# Patient Record
Sex: Female | Born: 1945
Health system: Southern US, Community
[De-identification: ages and names within clinical notes are randomized; demographics above are authoritative.]

## PROBLEM LIST (undated history)

## (undated) DIAGNOSIS — I10 Essential (primary) hypertension: Secondary | ICD-10-CM

## (undated) DIAGNOSIS — E785 Hyperlipidemia, unspecified: Secondary | ICD-10-CM

## (undated) DIAGNOSIS — M810 Age-related osteoporosis without current pathological fracture: Secondary | ICD-10-CM

## (undated) HISTORY — PX: ABDOMINAL HYSTERECTOMY: SHX81

## (undated) HISTORY — DX: Essential (primary) hypertension: I10

## (undated) HISTORY — DX: Hyperlipidemia, unspecified: E78.5

## (undated) HISTORY — DX: Age-related osteoporosis without current pathological fracture: M81.0

---

## 1999-11-05 ENCOUNTER — Encounter: Payer: Self-pay | Admitting: Family Medicine

## 1999-11-05 ENCOUNTER — Encounter: Admission: RE | Admit: 1999-11-05 | Discharge: 1999-11-05 | Payer: Self-pay | Admitting: Family Medicine

## 2001-02-09 ENCOUNTER — Encounter: Payer: Self-pay | Admitting: Surgery

## 2001-02-09 ENCOUNTER — Ambulatory Visit (HOSPITAL_COMMUNITY): Admission: RE | Admit: 2001-02-09 | Discharge: 2001-02-09 | Payer: Self-pay | Admitting: Surgery

## 2007-08-06 ENCOUNTER — Encounter: Admission: RE | Admit: 2007-08-06 | Discharge: 2007-08-06 | Payer: Self-pay | Admitting: Obstetrics and Gynecology

## 2016-09-25 ENCOUNTER — Encounter: Payer: Self-pay | Admitting: Family Medicine

## 2016-09-25 ENCOUNTER — Ambulatory Visit (INDEPENDENT_AMBULATORY_CARE_PROVIDER_SITE_OTHER): Payer: Medicare Other | Admitting: Family Medicine

## 2016-09-25 VITALS — BP 150/100 | HR 72 | Wt 173.0 lb

## 2016-09-25 DIAGNOSIS — I10 Essential (primary) hypertension: Secondary | ICD-10-CM

## 2016-09-25 DIAGNOSIS — Z7689 Persons encountering health services in other specified circumstances: Secondary | ICD-10-CM

## 2016-09-25 HISTORY — DX: Essential (primary) hypertension: I10

## 2016-09-25 LAB — CBC WITH DIFFERENTIAL/PLATELET
Basophils Absolute: 0 cells/uL (ref 0–200)
Basophils Relative: 0 %
Eosinophils Absolute: 141 cells/uL (ref 15–500)
Eosinophils Relative: 3 %
HCT: 43.1 % (ref 35.0–45.0)
Hemoglobin: 14.2 g/dL (ref 11.7–15.5)
Lymphocytes Relative: 28 %
Lymphs Abs: 1316 cells/uL (ref 850–3900)
MCH: 26.8 pg — ABNORMAL LOW (ref 27.0–33.0)
MCHC: 32.9 g/dL (ref 32.0–36.0)
MCV: 81.5 fL (ref 80.0–100.0)
MPV: 11.5 fL (ref 7.5–12.5)
Monocytes Absolute: 188 cells/uL — ABNORMAL LOW (ref 200–950)
Monocytes Relative: 4 %
Neutro Abs: 3055 cells/uL (ref 1500–7800)
Neutrophils Relative %: 65 %
Platelets: 186 10*3/uL (ref 140–400)
RBC: 5.29 MIL/uL — ABNORMAL HIGH (ref 3.80–5.10)
RDW: 14.6 % (ref 11.0–15.0)
WBC: 4.7 10*3/uL (ref 4.0–10.5)

## 2016-09-25 LAB — COMPLETE METABOLIC PANEL WITH GFR
ALT: 6 U/L (ref 6–29)
AST: 11 U/L (ref 10–35)
Albumin: 4.4 g/dL (ref 3.6–5.1)
Alkaline Phosphatase: 57 U/L (ref 33–130)
BUN: 14 mg/dL (ref 7–25)
CO2: 27 mmol/L (ref 20–31)
Calcium: 11.1 mg/dL — ABNORMAL HIGH (ref 8.6–10.4)
Chloride: 107 mmol/L (ref 98–110)
Creat: 1.09 mg/dL — ABNORMAL HIGH (ref 0.60–0.93)
GFR, Est African American: 59 mL/min — ABNORMAL LOW (ref >=60)
GFR, Est Non African American: 51 mL/min — ABNORMAL LOW (ref >=60)
Glucose, Bld: 93 mg/dL (ref 65–99)
Potassium: 4.2 mmol/L (ref 3.5–5.3)
Sodium: 144 mmol/L (ref 135–146)
Total Bilirubin: 0.9 mg/dL (ref 0.2–1.2)
Total Protein: 7.6 g/dL (ref 6.1–8.1)

## 2016-09-25 LAB — POCT URINALYSIS DIPSTICK
Bilirubin, UA: NEGATIVE
Blood, UA: NEGATIVE
Glucose, UA: NEGATIVE
Ketones, UA: NEGATIVE
Leukocytes, UA: NEGATIVE
Nitrite, UA: NEGATIVE
Protein, UA: NEGATIVE
Spec Grav, UA: >=1.03
Urobilinogen, UA: NEGATIVE
pH, UA: 6

## 2016-09-25 MED ORDER — LISINOPRIL-HYDROCHLOROTHIAZIDE 10-12.5 MG PO TABS: 1.0000 | ORAL_TABLET | Freq: Every day | ORAL | 1 refills | Status: DC

## 2016-09-25 NOTE — Progress Notes (Signed)
   Subjective:    Patient ID: Tracy Rowe, female    DOB: 11-03-1945, 71 y.o.   MRN: [id number]  HPI Chief Complaint  Patient presents with  . new pt    new pt, get established.    She is new to the practice and here to establish care.   Previous medical care: no PCP in more than 10 years.  States she saw Dr. Doylene Canard, cardiologist, in the distant past for HTN.  States she has a history of HTN and took medication for approximately 6 years and then stopped for no particular reason. No medication in the past 5-6 years.   Denies fever, chills, unexplained, fatigue, headaches, dizziness, chest pain, palpitations, shortness of breath, orthopnea, cough, abdominal, N/V/D, constipation, urinary symptoms. No LE edema.   Surgeries: hysterectomy for heavy periods in her 105s.   Denies smoking, alcohol use, drug use.   Married and lives with spouse. She is his caregiver.  Never had children. 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 Stopped working last year to take care of her husband.   Colonoscopy: never Mammogram: 17-18 years ago.   Eye exam: 2016  Reviewed allergies, medications, past medical, surgical, and social history.   Review of Systems Pertinent positives and negatives in the history of present illness.     Objective:   Physical Exam BP (!) 150/100   Pulse 72   Wt 173 lb (78.5 kg)   SpO2 98%  Alert and in no distress.  Pharyngeal area is normal. Neck is supple without adenopathy or thyromegaly. Cardiac exam shows a regular sinus rhythm without murmurs or gallops. Lungs are clear to auscultation. Extremities without edema, pulses normal.       Assessment & Plan:  Hypertension, unspecified type - Plan: CBC with Differential/Platelet, COMPLETE METABOLIC PANEL WITH GFR, EKG 12-Lead, POCT urinalysis dipstick, lisinopril-hydrochlorothiazide (PRINZIDE,ZESTORETIC) 10-12.5 MG tablet  Encounter to establish care  Urinalysis negative.  Start taking medication and watch  sodium intake. DASH diet provided. Start walking for exercise.  She will check her BP at home daily for the next 2 weeks and call me with her readings.  She is overdue on health maintenance such as mammogram, DEXA and colonoscopy. She has never had a medicare wellness visit.  She will call and schedule an AWV and fasting lipids in the next month.  Labs ordered today and will follow up pending results.

## 2016-09-25 NOTE — Patient Instructions (Addendum)
Start blood pressure medication and check your blood pressures daily for the next 2 weeks and call me with your readings.  Follow up in 1 month or sooner for high blood pressure.   Schedule an annual wellness visit and fasting lipids within the next month.    DASH Eating Plan DASH stands for "Dietary Approaches to Stop Hypertension." The DASH eating plan is a healthy eating plan that has been shown to reduce high blood pressure (hypertension). Additional health benefits may include reducing the risk of type 2 diabetes mellitus, heart disease, and stroke. The DASH eating plan may also help with weight loss. What do I need to know about the DASH eating plan? For the DASH eating plan, you will follow these general guidelines:  Choose foods with less than 150 milligrams of sodium per serving (as listed on the food label).  Use salt-free seasonings or herbs instead of table salt or sea salt.  Check with your health care provider or pharmacist before using salt substitutes.  Eat lower-sodium products. These are often labeled as "low-sodium" or "no salt added."  Eat fresh foods. Avoid eating a lot of canned foods.  Eat more vegetables, fruits, and low-fat dairy products.  Choose whole grains. Look for the word "whole" as the first word in the ingredient list.  Choose fish and skinless chicken or [place] more often than red meat. Limit fish, poultry, and meat to 6 oz (170 g) each day.  Limit sweets, desserts, sugars, and sugary drinks.  Choose heart-healthy fats.  Eat more home-cooked food and less restaurant, buffet, and fast food.  Limit fried foods.  Do not fry foods. Cook foods using methods such as baking, boiling, grilling, and broiling instead.  When eating at a restaurant, ask that your food be prepared with less salt, or no salt if possible. What foods can I eat? Seek help from a dietitian for individual calorie needs. Grains  Whole grain or whole wheat bread. Brown rice.  Whole grain or whole wheat pasta. Quinoa, bulgur, and whole grain cereals. Low-sodium cereals. Corn or whole wheat flour tortillas. Whole grain cornbread. Whole grain crackers. Low-sodium crackers. Vegetables  Fresh or frozen vegetables (raw, steamed, roasted, or grilled). Low-sodium or reduced-sodium tomato and vegetable juices. Low-sodium or reduced-sodium tomato sauce and paste. Low-sodium or reduced-sodium canned vegetables. Fruits  All fresh, canned (in natural juice), or frozen fruits. Meat and Other Protein Products  Ground beef (85% or leaner), grass-fed beef, or beef trimmed of fat. Skinless chicken or [place]. Ground chicken or [place]. Pork trimmed of fat. All fish and seafood. Eggs. Dried beans, peas, or lentils. Unsalted nuts and seeds. Unsalted canned beans. Dairy  Low-fat dairy products, such as skim or 1% milk, 2% or reduced-fat cheeses, low-fat ricotta or cottage cheese, or plain low-fat yogurt. Low-sodium or reduced-sodium cheeses. Fats and Oils  Tub margarines without trans fats. Light or reduced-fat mayonnaise and salad dressings (reduced sodium). Avocado. Safflower, olive, or canola oils. Natural peanut or almond butter. Other  Unsalted popcorn and pretzels. The items listed above may not be a complete list of recommended foods or beverages. Contact your dietitian for more options.  What foods are not recommended? Grains  White bread. White pasta. White rice. Refined cornbread. Bagels and croissants. Crackers that contain trans fat. Vegetables  Creamed or fried vegetables. Vegetables in a cheese sauce. Regular canned vegetables. Regular canned tomato sauce and paste. Regular tomato and vegetable juices. Fruits  Canned fruit in light or heavy syrup. Fruit juice. Meat  and Other Protein Products  Fatty cuts of meat. Ribs, chicken wings, bacon, sausage, bologna, salami, chitterlings, fatback, hot dogs, bratwurst, and packaged luncheon meats. Salted nuts and seeds. Canned beans  with salt. Dairy  Whole or 2% milk, cream, half-and-half, and cream cheese. Whole-fat or sweetened yogurt. Full-fat cheeses or blue cheese. Nondairy creamers and whipped toppings. Processed cheese, cheese spreads, or cheese curds. Condiments  Onion and garlic salt, seasoned salt, table salt, and sea salt. Canned and packaged gravies. Worcestershire sauce. Tartar sauce. Barbecue sauce. Teriyaki sauce. Soy sauce, including reduced sodium. Steak sauce. Fish sauce. Oyster sauce. Cocktail sauce. Horseradish. Ketchup and mustard. Meat flavorings and tenderizers. Bouillon cubes. Hot sauce. Tabasco sauce. Marinades. Taco seasonings. Relishes. Fats and Oils  Butter, stick margarine, lard, shortening, ghee, and bacon fat. Coconut, palm kernel, or palm oils. Regular salad dressings. Other  Pickles and olives. Salted popcorn and pretzels. The items listed above may not be a complete list of foods and beverages to avoid. Contact your dietitian for more information.  Where can I find more information? National Heart, Lung, and Blood Institute: travelstabloid.com This information is not intended to replace advice given to you by your health care provider. Make sure you discuss any questions you have with your health care provider. Document Released: 07/31/2011 Document Revised: 01/17/2016 Document Reviewed: 06/15/2013 Elsevier Interactive Patient Education  2017 [place].  Menopause Menopause is the normal time of life when menstrual periods stop completely. Menopause is complete when you have missed 12 consecutive menstrual periods. It usually occurs between the ages of 11 years and 23 years. Very rarely does a woman develop menopause before the age of 14 years. At menopause, your ovaries stop producing the female hormones estrogen and progesterone. This can cause undesirable symptoms and also affect your health. Sometimes the symptoms may occur 4-5 years before the menopause  begins. There is no relationship between menopause and:  Oral contraceptives.  Number of children you had.  Race.  The age your menstrual periods started (menarche). Heavy smokers and very thin women may develop menopause earlier in life. What are the causes?  The ovaries stop producing the female hormones estrogen and progesterone. Other causes include:  Surgery to remove both ovaries.  The ovaries stop functioning for no known reason.  Tumors of the pituitary gland in the brain.  Medical disease that affects the ovaries and hormone production.  Radiation treatment to the abdomen or pelvis.  Chemotherapy that affects the ovaries. What are the signs or symptoms?  Hot flashes.  Night sweats.  Decrease in sex drive.  Vaginal dryness and thinning of the vagina causing painful intercourse.  Dryness of the skin and developing wrinkles.  Headaches.  Tiredness.  Irritability.  Memory problems.  Weight gain.  Bladder infections.  Hair growth of the face and chest.  Infertility. More serious symptoms include:  Loss of bone (osteoporosis) causing breaks (fractures).  Depression.  Hardening and narrowing of the arteries (atherosclerosis) causing heart attacks and strokes. How is this diagnosed?  When the menstrual periods have stopped for 12 straight months.  Physical exam.  Hormone studies of the blood. How is this treated? There are many treatment choices and nearly as many questions about them. The decisions to treat or not to treat menopausal changes is an individual choice made with your health care provider. Your health care provider can discuss the treatments with you. Together, you can decide which treatment will work best for you. Your treatment choices may include:  Hormone therapy (  estrogen and progesterone).  Non-hormonal medicines.  Treating the individual symptoms with medicine (for example antidepressants for depression).  Herbal  medicines that may help specific symptoms.  Counseling by a psychiatrist or psychologist.  Group therapy.  Lifestyle changes including:  Eating healthy.  Regular exercise.  Limiting caffeine and alcohol.  Stress management and meditation.  No treatment. Follow these instructions at home:  Take the medicine your health care provider gives you as directed.  Get plenty of sleep and rest.  Exercise regularly.  Eat a diet that contains calcium (good for the bones) and soy products (acts like estrogen hormone).  Avoid alcoholic beverages.  Do not smoke.  If you have hot flashes, dress in layers.  Take supplements, calcium, and vitamin D to strengthen bones.  You can use over-the-counter lubricants or moisturizers for vaginal dryness.  Group therapy is sometimes very helpful.  Acupuncture may be helpful in some cases. Contact a health care provider if:  You are not sure you are in menopause.  You are having menopausal symptoms and need advice and treatment.  You are still having menstrual periods after age 75 years.  You have pain with intercourse.  Menopause is complete (no menstrual period for 12 months) and you develop vaginal bleeding.  You need a referral to a specialist (gynecologist, psychiatrist, or psychologist) for treatment. Get help right away if:  You have severe depression.  You have excessive vaginal bleeding.  You fell and think you have a broken bone.  You have pain when you urinate.  You develop leg or chest pain.  You have a fast pounding heart beat (palpitations).  You have severe headaches.  You develop vision problems.  You feel a lump in your breast.  You have abdominal pain or severe indigestion. This information is not intended to replace advice given to you by your health care provider. Make sure you discuss any questions you have with your health care provider. Document Released: 11/01/2003 Document Revised: 01/17/2016  Document Reviewed: 03/10/2013 Elsevier Interactive Patient Education  2017 [employer].

## 2016-10-23 ENCOUNTER — Encounter: Payer: Self-pay | Admitting: Family Medicine

## 2016-10-23 ENCOUNTER — Ambulatory Visit (INDEPENDENT_AMBULATORY_CARE_PROVIDER_SITE_OTHER): Payer: Medicare Other | Admitting: Family Medicine

## 2016-10-23 VITALS — BP 130/72 | HR 74 | Ht 68.5 in | Wt 168.0 lb

## 2016-10-23 DIAGNOSIS — Z13220 Encounter for screening for lipoid disorders: Secondary | ICD-10-CM | POA: Diagnosis not present

## 2016-10-23 DIAGNOSIS — Z23 Encounter for immunization: Secondary | ICD-10-CM

## 2016-10-23 DIAGNOSIS — Z206 Contact with and (suspected) exposure to human immunodeficiency virus [HIV]: Secondary | ICD-10-CM

## 2016-10-23 DIAGNOSIS — Z1159 Encounter for screening for other viral diseases: Secondary | ICD-10-CM | POA: Diagnosis not present

## 2016-10-23 DIAGNOSIS — Z1231 Encounter for screening mammogram for malignant neoplasm of breast: Secondary | ICD-10-CM

## 2016-10-23 DIAGNOSIS — I10 Essential (primary) hypertension: Secondary | ICD-10-CM | POA: Diagnosis not present

## 2016-10-23 DIAGNOSIS — Z1211 Encounter for screening for malignant neoplasm of colon: Secondary | ICD-10-CM | POA: Diagnosis not present

## 2016-10-23 DIAGNOSIS — Z0000 Encounter for general adult medical examination without abnormal findings: Secondary | ICD-10-CM

## 2016-10-23 DIAGNOSIS — E2839 Other primary ovarian failure: Secondary | ICD-10-CM | POA: Diagnosis not present

## 2016-10-23 DIAGNOSIS — Z113 Encounter for screening for infections with a predominantly sexual mode of transmission: Secondary | ICD-10-CM

## 2016-10-23 DIAGNOSIS — Z1239 Encounter for other screening for malignant neoplasm of breast: Secondary | ICD-10-CM

## 2016-10-23 LAB — LIPID PANEL
CHOL/HDL RATIO: 5.1 ratio — AB (ref <5.0)
Cholesterol: 190 mg/dL (ref <200)
HDL: 37 mg/dL — AB (ref >50)
LDL Cholesterol: 132 mg/dL — ABNORMAL HIGH (ref <100)
TRIGLYCERIDES: 104 mg/dL (ref <150)
VLDL: 21 mg/dL (ref <30)

## 2016-10-23 NOTE — Progress Notes (Signed)
Tracy Rowe is a 71 y.o. female who presents for annual wellness visit and follow-up on chronic medical conditions.  She has the following concerns: No concerns today.   Her husband has HIV. Will need this checked today.   Immunization History  Administered Date(s) Administered  . Influenza, High Dose Seasonal PF 10/23/2016  . Pneumococcal Conjugate-13 10/23/2016   Last Pap smear: hysterectomy Last mammogram: 2008 Last colonoscopy: never  Last DEXA: 2008 Dentist: many years - has upper dentures.  Ophtho: [facility] in 2017. Prescription glasses.   Exercise: not currently.   Other doctors caring for patient include: NONE    Depression screen:  See questionnaire below.  Depression screen PHQ 2/9 10/23/2016  Decreased Interest 0  Down, Depressed, Hopeless 0  PHQ - 2 Score 0    Fall Risk Screen: see questionnaire below. Fall Risk  10/23/2016  Falls in the past year? No    ADL screen:  See questionnaire below Functional Status Survey: Is the patient deaf or have difficulty hearing?: No Does the patient have difficulty seeing, even when wearing glasses/contacts?: No Does the patient have difficulty concentrating, remembering, or making decisions?: No Does the patient have difficulty walking or climbing stairs?: No Does the patient have difficulty dressing or bathing?: No Does the patient have difficulty doing errands alone such as visiting a doctor's office or shopping?: No   End of Life Discussion:  Patient does not have  a living will and medical power of attorney. Paperwork given and will take home to review. Answered questions. Her niece will be her designated HPOA.   Review of Systems Constitutional: -fever, -chills, -sweats, -unexpected weight change, -anorexia, -fatigue Allergy: -sneezing, -itching, -congestion Dermatology: denies changing moles, rash, lumps, new worrisome lesions ENT: -runny nose, -ear pain, -sore throat, -hoarseness, -sinus pain, -teeth pain,  -tinnitus, -hearing loss Cardiology:  -chest pain, -palpitations, -edema, -orthopnea, -paroxysmal nocturnal dyspnea Respiratory: -cough, -shortness of breath, -dyspnea on exertion, -wheezing, -hemoptysis Gastroenterology: -abdominal pain, -nausea, -vomiting, -diarrhea, -constipation, -blood in stool, -changes in bowel movement, -dysphagia Hematology: -bleeding or bruising problems Musculoskeletal: -arthralgias, -myalgias, -joint swelling, -back pain, -neck pain, -cramping, -gait changes Ophthalmology: -vision changes, -eye redness, -itching, -discharge Urology: -dysuria, -difficulty urinating, -hematuria, -urinary frequency, -urgency, incontinence Neurology: -headache, -weakness, -tingling, -numbness, -speech abnormality, -memory loss, -falls, -dizziness Psychology:  -depressed mood, -agitation, -sleep problems    PHYSICAL EXAM:  BP 130/72   Pulse 74   Ht 5' 8.5" (1.74 m)   Wt 168 lb (76.2 kg)   BMI 25.17 kg/m   General Appearance: Alert, cooperative, no distress, appears stated age Head: Normocephalic, without obvious abnormality, atraumatic Eyes: PERRL, conjunctiva/corneas clear, EOM's intact, fundi benign Ears: Normal TM's and external ear canals Nose: Nares normal, mucosa normal, no drainage or sinus  tenderness Throat: Lips, mucosa, and tongue normal; teeth and gums normal Neck: Supple, no lymphadenopathy; thyroid: no enlargement/tenderness/nodules; no carotid bruit or JVD Back: Spine nontender, no curvature, ROM normal, no CVA tenderness Lungs: Clear to auscultation bilaterally without wheezes, rales or ronchi; respirations unlabored Chest Wall: No tenderness or deformity Heart: Regular rate and rhythm, S1 and S2 normal, no murmur, rub or gallop Breast Exam: No tenderness, masses, or nipple discharge or inversion. No axillary lymphadenopathy Abdomen: Soft, non-tender, nondistended, normoactive bowel sounds, no masses, no hepatosplenomegaly Genitalia: Refused.   Pap not  indicated- hysterectomy.  Rectal: refused  Extremities: No clubbing, cyanosis or edema Pulses: 2+ and symmetric all extremities Skin: Skin color, texture, turgor normal, no rashes or lesions Lymph nodes: Cervical,  supraclavicular, and axillary nodes normal Neurologic: CNII-XII intact, normal strength, sensation and gait; reflexes 2+ and symmetric throughout Psych: Normal mood, affect, hygiene and grooming.  ASSESSMENT/PLAN: Medicare annual wellness visit, subsequent  Routine general medical examination at a health care facility - Plan: Lipid panel  Essential hypertension  Screening for colon cancer  Screening for breast cancer - Plan: MM DIGITAL SCREENING BILATERAL  Need for Tdap vaccination  Need for vaccination against Streptococcus pneumoniae - Plan: Pneumococcal conjugate vaccine 13-valent  Need for hepatitis C screening test - Plan: Hepatitis C antibody  History of exposure to HIV - Plan: RPR, HIV antibody  Estrogen deficiency - Plan: VITAMIN D 25 Hydroxy (Vit-D Deficiency, Fractures), DG Bone Density  Screening for STD (sexually transmitted disease) - Plan: RPR  Need for influenza vaccination - Plan: Flu vaccine HIGH DOSE PF  Screening for lipid disorders - Plan: Lipid panel  Discussed that her blood pressure is under control and within goal. Continue taking medication. She appears to be doing well. She is the caregiver for her husband. Her husband is HIV positive and she has not been checked for this in many years. HIV and RPR ordered today.  She has not been sexually active in many years and denies need for additional STD testing.  Discussed safety and health promotion.  Recommend a dental exam since she does still have bottom teeth and has not had a cleaning in years.  Has not had a mammogram or bone density in at least 10 years. These are ordered.  Has never had a colonoscopy and refuses today. She is of average risk and agrees to do the Cologuard test.  One time  Hepatitis C ordered per age group.  Hysterectomy in the past, she thinks it was a total but cannot find documentation on this. Will check vitamin D status.  Flu shot given.  prevnar 13 given.  Tdap prescription sent to pharmacy.  Advance directives given and she will take these home to fill out and return with any questions.  Follow up in 6 months pending lab and other results.   Discussed monthly self breast exams and yearly mammograms; at least 30 minutes of aerobic activity at least 5 days/week and weight-bearing exercise 2x/week; proper sunscreen use reviewed; healthy diet, including goals of calcium and vitamin D intake and alcohol recommendations (less than or equal to 1 drink/day) reviewed; regular seatbelt use; changing batteries in smoke detectors.  Immunization recommendations discussed.  Colonoscopy recommendations reviewed   Medicare Attestation I have personally reviewed: The patient's medical and social history Their use of alcohol, tobacco or illicit drugs Their current medications and supplements The patient's functional ability including ADLs,fall risks, home safety risks, cognitive, and hearing and visual impairment Diet and physical activities Evidence for depression or mood disorders  The patient's weight, height, and BMI have been recorded in the chart.  I have made referrals, counseling, and provided education to the patient based on review of the above and I have provided the patient with a written personalized care plan for preventive services.     Harland Dingwall, NP   10/23/2016

## 2016-10-23 NOTE — Patient Instructions (Addendum)
MEDICARE PREVENTATIVE SERVICES (FEMALE) AND PERSONALIZED PLAN for Tracy Rowe October 23, 2016  CONDITIONS OR RISKS IDENTIFIED TODAY: Need for dental cleaning exam.  Start taking an 81 mg Aspirin nightly for heart health.   Need for advance directives.    SPECIFIC RECOMMENDATIONS: Call and schedule a dental exam.  Get your Tdap shot at your local pharmacy.  Fill out your advance directives and bring them in to go over and scan into the chart.  Call and schedule your bone density and mammogram.  Do the cologuard screening test for colon cancer.    Influenza vaccine: given today Pneumococcal vaccine: prevnar 13 given today. You will get the 2nd one next year.  Shingles vaccine: not done Tdap vaccine: prescription given Colonoscopy: cologuard ordered Mammogram: ordered today Pap smear: N/A  Take a Aspirin 81mg  nightly for heart health Get at least 150 minutes of exercise per week. Eat a healthy diet with fruits and vegetables and limit fried and fatty foods.   Return 6 months or sooner pending labs and other results.     GENERAL RECOMMENDATIONS FOR GOOD HEALTH:  Supplements:  . Take a daily baby Aspirin 81mg  at bedtime for heart health unless you have a history of gastrointestinal bleed, allergy to aspirin, or are already taking higher dose Aspirin or other antiplatelet or blood thinner medication.   . Consume 1200 mg of Calcium daily through dietary calcium or supplement if you are female age 8 or older, or men 14 and older.   Men aged 34-70 should consume 1000 mg of Calcium daily. . Take 600 IU of Vitamin D daily.  Take 800 IU of Calcium daily if you are older than age 42.  . Take a general multivitamin daily.   Healthy diet: Eat a variety of foods, including fruits, vegetables, vegetable protein such as beans, lentils, tofu, and grains, such as rice.  Limit meat or animal protein, but if you eat meat, choose leans cuts such as chicken, fish, or [place].  Drink plenty of water  daily.  Decrease saturated fat in the diet, avoid lots of red meat, processed foods, sweets, fast foods, and fried foods.  Limit salt and caffeine intake.  Exercise: Aerobic exercise helps maintain good heart health. Weight bearing exercise helps keep bones and muscles working strong.  We recommend at least 30-40 minutes of exercise most days of the week.   Fall prevention: Falls are the leading cause of injuries, accidents, and accidental deaths in people over the age of 60. Falling is a real threat to your ability to live on your own.  Causes include poor eyesight or poor hearing, illness, poor lighting, throw rugs, clutter in your home, and medication side effects causing dizziness or balance problems.  Such medications can include medications for depression, sleep problems, high blood pressure, diabetes, and heart conditions.   PREVENTION  Be sure your home is as safe as possible. Here are some tips:  Wear shoes with non-skid soles (not house slippers).   Be sure your home and outside area are well lit.   Use night lights throughout your house, including hallways and stairways.   Remove clutter and clean up spills on floors and walkways.   Remove throw rugs or fasten them to the floor with carpet tape. Tack down carpet edges.   Do not place electrical cords across pathways.   Install grab bars in your bathtub, shower, and toilet area. Towel bars should not be used as a grab bar.   Install handrails  on both sides of stairways.   Do not climb on stools or stepladders. Get someone else to help with jobs that require climbing.   Do not wax your floors at all, or use a non-skid wax.   Repair uneven or unsafe sidewalks, walkways or stairs.   Keep frequently used items within reach.   Be aware of pets so you do not trip.  Get regular check-ups from your doctor, and take good care of yourself:  Have your eyes checked every year for vision changes, cataracts, glaucoma, and other eye  problems. Wear eyeglasses as directed.   Have your hearing checked every 2 years, or anytime you or others think that you cannot hear well. Use hearing aids as directed.   See your caregiver if you have foot pain or corns. Sore feet can contribute to falls.   Let your caregiver know if a medicine is making you feel dizzy or making you lose your balance.   Use a cane, [place], or wheelchair as directed. Use [place] or wheelchair brakes when getting in and out.   When you get up from bed, sit on the side of the bed for 1 to 2 minutes before you stand up. This will give your blood pressure time to adjust, and you will feel less dizzy.   If you need to go to the bathroom often, consider using a bedside commode.  Disease prevention:  If you smoke or chew tobacco, find out from your caregiver how to quit. It can literally save your life, no matter how long you have been a tobacco user. If you do not use tobacco, never begin. Medicare does cover some smoking cessation counseling.  Maintain a healthy diet and normal weight. Increased weight leads to problems with blood pressure and diabetes. We check your height, weight, and BMI as part of your yearly visit.  The Body Mass Index or BMI is a way of measuring how much of your body is fat. Having a BMI above 27 increases the risk of heart disease, diabetes, hypertension, stroke and other problems related to obesity. Your caregiver can help determine your BMI and based on it develop an exercise and dietary program to help you achieve or maintain this important measurement at a healthful level.  High blood pressure causes heart and blood vessel problems.  Persistent high blood pressure should be treated with medicine if weight loss and exercise do not work.  We check your blood pressure as part of your yearly visit.  Avoid drinking alcohol in excess (more than two drinks per day).  Avoid use of street drugs. Do not share needles with anyone. Ask for  professional help if you need assistance or instructions on stopping the use of alcohol, cigarettes, and/or drugs.  Brush your teeth twice a day with fluoride toothpaste, and floss once a day. Good oral hygiene prevents tooth decay and gum disease. The problems can be painful, unattractive, and can cause other health problems. Visit your dentist for a routine oral and dental checkup and preventive care every 6-12 months.   See your eye doctor yearly for routine screening for things like glaucoma.  Look at your skin regularly.  Use a mirror to look at your back. Notify your caregivers of changes in moles, especially if there are changes in shapes, colors, a size larger than a pencil eraser, an irregular border, or development of new moles.  Safety:  Use seatbelts 100% of the time, whether driving or as a passenger.  Use  safety devices such as hearing protection if you work in environments with loud noise or significant background noise.  Use safety glasses when doing any work that could send debris in to the eyes.  Use a helmet if you ride a bike or motorcycle.  Use appropriate safety gear for contact sports.  Talk to your caregiver about gun safety.  Use sunscreen with a SPF (or skin protection factor) of 15 or greater.  Lighter skinned people are at a greater risk of skin cancer. Don't forget to also wear sunglasses in order to protect your eyes from too much damaging sunlight. Damaging sunlight can accelerate cataract formation.   If you have multiple sexual partners, or if you are not in a monogamous relationship, practice safe sex. Use condoms. Condoms are used to help reduce the spread of sexually transmitted infections (or STIs).  Consider an HIV test if you have never been tested.  Consider routine screening for STIs if you have multiple sexual partners.   Keep carbon monoxide and smoke detectors in your home functioning at all times. Change the batteries every 6 months or use a model that  plugs into the wall or is hard wired in.   END OF LIFE PLANNING/ADVANCED DIRECTIVES Advance health-care planning is deciding the kind of care you want at the end of life. While alert competent adults are able to exercise their rights to make health care and financial decisions, problems arise when an individual becomes unconscious, incapacitated, or otherwise unable to communicate or make such decisions. Advance health care directives are the legal documents in which you give written instructions about your choices limited, aggressive or palliative care if, in the future, you cannot speak for yourself.  Advanced directives include the following: [place] allows you to appoint someone to act as your health care agent to make health care decisions for you should it be determined by your health care provider that you are no longer able to make these decisions for yourself.  A Living Will is a legal document in which you can declare that under certain conditions you desire your life not be prolonged by extraordinary or artificial means during your last illness or when you are near death. We can provide you with sample advanced directives, you can get an attorney to prepare these for you, or you can visit [place] Secretary of State's website for additional information and resources at [email or web address]  Further, I recommend you have an attorney prepare a Will and Durable Power of Attorney if you haven't done so already.  Please get [place] a copy of your health care Advanced Directives.   PREVENTATIV E CARE RECOMMENDATIONS:  Vaccinations: We recommend the following vaccinations as part of your preventative care:  Pneumococcal vaccine is recommended to protect against certain types of pneumonia.  This is normally recommended for adults age 82 or older once, or up to every 5 years for those at high risk.  The vaccine is also recommended for adults younger than 73 years  old with certain underlying conditions that make them high risk for pneumonia.  Influenza vaccine is recommended to protect against seasonal influenza or "the flu." Influenza is a serious disease that can lead to hospitalization and sometimes even death. Traditional flu vaccines (called trivalent vaccines) are made to protect against three flu viruses; an influenza A (H1N1) virus, an influenza A (H3N2) virus, and an influenza B virus. In addition, there are flu vaccines made to protect against four flu viruses (  called "quadrivalent" vaccines). These vaccines protect against the same viruses as the trivalent vaccine and an additional B virus.  We recommend the high dose influenza vaccine to those 65 years and older.  Hepatitis B vaccine to protect against a form of infection of the liver by a virus acquired from blood or body fluids, particularly for high risk groups.  Td or Tdap vaccine to protect against Tetanus, diphtheria and pertussis which can be very serious.  These diseases are caused by bacteria.  Diphtheria and pertussis are spread from person to person through coughing or sneezing.  Tetanus enters the body through cuts, scratches, or wounds.  Tetanus (Lockjaw) causes painful muscle tightening and stiffness, usually all over the body.  Diphtheria can cause a thick coating to form in the back of the throat.  It can lead to breathing problems, paralysis, heart failure, and death.  Pertussis (Whooping Cough) causes severe coughing spells, which can cause difficulty breathing, vomiting and disturbed sleep.  Td or Tdap is usually given every 10 years.  Shingles vaccine to protect against Varicella Zoster if you are older than age 108, or younger than 73 years old with certain underlying illness.    Cancer Screening: Most routine colon cancer screening begins at the age of 44.  Subsequent colonoscopies are performed either every 5-10 years for normal screening, or every 2-5 years for higher risks  patients, up until age 15 years of age. Annual screening is done with easy to use take-home tests to check for hidden blood in the stool called hemoccult tests.  Sigmoidoscopy or colonoscopy can detect the earliest forms of colon cancer and is life saving. These tests use a small camera at the end of a tube to directly examine the colon.   Pelvic Exam and Pap Smear: Pelvic exams and pap smears are performed routinely to evaluate for abnormalities as well as cancers including cervical and vaginal cancers.  This is generally performed every 2-3 years for most women, or more frequently for higher risk patients.  Mammograms: Mammograms are used to screen for breast cancer.  Medicare covers baseline screening once from ages 30-41 years old, but will cover mammograms yearly for those 40 years and older.  In accordance with other guidelines, you may not need a mammogram every year though.  The decision on how frequently you need a mammogram should be discussed with you medical provider.    Osteoporosis Screening: Screening for osteoporosis usually begins at age 62 for women, and can be done as frequent as every 2 years.  However, women or men with higher risk of osteoporosis may be screened earlier than age 5.  Osteoporosis or low bone mass is diminished bone strength from alterations in bone architecture leading to bone fragility and increased fracture risk.     Cardiovascular Screening: Fat and cholesterol leaves deposits in your arteries that can block them. This causes heart disease and vessel disease elsewhere in your body.  If your cholesterol is found to be high, or if you have heart disease or certain other medical conditions, then you may need to have your cholesterol monitored frequently and be treated with medication. Cardiovascular screening in the form of lab tests for cholesterol, HDL and triglycerides can be done every 5 years.  A screening electrocardiogram can be done as part of the Welcome to  Medicare physical.  Diabetes Screening: Diabetes screening can be done at least every 3 years for those with risk factors,  or every 6-60months for prediabetic patients.  Screening includes fasting blood sugar test or glucose tolerance test.  Risk factors include hypertension, dyslipidemia, obesity, previously abnormal glucose tests, family history of diabetes, age 33 years or older, and history of gestations diabetes.   AAA (abdominal aortic aneurysm) Screening: Medicare allows for a one time ultrasound to screen for abdominal aortic aneurysm if done as a referral as part of the Welcome to Medicare exam.  Men eligible for this screening include those men between age 54-21 years of age who have smoked at least 100 cigarettes in his lifetime and/or has a family history of AAA.  HIV Screening:  Medicare allows for yearly screening for patients at high risk for contracting HIV disease.

## 2016-10-24 ENCOUNTER — Other Ambulatory Visit: Payer: Self-pay | Admitting: Family Medicine

## 2016-10-24 ENCOUNTER — Encounter: Payer: Self-pay | Admitting: Family Medicine

## 2016-10-24 DIAGNOSIS — I10 Essential (primary) hypertension: Secondary | ICD-10-CM

## 2016-10-24 DIAGNOSIS — E559 Vitamin D deficiency, unspecified: Secondary | ICD-10-CM

## 2016-10-24 LAB — VITAMIN D 25 HYDROXY (VIT D DEFICIENCY, FRACTURES): Vit D, 25-Hydroxy: 10 ng/mL — ABNORMAL LOW (ref 30–100)

## 2016-10-24 LAB — RPR

## 2016-10-24 LAB — HEPATITIS C ANTIBODY: HCV Ab: NEGATIVE

## 2016-10-24 LAB — HIV ANTIBODY (ROUTINE TESTING W REFLEX): HIV 1&2 Ab, 4th Generation: NONREACTIVE

## 2016-10-24 MED ORDER — VITAMIN D (ERGOCALCIFEROL) 1.25 MG (50000 UNIT) PO CAPS: 50000.0000 [IU] | ORAL_CAPSULE | ORAL | 0 refills | Status: DC

## 2016-10-24 MED ORDER — LISINOPRIL-HYDROCHLOROTHIAZIDE 10-12.5 MG PO TABS: 1.0000 | ORAL_TABLET | Freq: Every day | ORAL | 5 refills | Status: DC

## 2016-10-31 ENCOUNTER — Encounter: Payer: Self-pay | Admitting: Family Medicine

## 2016-11-19 ENCOUNTER — Ambulatory Visit
Admission: RE | Admit: 2016-11-19 | Discharge: 2016-11-19 | Disposition: A | Discharge status: Home / Self Care | Payer: Medicare Other | Source: Ambulatory Visit | Attending: Family Medicine | Admitting: Family Medicine

## 2016-11-19 DIAGNOSIS — E2839 Other primary ovarian failure: Secondary | ICD-10-CM

## 2016-11-19 DIAGNOSIS — Z1239 Encounter for other screening for malignant neoplasm of breast: Secondary | ICD-10-CM

## 2016-11-20 ENCOUNTER — Encounter: Payer: Self-pay | Admitting: Family Medicine

## 2016-11-20 ENCOUNTER — Ambulatory Visit (INDEPENDENT_AMBULATORY_CARE_PROVIDER_SITE_OTHER): Payer: Medicare Other | Admitting: Family Medicine

## 2016-11-20 ENCOUNTER — Telehealth: Payer: Self-pay | Admitting: Family Medicine

## 2016-11-20 VITALS — BP 132/90 | HR 78 | Wt 170.4 lb

## 2016-11-20 DIAGNOSIS — M810 Age-related osteoporosis without current pathological fracture: Secondary | ICD-10-CM

## 2016-11-20 LAB — COMPREHENSIVE METABOLIC PANEL
ALBUMIN: 4.1 g/dL (ref 3.6–5.1)
ALK PHOS: 50 U/L (ref 33–130)
ALT: 5 U/L — AB (ref 6–29)
AST: 11 U/L (ref 10–35)
BILIRUBIN TOTAL: 0.7 mg/dL (ref 0.2–1.2)
BUN: 22 mg/dL (ref 7–25)
CALCIUM: 11 mg/dL — AB (ref 8.6–10.4)
CO2: 27 mmol/L (ref 20–31)
Chloride: 105 mmol/L (ref 98–110)
Creat: 1.37 mg/dL — ABNORMAL HIGH (ref 0.60–0.93)
Glucose, Bld: 89 mg/dL (ref 65–99)
POTASSIUM: 4.2 mmol/L (ref 3.5–5.3)
Sodium: 143 mmol/L (ref 135–146)
TOTAL PROTEIN: 7.2 g/dL (ref 6.1–8.1)

## 2016-11-20 MED ORDER — POLYETHYLENE GLYCOL 3350 17 GM/SCOOP PO POWD: 17.0000 g | Freq: Two times a day (BID) | ORAL | 1 refills | Status: AC | PRN

## 2016-11-20 NOTE — Progress Notes (Signed)
   Subjective:    Patient ID: Tracy Rowe, female    DOB: [birth year], 71 y.o.   MRN: [id number]  HPI Chief Complaint  Patient presents with  . discuss bone density    discuss bone density   She is here to follow up on abnormal bone density results. She has a T-score of -3.2 at her femur and now is osteoporotic. States she was not aware that her bone density in 2008 showed that she had osteopenia. Denies history of bone fracture.  States she does not have a diet rich in calcium. She is taking vitamin D. Recent vitamin D level was low.   Does not smoke. Does not drink alcohol.   No history of steroid use.   No known family history of osteoporosis.   States she is walking 1/2 mile 3 days per week.   Denies fever, chills, headache, dizziness, chest pain, palpitations, abdominal pain, N/V/D.   ASSESSMENT: The BMD measured at Femur Total Left is 0.606 g/cm2 with a T-score of -3.2. This patient is considered osteoporotic according to [facility]) criteria. L-1, L-4 were excluded due to degenerative changes. Spine was not compared to prior study due to exclusion of vertebral bodies on current exam. There has been a statistically significant decrease in BMD of Left hip since prior exam dated 08/06/2007.  Site Region Measured Date Measured Age YA BMD Significant CHANGE T-score DualFemur Total Left 11/19/2016 72.7 -3.2 0.606 g/cm2 *  AP Spine  L2-L3      11/19/2016    72.7         -2.4    0.921 g/cm2   Review of Systems Pertinent positives and negatives in the history of present illness.     Objective:   Physical Exam BP 132/90   Pulse 78   Wt 170 lb 6.4 oz (77.3 kg)   BMI 25.53 kg/m   Alert and oriented and in no acute distress. Not otherwise examined.      Assessment & Plan:  Age-related osteoporosis without current pathological fracture  Serum calcium elevated - Plan: Comprehensive metabolic panel  Counseled on treatment and management of  osteoporosis. She will pay close attention to her calcium intake. She is currently taking vitamin D 50,000IU and has 3 weeks left. Will repeat her vitamin D afterwards. Discussed weight bearing exercise.  Discussed treatment options with either oral bisphosphonates Vs Prolia injections every 6 months. She would prefer the injections to the pills. States she is concerned about the process of how to take the pills weekly and thinks she would be more successful with the injections. She already has a lot of responsibility caring for her husband and feels stressed now that she has to start taking medication.  Will initiate PA for Prolia.

## 2016-11-20 NOTE — Patient Instructions (Addendum)
Make sure you are getting at least 1,200 mg of calcium daily. Continue on vitamin D.  Continue with walking for exercise. Eventually and slowly work up to 20 minutes every day or 30 minutes 5 days per week.   Start taking miralax (1 cap full per day in 8 ounces of water) and see if this helps your bowels move easier.   We will call you about the medication for osteoporosis.    Osteoporosis Osteoporosis is the thinning and loss of density in the bones. Osteoporosis makes the bones more brittle, fragile, and likely to break (fracture). Over time, osteoporosis can cause the bones to become so weak that they fracture after a simple fall. The bones most likely to fracture are the bones in the hip, wrist, and spine. What are the causes? The exact cause is not known. What increases the risk? Anyone can develop osteoporosis. You may be at greater risk if you have a family history of the condition or have poor nutrition. You may also have a higher risk if you are:  Female.  38 years old or older.  A smoker.  Not physically active.  White or Asian.  Slender. What are the signs or symptoms? A fracture might be the first sign of the disease, especially if it results from a fall or injury that would not usually cause a bone to break. Other signs and symptoms include:  Low back and neck pain.  Stooped posture.  Height loss. How is this diagnosed? To make a diagnosis, your health care provider may:  Take a medical history.  Perform a physical exam.  Order tests, such as:  A bone mineral density test.  A dual-energy X-ray absorptiometry test. How is this treated? The goal of osteoporosis treatment is to strengthen your bones to reduce your risk of a fracture. Treatment may involve:  Making lifestyle changes, such as:  Eating a diet rich in calcium.  Doing weight-bearing and muscle-strengthening exercises.  Stopping tobacco use.  Limiting alcohol intake.  Taking medicine to  slow the process of bone loss or to increase bone density.  Monitoring your levels of calcium and vitamin D. Follow these instructions at home:  Include calcium and vitamin D in your diet. Calcium is important for bone health, and vitamin D helps the body absorb calcium.  Perform weight-bearing and muscle-strengthening exercises as directed by your health care provider.  Do not use any tobacco products, including cigarettes, chewing tobacco, and electronic cigarettes. If you need help quitting, ask your health care provider.  Limit your alcohol intake.  Take medicines only as directed by your health care provider.  Keep all follow-up visits as directed by your health care provider. This is important.  Take precautions at home to lower your risk of falling, such as:  Keeping rooms well lit and clutter free.  Installing safety rails on stairs.  Using rubber mats in the bathroom and other areas that are often wet or slippery. Get help right away if: You fall or injure yourself. This information is not intended to replace advice given to you by your health care provider. Make sure you discuss any questions you have with your health care provider. Document Released: 05/21/2005 Document Revised: 01/14/2016 Document Reviewed: 01/19/2014 Elsevier Interactive Patient Education  2017 [employer].

## 2016-11-20 NOTE — Telephone Encounter (Signed)
Insurance verification faxed for [employer]

## 2016-11-26 ENCOUNTER — Encounter: Payer: Self-pay | Admitting: Family Medicine

## 2016-11-26 ENCOUNTER — Ambulatory Visit (INDEPENDENT_AMBULATORY_CARE_PROVIDER_SITE_OTHER): Payer: Medicare Other | Admitting: Family Medicine

## 2016-11-26 DIAGNOSIS — Z8639 Personal history of other endocrine, nutritional and metabolic disease: Secondary | ICD-10-CM

## 2016-11-26 LAB — TSH: TSH: 2.7 m[IU]/L

## 2016-11-26 NOTE — Progress Notes (Signed)
   Subjective:    Patient ID: Tracy Rowe, female    DOB: 1946/05/24, 71 y.o.   MRN: [id number]  HPI Chief Complaint  Patient presents with  . discuss blood work    discuss blood work   She is here to discuss abnormal blood work. Her serum calcium is elevated.   Review of her chart shows that she had a parathyroid scan in 2002 but she does not recall having this done or why she would have had this.  IMPRESSION NO DEFINITE PARATHYROID ADENOPATHY LOCALIZED IN THE NECK OR CHEST ALTHOUGH THERE IS A FOCUS OF MILDLY PROMINENT ACTIVITY AT THE INFERIOR POLE OF THE LEFT LOBE OF THE THYROID WHICH WOULD BE THE MOST LIKELY SITE ON THIS SCAN.  In general, she has a poor calcium intake. She has a history of vitamin D deficiency and was started on prescription vitamin D last week. She has taken one dose so far. She was also diagnosed with osteoporosis by DEXA. We are awaiting approval for Prolia.  States she has been walking for exercise. States she feels "good".   Denies fever, chills, dizziness, fatigue, unexplained weight loss, loss of appetite, increased thirst, hunger or urination. No muscle weakness.  Denies arthralgias, myalgias.  No nausea, vomiting, diarrhea, constipation or urinary symptoms.   Reviewed allergies, medications, past medical, surgical, and social history.   Review of Systems Pertinent positives and negatives in the history of present illness.     Objective:   Physical Exam  Constitutional: She is oriented to person, place, and time. She appears well-developed and well-nourished. No distress.  Eyes: Conjunctivae and EOM are normal. Pupils are equal, round, and reactive to light.  Neck: Normal range of motion. Neck supple. No thyromegaly present.  Cardiovascular: Normal rate, regular rhythm, normal heart sounds and intact distal pulses.  Exam reveals no gallop and no friction rub.   No murmur heard. Pulmonary/Chest: Effort normal and breath sounds normal.    Musculoskeletal: Normal range of motion.  Lymphadenopathy:    She has no cervical adenopathy.  Neurological: She is alert and oriented to person, place, and time. She has normal strength and normal reflexes. She displays no tremor. No cranial nerve deficit. She exhibits normal muscle tone. Coordination and gait normal.  Skin: Skin is warm and dry. No rash noted. No pallor.  Psychiatric: She has a normal mood and affect. Her behavior is normal. Judgment and thought content normal.   BP 124/80 Comment: after relaxing  Pulse 65   Wt 170 lb 6.4 oz (77.3 kg)   BMI 25.53 kg/m      Assessment & Plan:  Hypercalcemia - Plan: TSH, CANCELED: Parathyroid hormone, intact (no Ca)  History of parathyroid disease - Plan: CANCELED: Parathyroid hormone, intact (no Ca)  Discussed that her calcium is abnormally elevated but she does not appear to be symptomatic. Will get a PTH and TSH today and follow up pending results.  Review of her parathyroid scan result is vague but does indicate that she has been having issues since 2002.

## 2016-11-27 LAB — PARATHYROID HORMONE, INTACT (NO CA): PTH: 142 pg/mL — ABNORMAL HIGH (ref 14–64)

## 2016-11-28 ENCOUNTER — Other Ambulatory Visit: Payer: Self-pay | Admitting: Internal Medicine

## 2016-11-28 DIAGNOSIS — E349 Endocrine disorder, unspecified: Secondary | ICD-10-CM

## 2016-11-28 DIAGNOSIS — R7989 Other specified abnormal findings of blood chemistry: Secondary | ICD-10-CM

## 2016-12-29 ENCOUNTER — Other Ambulatory Visit: Payer: Medicare Other

## 2016-12-29 DIAGNOSIS — E559 Vitamin D deficiency, unspecified: Secondary | ICD-10-CM

## 2016-12-30 LAB — VITAMIN D 25 HYDROXY (VIT D DEFICIENCY, FRACTURES): VIT D 25 HYDROXY: 30 ng/mL (ref 30–100)

## 2017-01-07 LAB — COLOGUARD: Cologuard: NEGATIVE

## 2017-01-12 ENCOUNTER — Ambulatory Visit (INDEPENDENT_AMBULATORY_CARE_PROVIDER_SITE_OTHER): Payer: Medicare Other | Admitting: Internal Medicine

## 2017-01-12 ENCOUNTER — Encounter: Payer: Self-pay | Admitting: Internal Medicine

## 2017-01-12 VITALS — BP 132/82 | HR 82 | Ht 68.0 in | Wt 172.0 lb

## 2017-01-12 DIAGNOSIS — M8580 Other specified disorders of bone density and structure, unspecified site: Secondary | ICD-10-CM | POA: Insufficient documentation

## 2017-01-12 DIAGNOSIS — E213 Hyperparathyroidism, unspecified: Secondary | ICD-10-CM | POA: Diagnosis not present

## 2017-01-12 DIAGNOSIS — M818 Other osteoporosis without current pathological fracture: Secondary | ICD-10-CM | POA: Diagnosis not present

## 2017-01-12 DIAGNOSIS — M810 Age-related osteoporosis without current pathological fracture: Secondary | ICD-10-CM

## 2017-01-12 MED ORDER — FUROSEMIDE 20 MG PO TABS: 20.0000 mg | ORAL_TABLET | Freq: Every day | ORAL | 3 refills | Status: DC

## 2017-01-12 MED ORDER — LISINOPRIL 10 MG PO TABS: 10.0000 mg | ORAL_TABLET | Freq: Every day | ORAL | 3 refills | Status: DC

## 2017-01-12 NOTE — Progress Notes (Addendum)
Patient ID: Tracy Rowe, female   DOB: [birth year], 70 y.o.   MRN: [id number]    HPI  Tracy Rowe is a 71 y.o.-year-old female, referred by her PCP, Harland Dingwall - NP, for evaluation for hypercalcemia/hyperparathyroidism.  Pt was dx with hypercalcemia in 09/2016. I reviewed pt's pertinent labs: Lab Results  Component Value Date   PTH 142 (H) 11/26/2016   CALCIUM 11.0 (H) 11/20/2016   CALCIUM 11.1 (H) 09/25/2016   She had an indeterminate Tc sestamibi scan (2002): NO DEFINITE PARATHYROID ADENOPATHY LOCALIZED IN THE NECK OR CHEST ALTHOUGH THERE IS A FOCUS OF MILDLY PROMINENT ACTIVITY AT THE INFERIOR POLE OF THE LEFT LOBE OF THE THYROID WHICH WOULD BE THE MOST LIKELY SITE ON THIS SCAN.  I reviewed pt's DEXA scans: Date L1-4 T score FN T score  11/19/2016 L2, L3:-2.4 RFN: -2.3 LFN: -2.3  08/09/2007 L1, L3, L4: -2.0 RFN: n/a LFN: -1.8  Per review of chart, she was approved for Prolia.  No fractures or falls.   No h/o kidney stones.  + h/o CKD. Last BUN/Cr: Lab Results  Component Value Date   BUN 22 11/20/2016   BUN 14 09/25/2016   CREATININE 1.37 (H) 11/20/2016   CREATININE 1.09 (H) 09/25/2016   Pt is on HCTZ - started in 09/2016.  + h/o vitamin D deficiency. On vitamin D 1000 units after she finished a Ergocalciferol course - started 10/2016. Reviewed vit D levels: Lab Results  Component Value Date   VD25OH 30 12/29/2016   VD25OH 10 (L) 10/23/2016   Pt is not on calcium; she also eats dairy but not too many green, leafy, vegetables.  Pt does not have a FH of hypercalcemia, pituitary tumors, thyroid cancer, or osteoporosis.   Pt. also has a history of HTN.  ROS: Constitutional: no weight gain/loss, no fatigue, + hot flushes Eyes: no blurry vision, no xerophthalmia ENT: no sore throat, no nodules palpated in throat, no dysphagia/odynophagia, no hoarseness Cardiovascular: no CP/SOB/palpitations/leg swelling Respiratory: no cough/SOB Gastrointestinal: no  N/V/D/+ C Musculoskeletal: no muscle/joint aches Skin: no rashes Neurological: no tremors/numbness/tingling/dizziness Psychiatric: + mild depression/no anxiety  Past Medical History:  Diagnosis Date  . HTN (hypertension)   . Osteoporosis    Past Surgical History:  Procedure Laterality Date  . ABDOMINAL HYSTERECTOMY     Social History   Social History  . Marital status: Married    Spouse name: N/A  . Number of children: 0   Occupational History  . retired   Social History Main Topics  . Smoking status: Never Smoker  . Smokeless tobacco: Never Used  . Alcohol use No  . Drug use: No   Current Outpatient Prescriptions on File Prior to Visit  Medication Sig Dispense Refill  . lisinopril-hydrochlorothiazide (PRINZIDE,ZESTORETIC) 10-12.5 MG tablet Take 1 tablet by mouth daily. 30 tablet 5  . polyethylene glycol powder (GLYCOLAX/MIRALAX) powder Take 17 g by mouth 2 (two) times daily as needed. 3350 g 1  . Vitamin D, Ergocalciferol, (DRISDOL) 50000 units CAPS capsule Take 1 capsule (50,000 Units total) by mouth every 7 (seven) days. 8 capsule 0   No current facility-administered medications on file prior to visit.    No Known Allergies Family History  Problem Relation Age of Onset  . Diabetes Sister   . Hypertension Sister   . Hypertension Brother   . Heart disease Brother    PE: BP 132/82 (BP Location: Left Arm, Patient Position: Sitting)   Pulse 82   Ht 5\' 8"  (1.727  m)   Wt 172 lb (78 kg)   SpO2 98%   BMI 26.15 kg/m  Wt Readings from Last 3 Encounters:  01/12/17 172 lb (78 kg)  11/26/16 170 lb 6.4 oz (77.3 kg)  11/20/16 170 lb 6.4 oz (77.3 kg)   Constitutional: overweight, in NAD. No kyphosis. Eyes: PERRLA, EOMI, no exophthalmos ENT: moist mucous membranes, no thyromegaly, no cervical lymphadenopathy Cardiovascular: RRR, No MRG Respiratory: CTA B Gastrointestinal: abdomen soft, NT, ND, BS+ Musculoskeletal: no deformities, strength intact in all 4 Skin:  moist, warm, no rashes Neurological: no tremor with outstretched hands, DTR normal in all 4  Assessment: 1. Hypercalcemia/hyperparathyroidism  2. OP  Plan: Patient has had several instances of elevated calcium, with the highest level being at 11.1. An intact PTH level was also high, at 142.  - Patient also  has vitamin D deficiency,  with the last level being 10 >> now normalized on supplementation.  - Aside osteoporosis, no apparent complications from hypercalcemia: no h/o nephrolithiasis no fractures. No abdominal pain (other than related to diverticulitis/gastritis), depression, bone pain. She has constipation. - I discussed with the patient about the physiology of calcium and parathyroid hormone, and possible side effects from increased PTH, including kidney stones, osteoporosis, abdominal pain, etc.  - We discussed that we need to check whether her hyperparathyroidism is primary (Familial hypercalcemic hypocalciuria or parathyroid adenoma) or secondary (to conditions like: vitamin D deficiency, calcium malabsorption, hypercalciuria, renal insufficiency, etc.). - I discussed with her that we first need to stop HCTZ (which raises serum calcium) (>> will switch to Lasix), so we can further investigate her for primary or secondary hyperparathyroidism. - we also need to have a normal vitamin D level at the time of the check. We will check the following after she comes off HCTZ: calcium level intact PTH ([facility]) Magnesium Phosphorus vitamin D- 25 HO and 1,25 HO 24h urinary calcium/creatinine ratio - given instructions for urine collection - We discussed possible consequences of hyperparathyroidism: ~1/3 pts will develop complications over 15 years (OP, nephrolithiasis).  - If the tests indicate a parathyroid adenoma, she agrees with a referral to surgery.  - criteria for parathyroid surgery:  Increased calcium by more than 1 mg/dL above the upper limit of normal  Kidney ds.  Osteoporosis  (or Vb fx) Age <74 years old + (2013): High UCa >400 mg/d and increased stone risk by biochemical stone risk analysis Presence of nephrolithiasis or nephrocalcinosis Pt's preference!  - I advised the patient to try to continue the 1000 units of vitamin D supplement fro now - I will see the patient back in 4 months, but she will return for labs sooner.  2. OP - reviewed DEXA scan reports (and images of last DEXA) - we discussed to try to solve the hyperparathyroid pb first and then focus on the OP - she is interested in Prolia and I agree with this, but would not start quite now  Component     Latest Ref Rng & Units 03/02/2017 03/04/2017  Sodium     135 - 146 mmol/L 146   Potassium     3.5 - 5.3 mmol/L 4.2   Chloride     98 - 110 mmol/L 107   CO2     20 - 31 mmol/L 24   Glucose     65 - 99 mg/dL 97   BUN     7 - 25 mg/dL 13   Creatinine     0.60 - 0.93 mg/dL 1.16 (  H)   Calcium     8.6 - 10.4 mg/dL 11.0 (H)   GFR, Est African American     >=60 mL/min 54 (L)   GFR, Est Non African American     >=60 mL/min 47 (L)   Vitamin D 1, 25 (OH) Total     18 - 72 pg/mL 68   Vitamin D3 1, 25 (OH)     pg/mL 44   Vitamin D2 1, 25 (OH)     pg/mL 24   Creatinine, Urine     20 - 320 mg/dL  63  Creatinine, 24H Ur     0.63 - 2.50 g/24 h  0.50 (L)  Calcium, Ur     Not estab mg/dL  3  Calcium, 24 hour urine     35 - 250 mg/24 h  24 (L)  PTH, Intact     15 - 65 pg/mL 55   Phosphorus     2.3 - 4.6 mg/dL 2.4   Magnesium     1.5 - 2.5 mg/dL 1.8   VITD     30.00 - 100.00 ng/mL 31.30    Calcium still high, with a non-suppressed PTH.  Normal vitamin D, phosphorus, magnesium.  Her urinary calcium is actually low, but the creatinine is also low. I will not ask her to repeat the collection, since even a higher creatinine level will most likely not push her over the limit for hypercalciuria.  Due to persistent hypercalcemia with nonsuppressed PTH and her osteoporosis, I would suggest that she  sees Dr. Harlow Asa for a second opinion to see if she needs parathyroid.   [name], MD PhD [facility] Endocrin[facility]

## 2017-01-12 NOTE — Patient Instructions (Signed)
Please stop Lisinopril-HCTZ and start: - Lisinopril 10 mg daily - Furosemide (Lasix) 20 mg daily  Come back for labs after July 9th.  Please come back for a follow-up appointment in 4 months.  Hypercalcemia Hypercalcemia is having too much calcium in the blood. The body needs calcium to make bones and keep them strong. Calcium also helps the muscles, nerves, brain, and heart work the way they should. Most of the calcium in the body is in the bones. There is also some calcium in the blood. Hypercalcemia can happen when calcium comes out of the bones, or when the kidneys are not able to remove calcium from the blood. Hypercalcemia can be mild or severe. What are the causes? There are many possible causes of hypercalcemia. Common causes include:  Hyperparathyroidism. This is a condition in which the body produces too much parathyroid hormone. There are four parathyroid glands in your neck. These glands produce a chemical messenger (hormone) that helps the body absorb calcium from foods and helps your bones release calcium.  Certain kinds of cancer, such as lung cancer, breast cancer, or myeloma. Less common causes of hypercalcemia include:  Getting too much calcium or vitamin D from your diet.  Kidney failure.  Hyperthyroidism.  Being on bed rest for a long time.  Certain medicines.  Infections.  Sarcoidosis. What increases the risk? This condition is more likely to develop in:  Women.  People who are 60 years or older.  People who have a family history of hypercalcemia. What are the signs or symptoms? Mild hypercalcemia that starts slowly may not cause symptoms. Severe, sudden hypercalcemia is more likely to cause symptoms, such as:  Loss of appetite.  Increased thirst and frequent urination.  Fatigue.  Nausea and vomiting.  Headache.  Abdominal pain.  Muscle pain, twitching, or weakness.  Constipation.  Blood in the urine.  Pain in the side of the back  (flank pain).  Anxiety, confusion, or depression.  Irregular heartbeat (arrhythmia).  Loss of consciousness. How is this diagnosed? This condition may be diagnosed based on:  Your symptoms.  Blood tests.  Urine tests.  X-rays.  Ultrasound.  MRI.  CT scan. How is this treated? Treatment for hypercalcemia depends on the cause. Treatment may include:  Receiving fluids through an IV tube.  Medicines that keep calcium levels steady after receiving fluids (loop diuretics).  Medicines that keep calcium in your bones (bisphosphonates).  Medicines that lower the calcium level in your blood.  Surgery to remove overactive parathyroid glands. Follow these instructions at home:  Take over-the-counter and prescription medicines only as told by your health care provider.  Follow instructions from your health care provider about eating or drinking restrictions.  Drink enough fluid to keep your urine clear or pale yellow.  Stay active. Weight-bearing exercise helps to keep calcium in your bones. Follow instructions from your health care provider about what type and level of exercise is safe for you.  Keep all follow-up visits as told by your health care provider. This is important. Contact a health care provider if:  You have a fever.  You have flank or abdominal pain that is getting worse. Get help right away if:  You have severe abdominal or flank pain.  You have chest pain.  You have trouble breathing.  You become very confused and sleepy.  You lose consciousness. This information is not intended to replace advice given to you by your health care provider. Make sure you discuss any questions you have  with your health care provider. Document Released: 10/25/2004 Document Revised: 01/17/2016 Document Reviewed: 12/27/2014 Elsevier Interactive Patient Education  2017 [employer].

## 2017-01-15 ENCOUNTER — Encounter: Payer: Self-pay | Admitting: Family Medicine

## 2017-03-02 ENCOUNTER — Other Ambulatory Visit (INDEPENDENT_AMBULATORY_CARE_PROVIDER_SITE_OTHER): Payer: Medicare Other

## 2017-03-02 DIAGNOSIS — E213 Hyperparathyroidism, unspecified: Secondary | ICD-10-CM

## 2017-03-02 LAB — VITAMIN D 25 HYDROXY (VIT D DEFICIENCY, FRACTURES): VITD: 31.3 ng/mL (ref 30.00–100.00)

## 2017-03-02 LAB — PHOSPHORUS: Phosphorus: 2.4 mg/dL (ref 2.3–4.6)

## 2017-03-02 LAB — MAGNESIUM: MAGNESIUM: 1.8 mg/dL (ref 1.5–2.5)

## 2017-03-03 LAB — BASIC METABOLIC PANEL WITH GFR
BUN: 13 mg/dL (ref 7–25)
CHLORIDE: 107 mmol/L (ref 98–110)
CO2: 24 mmol/L (ref 20–31)
CREATININE: 1.16 mg/dL — AB (ref 0.60–0.93)
Calcium: 11 mg/dL — ABNORMAL HIGH (ref 8.6–10.4)
GFR, EST AFRICAN AMERICAN: 54 mL/min — AB (ref >=60)
GFR, Est Non African American: 47 mL/min — ABNORMAL LOW (ref >=60)
Glucose, Bld: 97 mg/dL (ref 65–99)
POTASSIUM: 4.2 mmol/L (ref 3.5–5.3)
SODIUM: 146 mmol/L (ref 135–146)

## 2017-03-03 LAB — PARATHYROID HORMONE, INTACT (NO CA): PTH: 55 pg/mL (ref 15–65)

## 2017-03-05 LAB — CREATININE, URINE, 24 HOUR
CREATININE 24H UR: 0.5 g/(24.h) — AB (ref 0.63–2.50)
Creatinine, Urine: 63 mg/dL (ref 20–320)

## 2017-03-05 LAB — VITAMIN D 1,25 DIHYDROXY
VITAMIN D3 1, 25 (OH): 44 pg/mL
Vitamin D 1, 25 (OH)2 Total: 68 pg/mL (ref 18–72)
Vitamin D2 1, 25 (OH)2: 24 pg/mL

## 2017-03-05 LAB — CALCIUM, URINE, 24 HOUR
Calcium, 24 hour urine: 24 mg/24 h — ABNORMAL LOW (ref 35–250)
Calcium, Ur: 3 mg/dL

## 2017-03-06 NOTE — Addendum Note (Signed)
Addended by: [name] on: 03/06/2017 06:12 PM   Modules accepted: Orders

## 2017-03-09 ENCOUNTER — Telehealth: Payer: Self-pay

## 2017-03-09 NOTE — Telephone Encounter (Signed)
Called patient and gave lab results. Patient had no questions or concerns.  

## 2017-03-09 NOTE — Telephone Encounter (Signed)
-----   Message from [name], MD sent at 03/06/2017  6:14 PM EDT ----- Tracy Rowe, can you please call pt: Calcium still high, with a high-normal PTH.  Normal vitamin D, phosphorus, magnesium.  Her urinary calcium is not high.  I would suggest that she sees Dr. Harlow Asa for a second opinion to see if she needs parathyroid Sx. I placed the referral and his office will be contacting her.

## 2017-04-16 ENCOUNTER — Other Ambulatory Visit (HOSPITAL_COMMUNITY): Payer: Self-pay | Admitting: Surgery

## 2017-04-16 DIAGNOSIS — E210 Primary hyperparathyroidism: Secondary | ICD-10-CM

## 2017-05-05 ENCOUNTER — Encounter (HOSPITAL_COMMUNITY)
Admission: RE | Admit: 2017-05-05 | Discharge: 2017-05-05 | Disposition: A | Discharge status: Home / Self Care | Payer: Medicare Other | Source: Ambulatory Visit | Attending: Surgery | Admitting: Surgery

## 2017-05-05 DIAGNOSIS — E210 Primary hyperparathyroidism: Secondary | ICD-10-CM | POA: Insufficient documentation

## 2017-05-05 MED ORDER — TECHNETIUM TC 99M SESTAMIBI GENERIC - CARDIOLITE
25.0000 | Freq: Once | INTRAVENOUS | Status: AC | PRN
  Administered 2017-05-05: 25 via INTRAVENOUS

## 2017-05-08 ENCOUNTER — Encounter: Payer: Self-pay | Admitting: Family Medicine

## 2017-05-08 ENCOUNTER — Other Ambulatory Visit: Payer: Self-pay | Admitting: Surgery

## 2017-05-08 DIAGNOSIS — E210 Primary hyperparathyroidism: Secondary | ICD-10-CM

## 2017-05-15 ENCOUNTER — Encounter: Payer: Self-pay | Admitting: Internal Medicine

## 2017-05-15 ENCOUNTER — Ambulatory Visit (INDEPENDENT_AMBULATORY_CARE_PROVIDER_SITE_OTHER): Payer: Medicare Other | Admitting: Internal Medicine

## 2017-05-15 VITALS — BP 132/80 | HR 73 | Wt 172.0 lb

## 2017-05-15 DIAGNOSIS — M818 Other osteoporosis without current pathological fracture: Secondary | ICD-10-CM | POA: Diagnosis not present

## 2017-05-15 DIAGNOSIS — E559 Vitamin D deficiency, unspecified: Secondary | ICD-10-CM | POA: Diagnosis not present

## 2017-05-15 DIAGNOSIS — E213 Hyperparathyroidism, unspecified: Secondary | ICD-10-CM | POA: Diagnosis not present

## 2017-05-15 LAB — VITAMIN D 25 HYDROXY (VIT D DEFICIENCY, FRACTURES): VITD: 26.51 ng/mL — ABNORMAL LOW (ref 30.00–100.00)

## 2017-05-15 NOTE — Progress Notes (Signed)
Patient ID: Tracy Rowe, female   DOB: 1945/10/21, 71 y.o.   MRN: [id number]    HPI  Tracy Rowe is a 71 y.o.-year-old female, returning for follow-up for hypercalcemia/hyperparathyroidism. Last visit 4 months ago.  Pt was dx with hypercalcemia in 09/2016. I reviewed pt's pertinent labs: Lab Results  Component Value Date   PTH 55 03/02/2017   PTH 142 (H) 11/26/2016   CALCIUM 11.0 (H) 03/02/2017   CALCIUM 11.0 (H) 11/20/2016   CALCIUM 11.1 (H) 09/25/2016   She had an indeterminate Tc sestamibi scan (2002): NO DEFINITE PARATHYROID ADENOPATHY LOCALIZED IN THE NECK OR CHEST ALTHOUGH THERE IS A FOCUS OF MILDLY PROMINENT ACTIVITY AT THE INFERIOR POLE OF THE LEFT LOBE OF THE THYROID WHICH WOULD BE THE MOST LIKELY SITE ON THIS SCAN.  We reviewed together the labs obtained at last visit Component     Latest Ref Rng & Units 03/02/2017 03/04/2017  Sodium     135 - 146 mmol/L 146   Potassium     3.5 - 5.3 mmol/L 4.2   Chloride     98 - 110 mmol/L 107   CO2     20 - 31 mmol/L 24   Glucose     65 - 99 mg/dL 97   BUN     7 - 25 mg/dL 13   Creatinine     0.60 - 0.93 mg/dL 1.16 (H)   Calcium     8.6 - 10.4 mg/dL 11.0 (H)   GFR, Est African American     >=60 mL/min 54 (L)   GFR, Est Non African American     >=60 mL/min 47 (L)   Vitamin D 1, 25 (OH) Total     18 - 72 pg/mL 68   Vitamin D3 1, 25 (OH)     pg/mL 44   Vitamin D2 1, 25 (OH)     pg/mL 24   Creatinine, Urine     20 - 320 mg/dL  63  Creatinine, 24H Ur     0.63 - 2.50 g/24 h  0.50 (L)  Calcium, Ur     Not estab mg/dL  3  Calcium, 24 hour urine     35 - 250 mg/24 h  24 (L)  PTH, Intact     15 - 65 pg/mL 55   Phosphorus     2.3 - 4.6 mg/dL 2.4   Magnesium     1.5 - 2.5 mg/dL 1.8   VITD     30.00 - 100.00 ng/mL 31.30    Calcium still high, with a non-suppressed PTH.  Normal vitamin D, phosphorus, magnesium.  Her urinary calcium is actually low, but the creatinine is also low. I did not ask her to repeat the  collection, since even a higher creatinine level will most likely not push her over the limit for hypercalciuria.  Due to persistent hypercalcemia with nonsuppressed PTH and her osteoporosis, I referred her to Dr. Harlow Asa for a second opinion to see if she needs parathyroid sx.  She saw him and since then she had another technetium sestamibi parathyroid scan (05/05/2017): Left inferior parathyroid adenoma and a possible right superior parathyroid adenoma versus thyroid nodule.   He also ordered a thyroid ultrasound which is now pending - 05/20/2017.  I reviewed pt's DEXA scans: Date L1-4 T score FN T score  11/19/2016 L2, L3:-2.4 RFN: -2.3 LFN: -2.3  08/09/2007 L1, L3, L4: -2.0 RFN: n/a LFN: -1.8  Per review of chart, She was  approved for Prolia.  She denies fractures or falls.   She denies kidney stones.  She has a h/o CKD. Last BUN/Cr: Lab Results  Component Value Date   BUN 13 03/02/2017   BUN 22 11/20/2016   BUN 14 09/25/2016   Lab Results  Component Value Date   CREATININE 1.16 (H) 03/02/2017   CREATININE 1.37 (H) 11/20/2016   CREATININE 1.09 (H) 09/25/2016   She was on HCTZ - since 09/2016. At last visit, we changed to Lasix.  She has a history of  h/o vitamin D deficiency. On vitamin D 1000 units after she finished a Ergocalciferol course - started 10/2016.  Vitamin D levels: Lab Results  Component Value Date   VD25OH 31.30 03/02/2017   VD25OH 30 12/29/2016   VD25OH 10 (L) 10/23/2016   Pt does not have a FH of hypercalcemia, pituitary tumors, thyroid cancer, or osteoporosis.   Pt. also has a history of HTN.  ROS: Constitutional: no weight gain/no weight loss, no fatigue, + hot flushes Eyes: no blurry vision, no xerophthalmia ENT: no sore throat, no nodules palpated in throat, no dysphagia, no odynophagia, no hoarseness Cardiovascular: no CP/no SOB/no palpitations/no leg swelling Respiratory: no cough/no SOB/no wheezing Gastrointestinal: no N/no V/no D/no  C/no acid reflux Musculoskeletal: no muscle aches/no joint aches Skin: no rashes, no hair loss Neurological: no tremors/no numbness/no tingling/no dizziness  I reviewed pt's medications, allergies, PMH, social hx, family hx, and changes were documented in the history of present illness. Otherwise, unchanged from my initial visit note.  Past Medical History:  Diagnosis Date  . HTN (hypertension)   . Osteoporosis    Past Surgical History:  Procedure Laterality Date  . ABDOMINAL HYSTERECTOMY     Social History   Social History  . Marital status: Married    Spouse name: N/A  . Number of children: N/A  . Years of education: N/A   Occupational History  . Not on file.   Social History Main Topics  . Smoking status: Never Smoker  . Smokeless tobacco: Never Used  . Alcohol use No  . Drug use: No  . Sexual activity: No   Other Topics Concern  . Not on file   Social History Narrative  . No narrative on file   Current Outpatient Prescriptions on File Prior to Visit  Medication Sig Dispense Refill  . furosemide (LASIX) 20 MG tablet Take 1 tablet (20 mg total) by mouth daily. 90 tablet 3  . lisinopril (PRINIVIL,ZESTRIL) 10 MG tablet Take 1 tablet (10 mg total) by mouth daily. 90 tablet 3  . polyethylene glycol powder (GLYCOLAX/MIRALAX) powder Take 17 g by mouth 2 (two) times daily as needed. 3350 g 1   No current facility-administered medications on file prior to visit.    No Known Allergies Family History  Problem Relation Age of Onset  . Diabetes Sister   . Hypertension Sister   . Hypertension Brother   . Heart disease Brother    PE: BP 132/80 (BP Location: Left Arm, Patient Position: Sitting)   Pulse 73   Wt 172 lb (78 kg)   SpO2 98%   BMI 26.15 kg/m   Wt Readings from Last 3 Encounters:  05/15/17 172 lb (78 kg)  01/12/17 172 lb (78 kg)  11/26/16 170 lb 6.4 oz (77.3 kg)   Constitutional: overweight, in NAD Eyes: PERRLA, EOMI, no exophthalmos ENT: moist  mucous membranes, no thyromegaly, no cervical lymphadenopathy Cardiovascular: RRR, No MRG Respiratory: CTA B Gastrointestinal: abdomen soft, NT,  ND, BS+ Musculoskeletal: no deformities, strength intact in all 4 Skin: moist, warm, no rashes Neurological: no tremor with outstretched hands, DTR normal in all 4  Assessment: 1. Hypercalcemia/hyperparathyroidism  2. OP  3. Vit D def  Plan: Patient has had several instances of hypercalcemia, with the highest level being at 11.1. An intact PTH level was also high, at 142.  - Patient also  has vitamin D deficiency,  with the last level being 10 >> now normalized with supplementation - Aside OP, no apparent complications from hypercalcemia: no h/o nephrolithiasis no fractures. No abdominal pain, depression, bone pain. She does have occas. constipation. - Investigation at last visit pointed towards Primary HPT >> referred to Dr. Harlow Asa (surgery). He ordered a Tc sestamibi scan which was positive for a L inf adenoma and ambiguous about a possible R sup adenoma vs a thyroid nodule >> she has a thyroid U/S in few days. We did discuss that the Q is not if she has surgery but how extensive this would be. - As she had labs 2.5 mo ago >> no need to repeat them today, except for a vit D level as this was at the [facility] and I would like to make sure her vit D is not too low at the time of her Sx - I advised the patient to continue the 1000 units vitamin D  - I will see the patient back in 3 months  2. OP - reviewed latest DEXA scan reports  - we again discussed to try to solve the hyperparathyroid pb first and then focus on the OP - will need a new DEXA scan in 2020 - she is interested in Prolia >> we may need to use this  3. Vit D def - normal vit D 2.5 mo ago, on 1000 units vit D >> will continue - check vit D now Component     Latest Ref Rng & Units 05/15/2017  VITD     30.00 - 100.00 ng/mL 26.51 (L)   Vitamin D slightly low. We will increase her  Vitamin D dose to 2000 units daily.  [name], MD PhD [facility] Endocrinology

## 2017-05-15 NOTE — Patient Instructions (Signed)
Please continue vitamin D 1000 units daily.  Please stop at the lab.  Please come back for a follow-up appointment in 3 months.

## 2017-05-18 ENCOUNTER — Telehealth: Payer: Self-pay

## 2017-05-18 NOTE — Telephone Encounter (Signed)
Called patient and gave lab results. Patient had no questions or concerns.  

## 2017-05-18 NOTE — Telephone Encounter (Signed)
-----   Message from [name], MD sent at 05/15/2017  5:28 PM EDT ----- Almyra Free, can you please call pt: Vitamin D slightly low. Let's increase her Vitamin D dose to 2000 units daily.

## 2017-05-20 ENCOUNTER — Ambulatory Visit
Admission: RE | Admit: 2017-05-20 | Discharge: 2017-05-20 | Disposition: A | Discharge status: Home / Self Care | Payer: Medicare Other | Source: Ambulatory Visit | Attending: Surgery | Admitting: Surgery

## 2017-05-20 DIAGNOSIS — E210 Primary hyperparathyroidism: Secondary | ICD-10-CM

## 2017-05-22 ENCOUNTER — Encounter: Payer: Self-pay | Admitting: Family Medicine

## 2017-05-22 ENCOUNTER — Ambulatory Visit (INDEPENDENT_AMBULATORY_CARE_PROVIDER_SITE_OTHER): Payer: Medicare Other | Admitting: Family Medicine

## 2017-05-22 VITALS — BP 124/72 | HR 75 | Wt 174.2 lb

## 2017-05-22 DIAGNOSIS — E785 Hyperlipidemia, unspecified: Secondary | ICD-10-CM | POA: Insufficient documentation

## 2017-05-22 DIAGNOSIS — I10 Essential (primary) hypertension: Secondary | ICD-10-CM

## 2017-05-22 DIAGNOSIS — Z23 Encounter for immunization: Secondary | ICD-10-CM | POA: Diagnosis not present

## 2017-05-22 DIAGNOSIS — E782 Mixed hyperlipidemia: Secondary | ICD-10-CM | POA: Diagnosis not present

## 2017-05-22 LAB — BASIC METABOLIC PANEL
BUN/Creatinine Ratio: 11 (calc) (ref 6–22)
BUN: 12 mg/dL (ref 7–25)
CALCIUM: 10.9 mg/dL — AB (ref 8.6–10.4)
CHLORIDE: 107 mmol/L (ref 98–110)
CO2: 29 mmol/L (ref 20–32)
Creat: 1.05 mg/dL — ABNORMAL HIGH (ref 0.60–0.93)
Glucose, Bld: 97 mg/dL (ref 65–99)
Potassium: 4.5 mmol/L (ref 3.5–5.3)
Sodium: 142 mmol/L (ref 135–146)

## 2017-05-22 LAB — LIPID PANEL
CHOL/HDL RATIO: 4.8 (calc) (ref <5.0)
Cholesterol: 212 mg/dL — ABNORMAL HIGH (ref <200)
HDL: 44 mg/dL — ABNORMAL LOW (ref >50)
LDL CHOLESTEROL (CALC): 143 mg/dL — AB
NON-HDL CHOLESTEROL (CALC): 168 mg/dL — AB (ref <130)
TRIGLYCERIDES: 125 mg/dL (ref <150)

## 2017-05-22 NOTE — Progress Notes (Signed)
   Subjective:    Patient ID: Tracy Rowe, female    DOB: 1945-09-27, 71 y.o.   MRN: [id number]  HPI Chief Complaint  Patient presents with  . 6 month med check    6 month med check. flu shot will be given today   She is here for a 6 month med check.   HTN- Does not check her BP at home. No side effects of BP medication. She is taking Lisinopril and Lasix daily. Needs potassium level checked.   She is under the care of Dr.Gerghe and Dr. Harlow Asa for hyperparathyroidism and hypercalcemia. States she just had an [place] of her thyroid and awaiting results.  They are also waiting on Prolia treatment for her osteoporosis.   No concerns today.  Denies fever, chills, dizziness, chest pain, palpitations, shortness of breath, abdominal pain, N/V/D, urinary symptoms, LE edema.   Reviewed allergies, medications, past medical, surgical, and social history.   Review of Systems Pertinent positives and negatives in the history of present illness.     Objective:   Physical Exam BP 124/72   Pulse 75   Wt 174 lb 3.2 oz (79 kg)   BMI 26.49 kg/m  Alert and in no distress. Cardiac exam shows a regular sinus rhythm without murmurs or gallops. Lungs are clear to auscultation. Extremities without edema.       Assessment & Plan:  Essential hypertension - Plan: Basic metabolic panel  Mixed hyperlipidemia - Plan: Lipid panel  Needs flu shot - Plan: Flu vaccine HIGH DOSE PF (Fluzone High Dose)  Continue on medication. Start getting more exercise and eat healthy.  Follow up pending labs.  Return for AWV in 6 months.

## 2017-05-22 NOTE — Patient Instructions (Addendum)
Start getting more exercise. At least 150 minutes per week.   Your blood pressure is 124/72 and is in normal range. Continue on your current medication.   Call your insurance company and ask about your co-pay for the shingles vaccine (Shingrix). I recommend getting this if it is affordable.   I will see you after March 1st 2019 for your Medicare annual wellness visit.

## 2017-05-24 ENCOUNTER — Other Ambulatory Visit: Payer: Self-pay | Admitting: Family Medicine

## 2017-05-24 DIAGNOSIS — E782 Mixed hyperlipidemia: Secondary | ICD-10-CM

## 2017-05-24 MED ORDER — ATORVASTATIN CALCIUM 20 MG PO TABS: 20.0000 mg | ORAL_TABLET | Freq: Every day | ORAL | 2 refills | Status: DC

## 2017-05-24 NOTE — Progress Notes (Signed)
   Subjective:    Patient ID: Tracy Rowe, female    DOB: February 29, 1946, 71 y.o.   MRN: [id number]  HPI    Review of Systems     Objective:   Physical Exam        Assessment & Plan:

## 2017-05-27 ENCOUNTER — Encounter: Payer: Medicare Other | Admitting: Family Medicine

## 2017-06-10 ENCOUNTER — Other Ambulatory Visit: Payer: Self-pay | Admitting: Surgery

## 2017-06-10 DIAGNOSIS — E210 Primary hyperparathyroidism: Secondary | ICD-10-CM

## 2017-07-08 ENCOUNTER — Other Ambulatory Visit (HOSPITAL_COMMUNITY)
Admission: RE | Admit: 2017-07-08 | Discharge: 2017-07-08 | Disposition: A | Discharge status: Home / Self Care | Payer: Medicare Other | Source: Ambulatory Visit | Attending: Physician Assistant | Admitting: Physician Assistant

## 2017-07-08 ENCOUNTER — Ambulatory Visit
Admission: RE | Admit: 2017-07-08 | Discharge: 2017-07-08 | Disposition: A | Discharge status: Home / Self Care | Payer: Medicare Other | Source: Ambulatory Visit | Attending: Surgery | Admitting: Surgery

## 2017-07-08 DIAGNOSIS — E210 Primary hyperparathyroidism: Secondary | ICD-10-CM

## 2017-07-08 DIAGNOSIS — E041 Nontoxic single thyroid nodule: Secondary | ICD-10-CM | POA: Diagnosis present

## 2017-07-08 NOTE — Procedures (Signed)
PROCEDURE SUMMARY:  Using direct ultrasound guidance, 5 passes were made using 25 g needles into the nodule within the right lobe of the thyroid.   Ultrasound was used to confirm needle placements on all occasions.   Specimens were sent to Pathology for analysis.  WENDY S BLAIR PA-C 07/08/2017 3:20 PM

## 2017-07-27 ENCOUNTER — Other Ambulatory Visit: Payer: Medicare Other

## 2017-07-27 DIAGNOSIS — E782 Mixed hyperlipidemia: Secondary | ICD-10-CM

## 2017-07-27 LAB — LIPID PANEL
CHOLESTEROL: 135 mg/dL (ref <200)
HDL: 49 mg/dL — ABNORMAL LOW (ref >50)
LDL CHOLESTEROL (CALC): 70 mg/dL
Non-HDL Cholesterol (Calc): 86 mg/dL (calc) (ref <130)
TRIGLYCERIDES: 79 mg/dL (ref <150)
Total CHOL/HDL Ratio: 2.8 (calc) (ref <5.0)

## 2017-07-29 ENCOUNTER — Ambulatory Visit: Payer: Self-pay | Admitting: Surgery

## 2017-08-14 ENCOUNTER — Encounter: Payer: Self-pay | Admitting: Internal Medicine

## 2017-08-14 ENCOUNTER — Ambulatory Visit: Payer: Medicare Other | Admitting: Internal Medicine

## 2017-08-14 VITALS — BP 150/80 | HR 67 | Ht 68.25 in | Wt 182.4 lb

## 2017-08-14 DIAGNOSIS — M818 Other osteoporosis without current pathological fracture: Secondary | ICD-10-CM | POA: Diagnosis not present

## 2017-08-14 DIAGNOSIS — E559 Vitamin D deficiency, unspecified: Secondary | ICD-10-CM | POA: Diagnosis not present

## 2017-08-14 DIAGNOSIS — E213 Hyperparathyroidism, unspecified: Secondary | ICD-10-CM

## 2017-08-14 DIAGNOSIS — E041 Nontoxic single thyroid nodule: Secondary | ICD-10-CM

## 2017-08-14 LAB — VITAMIN D 25 HYDROXY (VIT D DEFICIENCY, FRACTURES): VITD: 38.66 ng/mL (ref 30.00–100.00)

## 2017-08-14 NOTE — Progress Notes (Signed)
Patient ID: Tracy Rowe, female   DOB: 10/07/1945, 71 y.o.   MRN: [id number]    HPI  Tracy Rowe is a 71 y.o.-year-old female, returning for follow-up for hypercalcemia/primary hyperparathyroidism. Last visit 3 months ago.  Pt was dx with hypercalcemia in 09/2016.  I reviewed pertinent labs: Lab Results  Component Value Date   PTH 55 03/02/2017   PTH 142 (H) 11/26/2016   CALCIUM 10.9 (H) 05/22/2017   CALCIUM 11.0 (H) 03/02/2017   CALCIUM 11.0 (H) 11/20/2016   CALCIUM 11.1 (H) 09/25/2016   She had an indeterminate Tc sestamibi scan (2002): NO DEFINITE PARATHYROID ADENOPATHY LOCALIZED IN THE NECK OR CHEST ALTHOUGH THERE IS A FOCUS OF MILDLY PROMINENT ACTIVITY AT THE INFERIOR POLE OF THE LEFT LOBE OF THE THYROID WHICH WOULD BE THE MOST LIKELY SITE ON THIS SCAN.  Reviewed other previous labs: Component     Latest Ref Rng & Units 03/02/2017 03/04/2017  Creatinine     0.60 - 0.93 mg/dL 1.16 (H)   Calcium     8.6 - 10.4 mg/dL 11.0 (H)   GFR, Est African American     >=60 mL/min 54 (L)   GFR, Est Non African American     >=60 mL/min 47 (L)   Vitamin D 1, 25 (OH) Total     18 - 72 pg/mL 68   Vitamin D3 1, 25 (OH)     pg/mL 44   Vitamin D2 1, 25 (OH)     pg/mL 24   Creatinine, Urine     20 - 320 mg/dL  63  Creatinine, 24H Ur     0.63 - 2.50 g/24 h  0.50 (L)  Calcium, Ur     Not estab mg/dL  3  Calcium, 24 hour urine     35 - 250 mg/24 h  24 (L)  PTH, Intact     15 - 65 pg/mL 55   Phosphorus     2.3 - 4.6 mg/dL 2.4   Magnesium     1.5 - 2.5 mg/dL 1.8   VITD     30.00 - 100.00 ng/mL 31.30    Calcium still high, with a non-suppressed PTH.  Normal vitamin D, phosphorus, magnesium.  Her urinary calcium is actually low, but the creatinine is also low. I did not ask her to repeat the collection, since even a higher creatinine level will most likely not push her over the limit for hypercalciuria.  Due to persistent hypercalcemia with nonsuppressed PTH at her osteoporosis, I  referred her to [place] for a second opinion to see if she needs  parathyroid surgery.  Technetium sestamibi parathyroid scan (05/05/2017): Left inferior parathyroid adenoma and a possible right superior parathyroid adenoma versus thyroid nodule.   Thyroid ultrasound (05/20/2017): Several nodules, of which one right superior measuring 1.6 x 1.3 x 1.3, solid, hypoechoic; another left inferior 1.9 x 1.3 x 1.3 cm, mixed, hypoechoic, presumed to be an enlarged parathyroid gland  (07/08/2017) FNA of the 1.6 cm nodule:  scant epithelium (Bethesda category 1)  She had increased discomfort at the time of the bx. Not a lot of pain.   She has parathyroidectomy scheduled with Dr. Harlow Asa in 09/03/2017.  Reviewed patient's DEXA scans: Date L1-4 T score FN T score  11/19/2016 L2, L3:-2.4 RFN: -2.3 LFN: -2.3  08/09/2007 L1, L3, L4: -2.0 RFN: n/a LFN: -1.8  Per review of chart, She was approved for Prolia.  No fractures or falls since last visit.  No history of  kidney stones.  + mild CKD. Last BUN/Cr: Lab Results  Component Value Date   BUN 12 05/22/2017   BUN 13 03/02/2017   BUN 22 11/20/2016   BUN 14 09/25/2016   Lab Results  Component Value Date   CREATININE 1.05 (H) 05/22/2017   CREATININE 1.16 (H) 03/02/2017   CREATININE 1.37 (H) 11/20/2016   CREATININE 1.09 (H) 09/25/2016   She was on HCTZ - since 09/2016. We changed to Lasix.  She has a h/o vitamin D deficiency. She was on vitamin D 1000 units after she finished a Ergocalciferol course - started 10/2016. Reviewed Vitamin D levels - after last level, we increased the dose to 2000 IU: Lab Results  Component Value Date   VD25OH 26.51 (L) 05/15/2017   VD25OH 31.30 03/02/2017   VD25OH 30 12/29/2016   VD25OH 10 (L) 10/23/2016   Pt does not have a FH of hypercalcemia, pituitary tumors, thyroid cancer, or osteoporosis.   Pt. also has a history of HTN.  ROS: Constitutional: no weight gain/no weight loss, no fatigue, + hot  flushes + subjective hyperthermia, no subjective hypothermia Eyes: no blurry vision, no xerophthalmia ENT: no sore throat, + see HPi Cardiovascular: no CP/no SOB/no palpitations/no leg swelling Respiratory: no cough/no SOB/no wheezing Gastrointestinal: no N/no V/no D/no C/no acid reflux Musculoskeletal: no muscle aches/no joint aches Skin: no rashes, no hair loss Neurological: no tremors/no numbness/no tingling/no dizziness  I reviewed pt's medications, allergies, PMH, social hx, family hx, and changes were documented in the history of present illness. Otherwise, unchanged from my initial visit note.  Past Medical History:  Diagnosis Date  . HTN (hypertension)   . Hyperlipidemia   . Osteoporosis    Past Surgical History:  Procedure Laterality Date  . ABDOMINAL HYSTERECTOMY     Social History   Socioeconomic History  . Marital status: Married    Spouse name: Not on file  . Number of children: Not on file  . Years of education: Not on file  . Highest education level: Not on file  Social Needs  . Financial resource strain: Not on file  . Food insecurity - worry: Not on file  . Food insecurity - inability: Not on file  . Transportation needs - medical: Not on file  . Transportation needs - non-medical: Not on file  Occupational History  . Not on file  Tobacco Use  . Smoking status: Never Smoker  . Smokeless tobacco: Never Used  Substance and Sexual Activity  . Alcohol use: No  . Drug use: No  . Sexual activity: No  Other Topics Concern  . Not on file  Social History Narrative  . Not on file   Current Outpatient Medications on File Prior to Visit  Medication Sig Dispense Refill  . atorvastatin (LIPITOR) 20 MG tablet Take 1 tablet (20 mg total) by mouth daily. 30 tablet 2  . furosemide (LASIX) 20 MG tablet Take 1 tablet (20 mg total) by mouth daily. 90 tablet 3  . lisinopril (PRINIVIL,ZESTRIL) 10 MG tablet Take 1 tablet (10 mg total) by mouth daily. 90 tablet 3  .  polyethylene glycol powder (GLYCOLAX/MIRALAX) powder Take 17 g by mouth 2 (two) times daily as needed. 3350 g 1  . Vitamin D, Cholecalciferol, 1000 units CAPS Take 2,000 Units by mouth.      No current facility-administered medications on file prior to visit.    No Known Allergies Family History  Problem Relation Age of Onset  . Diabetes Sister   .  Hypertension Sister   . Hypertension Brother   . Heart disease Brother    PE: BP (!) 150/80   Pulse 67   Ht 5' 8.25" (1.734 m)   Wt 182 lb 6.4 oz (82.7 kg)   SpO2 97%   BMI 27.53 kg/m  Wt Readings from Last 3 Encounters:  08/14/17 182 lb 6.4 oz (82.7 kg)  05/22/17 174 lb 3.2 oz (79 kg)  05/15/17 172 lb (78 kg)   Constitutional: overweight, in NAD Eyes: PERRLA, EOMI, no exophthalmos ENT: moist mucous membranes, no thyromegaly, no cervical lymphadenopathy Cardiovascular: RRR, No MRG Respiratory: CTA B Gastrointestinal: abdomen soft, NT, ND, BS+ Musculoskeletal: no deformities, strength intact in all 4 Skin: moist, warm, no rashes Neurological: no tremor with outstretched hands, DTR normal in all 4  Assessment: 1. Hypercalcemia/Primary hyperparathyroidism  2. OP  3. Vit D def  Plan: Patient with several instances of hypercalcemia. with the highest calcium level at 11.1. She also had a high PTH, at 142, which has decreased since.  She had a history of vitamin D deficiency with a very low vitamin D level at 10, however, this improved since the last check.  After correction of her vitamin D deficiency, further investigation pointed towards primary hyperparathyroidism.  Of note, she also has mild CKD, but not significant enough to cause hyperparathyroidism - Before last visit, I referred her to Dr. Harlow Asa (surgery) for possible parathyroidectomy.  She had a thyroid uptake and scan (reviewed the report along with the patient today) this showed a possible left inferior and a right superior parathyroid adenoma.  A thyroid ultrasound  showed a left inferior nodule, most consistent with a parathyroid adenoma.  She has parathyroid surgery scheduled in 20 days. - Aside osteoporosis,  she has no apparent complications from hypercalcemia : No history of kidney stones or fractures, no abdominal pain, depression, bone pain.  She does have occasional constipation.  - At today's visit, we will check a new PTH and calcium level - I will see the patient back in 6 months  2. OP  - reviewed latest DEXA scan reports - We again discussed to try to the parathyroid problem first and then readdress her osteoporosis - will need a new DEXA scan in 2020 - In the meantime, we will need to optimize her vitamin D, calcium, and I advised her to start weightbearing exercises  3. Vit D def - on 2000 units vit D daily, increased 04/2017  - will recheck level now  4. Thyroid nodule - new thyroid nodule per neck U/S - 1.6 cm  - Bx'ed per Dr. Harlow Asa >> inconclusive - scant epithelial cells  - plan to re-image the thyroid in 6-12 months, however, she refuses to have another biopsy because of the discomfort with the previous biopsy - no neck compression sxs  Orders Placed This Encounter  Procedures  . PTH, intact and calcium    Please send to [facility], do not send to [facility]!  Marland Kitchen VITAMIN D 25 Hydroxy (Vit-D Deficiency, Fractures)   Component     Latest Ref Rng & Units 08/14/2017  Calcium     8.7 - 10.3 mg/dL 10.8 (H)  PTH, Intact     15 - 65 pg/mL 95 (H)  VITD     30.00 - 100.00 ng/mL 38.66   Labs c/w primary HPTH. Normal vit D.  [name], MD PhD [facility] End[facility]

## 2017-08-14 NOTE — Patient Instructions (Addendum)
Please stop at the lab.  Continue vitamin D 2000 units.  Please come back for a follow-up appointment in 6 months.  

## 2017-08-15 LAB — PTH, INTACT AND CALCIUM
CALCIUM: 10.8 mg/dL — AB (ref 8.7–10.3)
PTH: 95 pg/mL — ABNORMAL HIGH (ref 15–65)

## 2017-08-21 NOTE — Patient Instructions (Addendum)
MARLANA MCKOWEN  08/21/2017   Your procedure is scheduled on: 09-03-17  Report to [facility] Main  Entrance              Report to admitting at     0900 AM             Call this number if you have problems the morning of surgery [phone]    Remember: ONLY 1 PERSON MAY GO WITH YOU TO SHORT STAY TO GET  READY MORNING OF YOUR SURGERY.  Do not eat food or drink liquids :After Midnight.     Take these medicines the morning of surgery with A SIP OF WATER: atorvastatin                                You may not have any metal on your body including hair pins and              piercings  Do not wear jewelry, make-up, lotions, powders or perfumes, deodorant             Do not wear nail polish.  Do not shave  48 hours prior to surgery.        Do not bring valuables to the hospital. [place].  Contacts, dentures or bridgework may not be worn into surgery.  Leave suitcase in the car. After surgery it may be brought to your room.                  Please read over the following fact sheets you were given: _____________________________________________________________________           [facility] - Preparing for Surgery Before surgery, you can play an important role.  Because skin is not sterile, your skin needs to be as free of germs as possible.  You can reduce the number of germs on your skin by washing with CHG (chlorahexidine gluconate) soap before surgery.  CHG is an antiseptic cleaner which kills germs and bonds with the skin to continue killing germs even after washing. Please DO NOT use if you have an allergy to CHG or antibacterial soaps.  If your skin becomes reddened/irritated stop using the CHG and inform your nurse when you arrive at Short Stay. Do not shave (including legs and underarms) for at least 48 hours prior to the first CHG shower.  You may shave your face/neck. Please follow these instructions  carefully:  1.  Shower with CHG Soap the night before surgery and the  morning of Surgery.  2.  If you choose to wash your hair, wash your hair first as usual with your  normal  shampoo.  3.  After you shampoo, rinse your hair and body thoroughly to remove the  shampoo.                           4.  Use CHG as you would any other liquid soap.  You can apply chg directly  to the skin and wash                       Gently with a scrungie or clean washcloth.  5.  Apply the CHG Soap to your body ONLY FROM THE NECK DOWN.   Do not use on face/ open                           Wound or open sores. Avoid contact with eyes, ears mouth and genitals (private parts).                       Wash face,  Genitals (private parts) with your normal soap.             6.  Wash thoroughly, paying special attention to the area where your surgery  will be performed.  7.  Thoroughly rinse your body with warm water from the neck down.  8.  DO NOT shower/wash with your normal soap after using and rinsing off  the CHG Soap.                9.  Pat yourself dry with a clean towel.            10.  Wear clean pajamas.            11.  Place clean sheets on your bed the night of your first shower and do not  sleep with pets. Day of Surgery : Do not apply any lotions/deodorants the morning of surgery.  Please wear clean clothes to the hospital/surgery center.  FAILURE TO FOLLOW THESE INSTRUCTIONS MAY RESULT IN THE CANCELLATION OF YOUR SURGERY PATIENT SIGNATURE_________________________________  NURSE SIGNATURE__________________________________  ________________________________________________________________________

## 2017-08-24 ENCOUNTER — Encounter (HOSPITAL_COMMUNITY)
Admission: RE | Admit: 2017-08-24 | Discharge: 2017-08-24 | Disposition: A | Discharge status: Home / Self Care | Payer: Medicare Other | Source: Ambulatory Visit | Attending: Surgery | Admitting: Surgery

## 2017-08-24 ENCOUNTER — Ambulatory Visit (HOSPITAL_COMMUNITY)
Admission: RE | Admit: 2017-08-24 | Discharge: 2017-08-24 | Disposition: A | Discharge status: Home / Self Care | Payer: Medicare Other | Source: Ambulatory Visit | Attending: Anesthesiology | Admitting: Anesthesiology

## 2017-08-24 ENCOUNTER — Other Ambulatory Visit: Payer: Self-pay

## 2017-08-24 ENCOUNTER — Encounter (HOSPITAL_COMMUNITY): Payer: Self-pay

## 2017-08-24 DIAGNOSIS — Z01812 Encounter for preprocedural laboratory examination: Secondary | ICD-10-CM | POA: Insufficient documentation

## 2017-08-24 DIAGNOSIS — I700 Atherosclerosis of aorta: Secondary | ICD-10-CM | POA: Insufficient documentation

## 2017-08-24 DIAGNOSIS — Z01818 Encounter for other preprocedural examination: Secondary | ICD-10-CM

## 2017-08-24 LAB — BASIC METABOLIC PANEL
Anion gap: 4 — ABNORMAL LOW (ref 5–15)
BUN: 13 mg/dL (ref 6–20)
CHLORIDE: 109 mmol/L (ref 101–111)
CO2: 29 mmol/L (ref 22–32)
CREATININE: 1.03 mg/dL — AB (ref 0.44–1.00)
Calcium: 10.8 mg/dL — ABNORMAL HIGH (ref 8.9–10.3)
GFR calc Af Amer: >60 mL/min (ref >60)
GFR calc non Af Amer: 53 mL/min — ABNORMAL LOW (ref >60)
Glucose, Bld: 91 mg/dL (ref 65–99)
POTASSIUM: 3.6 mmol/L (ref 3.5–5.1)
SODIUM: 142 mmol/L (ref 135–145)

## 2017-08-24 LAB — CBC
HEMATOCRIT: 39.1 % (ref 36.0–46.0)
Hemoglobin: 12.6 g/dL (ref 12.0–15.0)
MCH: 26.7 pg (ref 26.0–34.0)
MCHC: 32.2 g/dL (ref 30.0–36.0)
MCV: 82.8 fL (ref 78.0–100.0)
PLATELETS: 175 10*3/uL (ref 150–400)
RBC: 4.72 MIL/uL (ref 3.87–5.11)
RDW: 13.4 % (ref 11.5–15.5)
WBC: 5 10*3/uL (ref 4.0–10.5)

## 2017-08-24 NOTE — Progress Notes (Signed)
ekg 09-25-16 epic

## 2017-09-02 ENCOUNTER — Encounter (HOSPITAL_COMMUNITY): Payer: Self-pay | Admitting: Surgery

## 2017-09-02 NOTE — H&P (Signed)
General [facility]  [name] DOB: March 02, 1946 Married / Language: English / Race: Black or African American Female   History of Present Illness  The patient is a 72 year old female who presents with primary hyperparathyroidism.  CC: follow up primary hyperparathyroidism  Patient returns for follow-up of primary hyperparathyroidism. At my request the patient underwent a nuclear medicine parathyroid scan which identified a left inferior parathyroid adenoma. There was also a significant signal in the right superior position. Ultrasound examination was obtained which demonstrated the parathyroid adenoma in the left inferior position and a moderately suspicious right superior thyroid nodule. Patient underwent fine-needle aspiration but unfortunately insufficient material for diagnosis was obtained. Patient also had a mass in the right superior position which may represent a second parathyroid adenoma. Patient therefore returns today to discuss these results and to plan for surgical intervention.   Problem List/Past Medical OSTEOPOROSIS (M81.0)  PRIMARY HYPERPARATHYROIDISM (E21.0)   Past Surgical History Hysterectomy (not due to cancer) - Partial   Diagnostic Studies History Colonoscopy  within last year Mammogram  within last year Pap Smear  1-5 years ago  Allergies Alean Rinne, RMA; 07/29/2017 11:43 AM) No Known Drug Allergies 04/15/2017 Allergies Reconciled   Medication History Furosemide (20MG  Tablet, Oral) Active. Lisinopril (10MG  Tablet, Oral) Active. Vitamin D (50000UNIT Capsule, Oral) Active. Medications Reconciled  Social History Alcohol use  Occasional alcohol use. Caffeine use  Carbonated beverages. Tobacco use  Never smoker.  Family History Arthritis  Brother, Mother. Diabetes Mellitus  Sister. Hypertension  Brother, Sister.  Pregnancy / Birth History Age at menarche  32 years. Age of menopause   25-50 Contraceptive History  Oral contraceptives. Gravida  1 Maternal age  19-20 Para  0  Other Problems Back Pain  High blood pressure  Thyroid Disease   Vitals Weight: 181 lb Height: 69in Body Surface Area: 1.98 m Body Mass Index: 26.73 kg/m  Temp.: 98.73F  Pulse: 98 (Regular)  BP: 155/80 (Sitting, Left Arm, Standard)   Physical Exam   See vital signs recorded above  GENERAL APPEARANCE Development: normal Nutritional status: normal Gross deformities: none  SKIN Rash, lesions, ulcers: none Induration, erythema: none Nodules: none palpable  EYES Conjunctiva and lids: normal Pupils: equal and reactive Iris: normal bilaterally  EARS, NOSE, MOUTH, THROAT External ears: no lesion or deformity External nose: no lesion or deformity Hearing: grossly normal Lips: no lesion or deformity Dentition: normal for age Oral mucosa: moist  NECK Symmetric: yes Trachea: midline Thyroid: no palpable nodules in the thyroid bed  CHEST Respiratory effort: normal Retraction or accessory muscle use: no Breath sounds: normal bilaterally Rales, rhonchi, wheeze: none  CARDIOVASCULAR Auscultation: regular rhythm, normal rate Murmurs: none Pulses: carotid and radial pulse 2+ palpable Lower extremity edema: none Lower extremity varicosities: none  MUSCULOSKELETAL Station and gait: normal Digits and nails: no clubbing or cyanosis Muscle strength: grossly normal all extremities Range of motion: grossly normal all extremities Deformity: none  LYMPHATIC Cervical: none palpable Supraclavicular: none palpable  PSYCHIATRIC Oriented to person, place, and time: yes Mood and affect: normal for situation Judgment and insight: appropriate for situation    Assessment & Plan  PRIMARY HYPERPARATHYROIDISM (E21.0) RIGHT THYROID NODULE (E04.1)  Patient returns today to discuss the results of her nuclear medicine parathyroid scan and thyroid ultrasound. We  also discussed the results of her fine-needle aspiration biopsy of the right upper pole thyroid nodule.  It appears the patient may have 2 abnormal parathyroid glands, the left inferior gland  in the right superior gland. Patient also has a moderately suspicious nodule in the superior pole of the right lobe of the thyroid. I have recommended neck exploration. We will plan to perform parathyroidectomy, possible biopsy of the thyroid, and possible right thyroid lobectomy if necessary. We discussed the operation and the location of the surgical incision. We discussed the hospital stay to be anticipated. Patient understands and wishes to proceed with surgery in the near future.  The risks and benefits of the procedure have been discussed at length with the patient. The patient understands the proposed procedure, potential alternative treatments, and the course of recovery to be expected. All of the patient's questions have been answered at this time. The patient wishes to proceed with surgery.  Armandina Gemma, [place] [facility] Office: [phone]

## 2017-09-03 ENCOUNTER — Encounter (HOSPITAL_COMMUNITY)
Admission: RE | Disposition: A | Discharge status: Home / Self Care | Payer: Self-pay | Source: Ambulatory Visit | Attending: Surgery

## 2017-09-03 ENCOUNTER — Ambulatory Visit (HOSPITAL_COMMUNITY): Payer: Medicare Other | Admitting: Anesthesiology

## 2017-09-03 ENCOUNTER — Observation Stay (HOSPITAL_COMMUNITY)
Admission: RE | Admit: 2017-09-03 | Discharge: 2017-09-04 | Disposition: A | Discharge status: Home / Self Care | Payer: Medicare Other | Source: Ambulatory Visit | Attending: Surgery | Admitting: Surgery

## 2017-09-03 ENCOUNTER — Other Ambulatory Visit: Payer: Self-pay

## 2017-09-03 ENCOUNTER — Encounter (HOSPITAL_COMMUNITY): Payer: Self-pay

## 2017-09-03 DIAGNOSIS — D351 Benign neoplasm of parathyroid gland: Secondary | ICD-10-CM | POA: Insufficient documentation

## 2017-09-03 DIAGNOSIS — E210 Primary hyperparathyroidism: Principal | ICD-10-CM | POA: Diagnosis present

## 2017-09-03 DIAGNOSIS — D134 Benign neoplasm of liver: Secondary | ICD-10-CM | POA: Diagnosis not present

## 2017-09-03 DIAGNOSIS — E041 Nontoxic single thyroid nodule: Secondary | ICD-10-CM | POA: Diagnosis present

## 2017-09-03 DIAGNOSIS — E213 Hyperparathyroidism, unspecified: Secondary | ICD-10-CM

## 2017-09-03 DIAGNOSIS — Z79899 Other long term (current) drug therapy: Secondary | ICD-10-CM | POA: Diagnosis not present

## 2017-09-03 HISTORY — PX: THYROID LOBECTOMY: SHX420

## 2017-09-03 HISTORY — PX: PARATHYROIDECTOMY: SHX19

## 2017-09-03 SURGERY — PARATHYROIDECTOMY
Anesthesia: General | Site: Neck | Laterality: Right

## 2017-09-03 MED ORDER — ONDANSETRON 4 MG PO TBDP: 4.0000 mg | ORAL_TABLET | Freq: Four times a day (QID) | ORAL | Status: DC | PRN

## 2017-09-03 MED ORDER — LIDOCAINE 2% (20 MG/ML) 5 ML SYRINGE
INTRAMUSCULAR | Status: DC | PRN
  Administered 2017-09-03: 100 mg via INTRAVENOUS

## 2017-09-03 MED ORDER — KCL IN DEXTROSE-NACL 20-5-0.45 MEQ/L-%-% IV SOLN
INTRAVENOUS | Status: DC
  Administered 2017-09-03: 16:00:00 via INTRAVENOUS
  Filled 2017-09-03 (×2): qty 1000

## 2017-09-03 MED ORDER — PROPOFOL 10 MG/ML IV BOLUS
INTRAVENOUS | Status: AC
  Filled 2017-09-03: qty 20

## 2017-09-03 MED ORDER — LABETALOL HCL 5 MG/ML IV SOLN
10.0000 mg | Freq: Once | INTRAVENOUS | Status: AC
  Administered 2017-09-03: 10 mg via INTRAVENOUS

## 2017-09-03 MED ORDER — ONDANSETRON HCL 4 MG/2ML IJ SOLN
INTRAMUSCULAR | Status: DC | PRN
  Administered 2017-09-03: 4 mg via INTRAVENOUS

## 2017-09-03 MED ORDER — HYDRALAZINE HCL 20 MG/ML IJ SOLN
INTRAMUSCULAR | Status: AC
  Filled 2017-09-03: qty 1

## 2017-09-03 MED ORDER — LACTATED RINGERS IV SOLN
INTRAVENOUS | Status: DC
  Administered 2017-09-03 (×2): via INTRAVENOUS

## 2017-09-03 MED ORDER — CEFAZOLIN SODIUM-DEXTROSE 2-4 GM/100ML-% IV SOLN
2.0000 g | INTRAVENOUS | Status: AC
  Administered 2017-09-03: 2 g via INTRAVENOUS
  Filled 2017-09-03: qty 100

## 2017-09-03 MED ORDER — ACETAMINOPHEN 325 MG PO TABS: 650.0000 mg | ORAL_TABLET | Freq: Four times a day (QID) | ORAL | Status: DC | PRN

## 2017-09-03 MED ORDER — PROPOFOL 10 MG/ML IV BOLUS
INTRAVENOUS | Status: DC | PRN
  Administered 2017-09-03: 130 mg via INTRAVENOUS

## 2017-09-03 MED ORDER — ROCURONIUM BROMIDE 10 MG/ML (PF) SYRINGE
PREFILLED_SYRINGE | INTRAVENOUS | Status: DC | PRN
  Administered 2017-09-03: 50 mg via INTRAVENOUS

## 2017-09-03 MED ORDER — LABETALOL HCL 5 MG/ML IV SOLN
5.0000 mg | Freq: Once | INTRAVENOUS | Status: AC
  Administered 2017-09-03: 5 mg via INTRAVENOUS

## 2017-09-03 MED ORDER — CHLORHEXIDINE GLUCONATE CLOTH 2 % EX PADS: 6.0000 | MEDICATED_PAD | Freq: Once | CUTANEOUS | Status: DC

## 2017-09-03 MED ORDER — FENTANYL CITRATE (PF) 100 MCG/2ML IJ SOLN
INTRAMUSCULAR | Status: AC
  Administered 2017-09-03: 25 ug via INTRAVENOUS
  Filled 2017-09-03: qty 2

## 2017-09-03 MED ORDER — LABETALOL HCL 5 MG/ML IV SOLN
INTRAVENOUS | Status: DC | PRN
  Administered 2017-09-03 (×2): 5 mg via INTRAVENOUS

## 2017-09-03 MED ORDER — FENTANYL CITRATE (PF) 100 MCG/2ML IJ SOLN
INTRAMUSCULAR | Status: DC | PRN
  Administered 2017-09-03 (×2): 100 ug via INTRAVENOUS

## 2017-09-03 MED ORDER — SUGAMMADEX SODIUM 200 MG/2ML IV SOLN
INTRAVENOUS | Status: AC
  Filled 2017-09-03: qty 2

## 2017-09-03 MED ORDER — LISINOPRIL 10 MG PO TABS
10.0000 mg | ORAL_TABLET | Freq: Every day | ORAL | Status: DC
  Administered 2017-09-03: 10 mg via ORAL
  Filled 2017-09-03: qty 1

## 2017-09-03 MED ORDER — FENTANYL CITRATE (PF) 100 MCG/2ML IJ SOLN
INTRAMUSCULAR | Status: AC
  Filled 2017-09-03: qty 2

## 2017-09-03 MED ORDER — FENTANYL CITRATE (PF) 100 MCG/2ML IJ SOLN
INTRAMUSCULAR | Status: AC
  Administered 2017-09-03: 50 ug via INTRAVENOUS
  Filled 2017-09-03: qty 2

## 2017-09-03 MED ORDER — SUGAMMADEX SODIUM 200 MG/2ML IV SOLN
INTRAVENOUS | Status: DC | PRN
  Administered 2017-09-03: 200 mg via INTRAVENOUS

## 2017-09-03 MED ORDER — ONDANSETRON HCL 4 MG/2ML IJ SOLN: 4.0000 mg | Freq: Once | INTRAMUSCULAR | Status: DC | PRN

## 2017-09-03 MED ORDER — HYDRALAZINE HCL 20 MG/ML IJ SOLN
INTRAMUSCULAR | Status: DC | PRN
  Administered 2017-09-03: 10 mg via INTRAVENOUS

## 2017-09-03 MED ORDER — HYDROMORPHONE HCL 1 MG/ML IJ SOLN
1.0000 mg | INTRAMUSCULAR | Status: DC | PRN
  Administered 2017-09-04: 1 mg via INTRAVENOUS
  Filled 2017-09-03: qty 1

## 2017-09-03 MED ORDER — 0.9 % SODIUM CHLORIDE (POUR BTL) OPTIME
TOPICAL | Status: DC | PRN
  Administered 2017-09-03: 1000 mL

## 2017-09-03 MED ORDER — ACETAMINOPHEN 650 MG RE SUPP: 650.0000 mg | Freq: Four times a day (QID) | RECTAL | Status: DC | PRN

## 2017-09-03 MED ORDER — LABETALOL HCL 5 MG/ML IV SOLN
5.0000 mg | Freq: Once | INTRAVENOUS | Status: AC
  Administered 2017-09-03: 5 mg via INTRAVENOUS
  Filled 2017-09-03: qty 4

## 2017-09-03 MED ORDER — HYDROCODONE-ACETAMINOPHEN 5-325 MG PO TABS
1.0000 | ORAL_TABLET | ORAL | Status: DC | PRN
  Filled 2017-09-03: qty 1

## 2017-09-03 MED ORDER — TRAMADOL HCL 50 MG PO TABS
50.0000 mg | ORAL_TABLET | Freq: Four times a day (QID) | ORAL | Status: DC | PRN
  Administered 2017-09-03 – 2017-09-04 (×2): 50 mg via ORAL
  Filled 2017-09-03 (×2): qty 1

## 2017-09-03 MED ORDER — ONDANSETRON HCL 4 MG/2ML IJ SOLN
4.0000 mg | Freq: Four times a day (QID) | INTRAMUSCULAR | Status: DC | PRN
  Administered 2017-09-04: 4 mg via INTRAVENOUS
  Filled 2017-09-03 (×2): qty 2

## 2017-09-03 MED ORDER — FUROSEMIDE 10 MG/ML IJ SOLN
INTRAMUSCULAR | Status: AC
  Filled 2017-09-03: qty 2

## 2017-09-03 MED ORDER — METOPROLOL TARTRATE 5 MG/5ML IV SOLN: 5.0000 mg | Freq: Four times a day (QID) | INTRAVENOUS | Status: DC | PRN

## 2017-09-03 MED ORDER — FENTANYL CITRATE (PF) 100 MCG/2ML IJ SOLN
25.0000 ug | INTRAMUSCULAR | Status: DC | PRN
  Administered 2017-09-03: 25 ug via INTRAVENOUS
  Administered 2017-09-03: 50 ug via INTRAVENOUS
  Administered 2017-09-03 (×3): 25 ug via INTRAVENOUS

## 2017-09-03 MED ORDER — FUROSEMIDE 20 MG PO TABS: 20.0000 mg | ORAL_TABLET | Freq: Every day | ORAL | Status: DC

## 2017-09-03 MED ORDER — HYDRALAZINE HCL 20 MG/ML IJ SOLN
10.0000 mg | Freq: Once | INTRAMUSCULAR | Status: AC
  Administered 2017-09-03: 10 mg via INTRAVENOUS

## 2017-09-03 MED ORDER — FUROSEMIDE 10 MG/ML IJ SOLN
20.0000 mg | Freq: Once | INTRAMUSCULAR | Status: AC
  Administered 2017-09-03: 20 mg via INTRAVENOUS

## 2017-09-03 MED ORDER — DEXAMETHASONE SODIUM PHOSPHATE 10 MG/ML IJ SOLN
INTRAMUSCULAR | Status: DC | PRN
  Administered 2017-09-03: 10 mg via INTRAVENOUS

## 2017-09-03 SURGICAL SUPPLY — 42 items
ADH SKN CLS APL DERMABOND .7 (GAUZE/BANDAGES/DRESSINGS)
ATTRACTOMAT 16X20 MAGNETIC DRP (DRAPES) ×3 IMPLANT
BLADE HEX COATED 2.75 (ELECTRODE) ×3 IMPLANT
BLADE SURG 15 STRL LF DISP TIS (BLADE) ×2 IMPLANT
BLADE SURG 15 STRL SS (BLADE) ×3
CHLORAPREP W/TINT 26ML (MISCELLANEOUS) ×6 IMPLANT
CLIP VESOCCLUDE MED 6/CT (CLIP) ×6 IMPLANT
CLIP VESOCCLUDE SM WIDE 6/CT (CLIP) ×6 IMPLANT
CLOSURE STERI-STRIP 1/4X4 (GAUZE/BANDAGES/DRESSINGS) ×1 IMPLANT
DERMABOND ADVANCED (GAUZE/BANDAGES/DRESSINGS)
DERMABOND ADVANCED .7 DNX12 (GAUZE/BANDAGES/DRESSINGS) IMPLANT
DISSECTOR ROUND CHERRY 3/8 STR (MISCELLANEOUS) IMPLANT
DRAPE LAPAROTOMY T 98X78 PEDS (DRAPES) ×3 IMPLANT
ELECT PENCIL ROCKER SW 15FT (MISCELLANEOUS) ×3 IMPLANT
ELECT REM PT RETURN 15FT ADLT (MISCELLANEOUS) ×3 IMPLANT
GAUZE SPONGE 4X4 12PLY STRL (GAUZE/BANDAGES/DRESSINGS) ×1 IMPLANT
GAUZE SPONGE 4X4 16PLY XRAY LF (GAUZE/BANDAGES/DRESSINGS) ×3 IMPLANT
GLOVE SURG ORTHO 8.0 STRL STRW (GLOVE) ×3 IMPLANT
GOWN STRL REUS W/TWL XL LVL3 (GOWN DISPOSABLE) ×9 IMPLANT
HEMOSTAT SURGICEL 2X4 FIBR (HEMOSTASIS) ×3 IMPLANT
ILLUMINATOR WAVEGUIDE N/F (MISCELLANEOUS) IMPLANT
KIT BASIN OR (CUSTOM PROCEDURE TRAY) ×3 IMPLANT
LIGHT WAVEGUIDE WIDE FLAT (MISCELLANEOUS) IMPLANT
NDL HYPO 25X1 1.5 SAFETY (NEEDLE) ×2 IMPLANT
NEEDLE HYPO 25X1 1.5 SAFETY (NEEDLE) ×3 IMPLANT
PACK BASIC VI WITH GOWN DISP (CUSTOM PROCEDURE TRAY) ×3 IMPLANT
POWDER SURGICEL 3.0 GRAM (HEMOSTASIS) IMPLANT
SHEARS HARMONIC 9CM CVD (BLADE) ×3 IMPLANT
STAPLER VISISTAT 35W (STAPLE) ×3 IMPLANT
STRIP CLOSURE SKIN 1/2X4 (GAUZE/BANDAGES/DRESSINGS) ×3 IMPLANT
SUT MNCRL AB 4-0 PS2 18 (SUTURE) ×3 IMPLANT
SUT SILK 2 0 (SUTURE) ×3
SUT SILK 2-0 18XBRD TIE 12 (SUTURE) ×2 IMPLANT
SUT SILK 3 0 (SUTURE)
SUT SILK 3-0 18XBRD TIE 12 (SUTURE) IMPLANT
SUT VIC AB 3-0 SH 18 (SUTURE) ×3 IMPLANT
SYR BULB IRRIGATION 50ML (SYRINGE) ×3 IMPLANT
SYR CONTROL 10ML LL (SYRINGE) ×3 IMPLANT
TAPE CLOTH SURG 4X10 WHT LF (GAUZE/BANDAGES/DRESSINGS) ×1 IMPLANT
TOWEL OR 17X26 10 PK STRL BLUE (TOWEL DISPOSABLE) ×3 IMPLANT
TOWEL OR NON WOVEN STRL DISP B (DISPOSABLE) ×3 IMPLANT
YANKAUER SUCT BULB TIP 10FT TU (MISCELLANEOUS) ×3 IMPLANT

## 2017-09-03 NOTE — Anesthesia Preprocedure Evaluation (Addendum)
Anesthesia Evaluation  Patient identified by MRN, date of birth, ID band Patient awake    Reviewed: Allergy & Precautions, NPO status , Patient's Chart, lab work & pertinent test results  Airway Mallampati: II  TM Distance: >3 FB Neck ROM: Full    Dental  (+) Dental Advisory Given   Pulmonary neg pulmonary ROS,    breath sounds clear to auscultation       Cardiovascular hypertension, Pt. on medications  Rhythm:Regular Rate:Normal     Neuro/Psych negative neurological ROS     GI/Hepatic negative GI ROS, Neg liver ROS,   Endo/Other  negative endocrine ROS  Renal/GU negative Renal ROS     Musculoskeletal   Abdominal   Peds  Hematology negative hematology ROS (+)   Anesthesia Other Findings   Reproductive/Obstetrics                             Lab Results  Component Value Date   WBC 5.0 08/24/2017   HGB 12.6 08/24/2017   HCT 39.1 08/24/2017   MCV 82.8 08/24/2017   PLT 175 08/24/2017   Lab Results  Component Value Date   CREATININE 1.03 (H) 08/24/2017   BUN 13 08/24/2017   NA 142 08/24/2017   K 3.6 08/24/2017   CL 109 08/24/2017   CO2 29 08/24/2017    Anesthesia Physical Anesthesia Plan  ASA: II  Anesthesia Plan: General   Post-op Pain Management:    Induction: Intravenous  PONV Risk Score and Plan: 3 and Ondansetron, Dexamethasone and Treatment may vary due to age or medical condition  Airway Management Planned: Oral ETT  Additional Equipment:   Intra-op Plan:   Post-operative Plan: Extubation in OR  Informed Consent: I have reviewed the patients History and Physical, chart, labs and discussed the procedure including the risks, benefits and alternatives for the proposed anesthesia with the patient or authorized representative who has indicated his/her understanding and acceptance.   Dental advisory given  Plan Discussed with: CRNA  Anesthesia Plan Comments:         Anesthesia Quick Evaluation

## 2017-09-03 NOTE — Progress Notes (Signed)
Dr. Ola Spurr notified that pt bp is 217/84 at this time. No new orders given.

## 2017-09-03 NOTE — Transfer of Care (Signed)
Immediate Anesthesia Transfer of Care Note  Patient: Tracy Rowe  Procedure(s) Performed: NECK EXPLORATION WITH LEFT INFERIOR PARATHYROIDECTOMY AND RIGHT SUPERIOR PARATHYROIDECTOMY (N/A Neck) RIGHT THYROID LOBECTOMY (Right Neck)  Patient Location: PACU  Anesthesia Type:General  Level of Consciousness: awake, alert  and oriented  Airway & Oxygen Therapy: Patient Spontanous Breathing and Patient connected to face mask oxygen  Post-op Assessment: Report given to RN  Post vital signs: Reviewed and stable  Last Vitals:  Vitals:   09/03/17 1050 09/03/17 1250  BP: (!) 217/84   Pulse: 62   Resp: 16   Temp:  36.8 C  SpO2: 100%     Last Pain:  Vitals:   09/03/17 0857  TempSrc: Oral      Patients Stated Pain Goal: 4 (43/83/77 9396)  Complications: No apparent anesthesia complications

## 2017-09-03 NOTE — Interval H&P Note (Signed)
History and Physical Interval Note:  09/03/2017 11:06 AM  Tracy Rowe  has presented today for surgery, with the diagnosis of primary hyperparathyroidism, right thyroid nodule  The various methods of treatment have been discussed with the patient and family. After consideration of risks, benefits and other options for treatment, the patient has consented to    Procedure(s): NECK EXPLORATION WITH PARATHYROIDECTOMY (N/A) POSSIBLE RIGHT THYROID LOBECTOMY (Right) as a surgical intervention .    The patient's history has been reviewed, patient examined, no change in status, stable for surgery.  I have reviewed the patient's chart and labs.  Questions were answered to the patient's satisfaction.    Armandina Gemma, [place] [facility] Office: [place]

## 2017-09-03 NOTE — Brief Op Note (Signed)
09/03/2017  12:40 PM  PATIENT:  Tracy Rowe  72 y.o. female  PRE-OPERATIVE DIAGNOSIS:  primary hyperparathyroidism, right thyroid nodule  POST-OPERATIVE DIAGNOSIS:  primary hyperparathyroidism, right thyroid nodule  PROCEDURE:  Procedure(s): NECK EXPLORATION WITH LEFT INFERIOR PARATHYROIDECTOMY AND RIGHT SUPERIOR PARATHYROIDECTOMY (N/A) RIGHT THYROID LOBECTOMY (Right)  SURGEON:  Surgeon(s) and Role:    * Armandina Gemma, MD - Primary  ANESTHESIA:   general  EBL:  minimal  BLOOD ADMINISTERED:none  DRAINS: none   LOCAL MEDICATIONS USED:  NONE  SPECIMEN:  Excision  DISPOSITION OF SPECIMEN:  PATHOLOGY  COUNTS:  YES  TOURNIQUET:  * No tourniquets in log *  DICTATION: .Other Dictation: Dictation Number [id number]  PLAN OF CARE: Admit for overnight observation  PATIENT DISPOSITION:  PACU - hemodynamically stable.   Delay start of Pharmacological VTE agent (>24hrs) due to surgical blood loss or risk of bleeding: yes  Armandina Gemma, MD [facility] Office: [phone]

## 2017-09-03 NOTE — Anesthesia Procedure Notes (Signed)
Procedure Name: Intubation Date/Time: 09/03/2017 12:56 PM Performed by: Lavina Hamman, CRNA Pre-anesthesia Checklist: Patient identified, Emergency Drugs available, Suction available, Patient being monitored and Timeout performed Patient Re-evaluated:Patient Re-evaluated prior to induction Oxygen Delivery Method: Circle system utilized Preoxygenation: Pre-oxygenation with 100% oxygen Induction Type: IV induction Ventilation: Mask ventilation without difficulty Laryngoscope Size: Mac and 4 Grade View: Grade I Tube type: Oral Tube size: 7.5 mm Number of attempts: 1 Airway Equipment and Method: Stylet Placement Confirmation: ETT inserted through vocal cords under direct vision,  positive ETCO2,  CO2 detector and breath sounds checked- equal and bilateral Secured at: 22 cm Tube secured with: Tape Dental Injury: Teeth and Oropharynx as per pre-operative assessment

## 2017-09-03 NOTE — Anesthesia Postprocedure Evaluation (Signed)
Anesthesia Post Note  Patient: Tracy Rowe  Procedure(s) Performed: NECK EXPLORATION WITH LEFT INFERIOR PARATHYROIDECTOMY AND RIGHT SUPERIOR PARATHYROIDECTOMY (N/A Neck) RIGHT THYROID LOBECTOMY (Right Neck)     Patient location during evaluation: PACU Anesthesia Type: General Level of consciousness: awake and alert Pain management: pain level controlled Vital Signs Assessment: post-procedure vital signs reviewed and stable Respiratory status: spontaneous breathing, nonlabored ventilation, respiratory function stable and patient connected to nasal cannula oxygen Cardiovascular status: blood pressure returned to baseline and stable Postop Assessment: no apparent nausea or vomiting Anesthetic complications: no    Last Vitals:  Vitals:   09/03/17 1415 09/03/17 1420  BP: (!) 186/92 (!) 181/89  Pulse: 74 77  Resp: 18 20  Temp:  36.8 C  SpO2: 100% 98%    Last Pain:  Vitals:   09/03/17 1420  TempSrc:   PainSc: Tyler Deis

## 2017-09-04 ENCOUNTER — Encounter (HOSPITAL_COMMUNITY): Payer: Self-pay | Admitting: Surgery

## 2017-09-04 DIAGNOSIS — E210 Primary hyperparathyroidism: Secondary | ICD-10-CM | POA: Diagnosis not present

## 2017-09-04 LAB — BASIC METABOLIC PANEL
ANION GAP: 9 (ref 5–15)
BUN: 22 mg/dL — ABNORMAL HIGH (ref 6–20)
CALCIUM: 9.9 mg/dL (ref 8.9–10.3)
CO2: 29 mmol/L (ref 22–32)
CREATININE: 1.58 mg/dL — AB (ref 0.44–1.00)
Chloride: 101 mmol/L (ref 101–111)
GFR calc Af Amer: 36 mL/min — ABNORMAL LOW (ref >60)
GFR, EST NON AFRICAN AMERICAN: 31 mL/min — AB (ref >60)
GLUCOSE: 123 mg/dL — AB (ref 65–99)
Potassium: 4.1 mmol/L (ref 3.5–5.1)
Sodium: 139 mmol/L (ref 135–145)

## 2017-09-04 MED ORDER — PROMETHAZINE HCL 12.5 MG PO TABS: 12.5000 mg | ORAL_TABLET | Freq: Four times a day (QID) | ORAL | 0 refills | Status: DC | PRN

## 2017-09-04 MED ORDER — TRAMADOL HCL 50 MG PO TABS: 50.0000 mg | ORAL_TABLET | Freq: Four times a day (QID) | ORAL | 0 refills | Status: DC | PRN

## 2017-09-04 NOTE — Op Note (Signed)
NAMEAIYA, KEACH               ACCOUNT NO.:  [id number]  MEDICAL RECORD NO.:  [id number]  LOCATION:                                 FACILITY:  PHYSICIAN:  Earnstine Regal, MD           DATE OF BIRTH:  DATE OF PROCEDURE:  09/03/2017                              OPERATIVE REPORT   PREOPERATIVE DIAGNOSES: 1. Primary hyperparathyroidism. 2. Right thyroid nodule.  POSTOPERATIVE DIAGNOSES: 1. Primary hyperparathyroidism. 2. Right thyroid nodule.  PROCEDURES: 1. Neck exploration. 2. Left inferior parathyroidectomy. 3. Right superior parathyroidectomy. 4. Right thyroid lobectomy.  SURGEON:  Armandina Gemma, MD  ANESTHESIA:  General.  ESTIMATED BLOOD LOSS:  Minimal.  PREPARATION:  ChloraPrep.  COMPLICATIONS:  None.  INDICATIONS:  The patient is a 72 year old female, referred by her endocrinologist for evaluation of primary hyperparathyroidism.  Nuclear Medicine parathyroid scan identified a left inferior parathyroid adenoma and a possible right superior parathyroid adenoma.  Ultrasound examination confirmed the parathyroid adenoma in the left inferior position and also identified a moderately suspicious right superior thyroid nodule.  Fine-needle aspiration was attempted, but unfortunately insufficient material for diagnosis was obtained.  The patient now comes to Surgery for neck exploration, parathyroidectomy, and right thyroid lobectomy.  BODY OF REPORT:  Procedure was done in OR #5 at the [facility].  The patient was brought to the operating room and placed in supine position on the operating room table.  Following administration of general anesthesia, the patient was positioned and then prepped and draped in the usual aseptic fashion.  After ascertaining that an adequate level of anesthesia had been achieved, a Kocher incision was made with a #15 blade.  Skin flaps were elevated cephalad and caudad from the thyroid notch to the sternal notch.   A [place]orner self-retaining retractor was placed for exposure.  Strap muscles were incised in the midline.  Dissection was begun initially on the left side.  Strap muscles were reflected laterally exposing a normal-appearing left thyroid lobe.  Lobe was gently mobilized and venous tributaries divided with the Harmonic Scalpel between Ligaclips.  There was a markedly enlarged parathyroid gland at the left inferior position.  This was gently mobilized from the surrounding tissues.  Vascular pedicle was divided between medium Ligaclips with the Harmonic scalpel and the gland was excised.  Frozen section biopsy by Dr. Claudette Laws confirmed parathyroid tissue consistent with parathyroid adenoma.  Further dissection in the left neck failed to reveal an enlarged left superior parathyroid gland.  Dry pack was placed in the left neck.  Next, we turned our attention to the right side of the neck.  Strap muscles were reflected laterally.  Right thyroid lobe was mobilized. There was a prominent right superior parathyroid gland present.  This appeared morphologically normal, but may be slightly enlarged.  It was gently mobilized.  Vascular pedicle was divided with the Harmonic scalpel and the gland was excised.  Frozen section biopsy by Dr. Claudette Laws confirmed parathyroid tissue, which appeared relatively normal microscopically.  Palpation of the right thyroid lobe showed a prominent right superior pole nodule.  This was within the parenchyma of the thyroid gland and not visible  on the surface.  A decision was made to proceed with right thyroid lobectomy based on previous ultrasound findings and nuclear medicine findings.  Therefore, the right lobe was mobilized laterally. Venous tributaries were divided between Ligaclips with the Harmonic scalpel.  Superior pole vessels were dissected out and divided between medium Ligaclips with the Harmonic scalpel.  Gland was rolled anteriorly.  Branches  of the inferior thyroid artery were divided between small Ligaclips with the Harmonic scalpel.  Recurrent laryngeal nerve was identified and preserved along its course.  The inferior venous tributaries were divided with the Harmonic scalpel.  Gland was rolled anteriorly onto the trachea.  The isthmus was mobilized across the midline.  There was no significant pyramidal lobe.  Isthmus was transected at the junction of the isthmus and left thyroid lobe with the Harmonic scalpel.  Right thyroid lobe was submitted to Pathology.  A suture was used to mark the superior pole.  Neck was irrigated with warm saline.  Good hemostasis was noted throughout the operative field.  Fibrillar was placed throughout the operative field.  Strap muscles were reapproximated in the midline with interrupted 3-0 Vicryl sutures.  Platysma was closed with interrupted 3- 0 Vicryl sutures.  Skin was closed with a running 4-0 Monocryl subcuticular suture.  Wound was washed and dried and Steri-Strips were applied.  Sterile dressings were applied.  The patient was awakened from anesthesia and brought to the recovery room.  The patient tolerated the procedure well.    Armandina Gemma, [facility] Office: [phone]    TMG/MEDQ  D:  09/03/2017  T:  09/03/2017  Job:  [id number]  cc:   [name], M.D. Fax: [phone]

## 2017-09-04 NOTE — Progress Notes (Signed)
Please contact patient and notify of benign pathology results.  Talha Iser M. Che Rachal, MD, FACS [facility] Office: [phone]   

## 2017-09-04 NOTE — Discharge Summary (Signed)
Physician Discharge Summary [facility]  Patient ID: Tracy Rowe MRN: [id number] DOB/AGE: June 20, 1946 72 y.o.  Admit date: 09/03/2017 Discharge date: 09/04/2017  Admission Diagnoses:  Primary hyperparathyroidism, thyroid nodule  Discharge Diagnoses:  Principal Problem:   Hyperparathyroidism ([place]) Active Problems:   Thyroid nodule   Primary hyperparathyroidism [facility])   Discharged Condition: good  Hospital Course: Patient was admitted for observation following parathyroid and thyroid surgery.  Post op course was uncomplicated.  Pain was well controlled.  Tolerated diet.  Post op calcium level on morning following surgery was 9.9 mg/dl.  Patient was prepared for discharge home on POD#1.  Consults: None  Treatments: surgery: parathyroidectomy (2 glands), right thyroid lobectomy  Discharge Exam: Blood pressure 123/68, pulse 66, temperature 98.7 F (37.1 C), temperature source Oral, resp. rate 16, height 5' 8.5" (1.74 m), weight 81.2 kg (179 lb), SpO2 95 %. HEENT - clear Neck - wound dry and intact; minimal STS Chest - clear bilaterally Cor - RRR  Disposition: Home  Discharge Instructions    Diet - low sodium heart healthy   Complete by:  As directed    Discharge instructions   Complete by:  As directed    [facility]  THYROID & PARATHYROID SURGERY:  POST-OP INSTRUCTIONS  Always review your discharge instruction sheet from the facility where your surgery was performed.  A prescription for pain medication may be given to you upon discharge.  Take your pain medication as prescribed.  If narcotic pain medicine is not needed, then you may take acetaminophen (Tylenol) or ibuprofen (Advil) as needed.  Take your usually prescribed medications unless otherwise directed.  If you need a refill on your pain medication, please contact our office during regular business hours.  Prescriptions will not be processed by our office after 5 pm or on  weekends.  Start with a light diet upon arrival home, such as soup and crackers or toast.  Be sure to drink plenty of fluids daily.  Resume your normal diet the day after surgery.  Most patients will experience some swelling and bruising on the chest and neck area.  Ice packs will help.  Swelling and bruising can take several days to resolve.   It is common to experience some constipation after surgery.  Increasing fluid intake and taking a stool softener (Colace) will usually help or prevent this problem.  A mild laxative (Milk of Magnesia or Miralax) should be taken according to package directions if there has been no bowel movement after 48 hours.  You have steri-strips and a gauze dressing over your incision.  You may remove the gauze bandage on the second day after surgery, and you may shower at that time.  Leave your steri-strips (small skin tapes) in place directly over the incision.  These strips should remain on the skin for 5-7 days and then be removed.  You may get them wet in the shower and pat them dry.  You may resume regular (light) daily activities beginning the next day - such as daily self-care, walking, climbing stairs - gradually increasing activities as tolerated.  You may have sexual intercourse when it is comfortable.  Refrain from any heavy lifting or straining until approved by your doctor.  You may drive when you no longer are taking prescription pain medication, you can comfortably wear a seatbelt, and you can safely maneuver your car and apply brakes.  You should see your doctor in the office for a follow-up appointment approximately three  weeks after your surgery.  Make sure that you call for this appointment within a day or two after you arrive home to insure a convenient appointment time.  WHEN TO CALL YOUR DOCTOR: -- Fever greater than 101.5 -- Inability to urinate -- Nausea and/or vomiting - persistent -- Extreme swelling or bruising -- Continued bleeding from  incision -- Increased pain, redness, or drainage from the incision -- Difficulty swallowing or breathing -- Muscle cramping or spasms -- Numbness or tingling in hands or around lips  The clinic staff is available to answer your questions during regular business hours.  Please don't hesitate to call and ask to speak to one of the nurses if you have concerns.  Earnstine Regal, MD, [facility] Office: [phone]  Website: [email or web address]   Ice pack   Complete by:  As directed    Increase activity slowly   Complete by:  As directed    Remove dressing in 24 hours   Complete by:  As directed      Allergies as of 09/04/2017   No Known Allergies     Medication List    TAKE these medications   atorvastatin 20 MG tablet Commonly known as:  LIPITOR Take 1 tablet (20 mg total) by mouth daily.   furosemide 20 MG tablet Commonly known as:  LASIX Take 1 tablet (20 mg total) by mouth daily.   lisinopril 10 MG tablet Commonly known as:  PRINIVIL,ZESTRIL Take 1 tablet (10 mg total) by mouth daily.   polyethylene glycol powder powder Commonly known as:  GLYCOLAX/MIRALAX Take 17 g by mouth 2 (two) times daily as needed. What changed:  reasons to take this   promethazine 12.5 MG tablet Commonly known as:  PHENERGAN Take 1-2 tablets (12.5-25 mg total) by mouth every 6 (six) hours as needed for nausea or vomiting.   traMADol 50 MG tablet Commonly known as:  ULTRAM Take 1 tablet (50 mg total) by mouth every 6 (six) hours as needed for moderate pain (mild pain).   Vitamin D (Cholecalciferol) 1000 units Caps Take 2,000 Units by mouth daily.      Follow-up Information    Armandina Gemma, MD. Schedule an appointment as soon as possible for a visit in 3 week(s).   Specialty:  General Surgery Contact information: [address] [place] [address] [phone]        [name], MD. Schedule an  appointment as soon as possible for a visit in 4 week(s).   Specialty:  Internal Medicine Contact information: [address] [place] [address] [phone]           Skylyn Slezak M. Rabon Scholle, MD, [facility] Office: [phone]   Signed: Earnstine Regal 09/04/2017, 9:11 AM

## 2017-09-08 ENCOUNTER — Other Ambulatory Visit: Payer: Self-pay | Admitting: Family Medicine

## 2017-09-08 DIAGNOSIS — E782 Mixed hyperlipidemia: Secondary | ICD-10-CM

## 2017-10-26 ENCOUNTER — Encounter: Payer: Self-pay | Admitting: Family Medicine

## 2017-10-26 ENCOUNTER — Ambulatory Visit: Payer: Medicare Other | Admitting: Family Medicine

## 2017-10-26 VITALS — BP 130/80 | HR 60 | Ht 68.0 in | Wt 184.0 lb

## 2017-10-26 DIAGNOSIS — E782 Mixed hyperlipidemia: Secondary | ICD-10-CM | POA: Diagnosis not present

## 2017-10-26 DIAGNOSIS — R61 Generalized hyperhidrosis: Secondary | ICD-10-CM

## 2017-10-26 DIAGNOSIS — M818 Other osteoporosis without current pathological fracture: Secondary | ICD-10-CM

## 2017-10-26 DIAGNOSIS — I10 Essential (primary) hypertension: Secondary | ICD-10-CM | POA: Diagnosis not present

## 2017-10-26 DIAGNOSIS — Z0000 Encounter for general adult medical examination without abnormal findings: Secondary | ICD-10-CM | POA: Diagnosis not present

## 2017-10-26 DIAGNOSIS — E559 Vitamin D deficiency, unspecified: Secondary | ICD-10-CM

## 2017-10-26 DIAGNOSIS — Z206 Contact with and (suspected) exposure to human immunodeficiency virus [HIV]: Secondary | ICD-10-CM | POA: Diagnosis not present

## 2017-10-26 DIAGNOSIS — Z7189 Other specified counseling: Secondary | ICD-10-CM | POA: Diagnosis not present

## 2017-10-26 LAB — POCT URINALYSIS DIP (PROADVANTAGE DEVICE)
BILIRUBIN UA: NEGATIVE mg/dL
Bilirubin, UA: NEGATIVE
Blood, UA: NEGATIVE
Glucose, UA: NEGATIVE mg/dL
Leukocytes, UA: NEGATIVE
Nitrite, UA: NEGATIVE
PROTEIN UA: NEGATIVE mg/dL
Specific Gravity, Urine: 1.015
Urobilinogen, Ur: NEGATIVE
pH, UA: 6 (ref 5.0–8.0)

## 2017-10-26 NOTE — Patient Instructions (Addendum)
Call and schedule a mammogram. Also schedule an eye exam.   I will check your blood work today and have you check with Dr. Cruzita Lederer regarding hot flashes and night sweats also.  We can try a medication and see how you do with this if you are willing after seeing her.   Look over the living will and health care power of attorney forms and get these filled out. Return them at your convenience.   Continue on your current medication.  Check your blood pressure at home every week. Let me know if you are not seeing readings consistently less than 130/80.   Follow up with me after seeing Dr. Cruzita Lederer and we can try a medication for hot flashes.

## 2017-10-26 NOTE — Progress Notes (Signed)
Tracy Rowe is a 72 y.o. female who presents for annual wellness visit and follow-up on chronic medical conditions.  She has the following concerns:  States Dr. Cruzita Lederer is going to manage her osteoporosis at her follow up appointment. this has been delayed due to recent parathyroid issue and recent surgery.Recent surgery was in January 2019.  She had a parathyroidectomy and thyroid lobectomy done by Dr. Harlow Asa. States she is doing well. Continues taking vitamin D daily.   Complains of night sweats for years and occasional hot flashes. States this appears to be getting worse, even since her surgery.   States she is doing well on statin and lisinopril.   Husband has HIV and has had this for years. States they are no longer sexually active.    Immunization History  Administered Date(s) Administered  . Influenza, High Dose Seasonal PF 10/23/2016, 05/22/2017  . Pneumococcal Conjugate-13 10/23/2016  . Tdap 10/30/2016   Last Pap smear: hysterectomy Last mammogram: march 2018 Last colonoscopy: never- cologuard done 12/2016 and negative.  Last DEXA: march 2018 Dentist: years ago. Upper dentures.  Ophtho:  2017 Exercise: walks 2 times per week 2 miles.   Other doctors caring for patient include:  Dr. Cruzita Lederer   Depression screen:  See questionnaire below.  Depression screen [facility] 2/9 10/26/2017 05/22/2017 10/23/2016  Decreased Interest 0 0 0  Down, Depressed, Hopeless 0 0 0  PHQ - 2 Score 0 0 0    Fall Risk Screen: see questionnaire below. Fall Risk  10/26/2017 05/22/2017 10/23/2016  Falls in the past year? No No No    ADL screen:  See questionnaire below Functional Status Survey: Is the patient deaf or have difficulty hearing?: No Does the patient have difficulty seeing, even when wearing glasses/contacts?: No Does the patient have difficulty concentrating, remembering, or making decisions?: No Does the patient have difficulty walking or climbing stairs?: No Does the patient have  difficulty dressing or bathing?: No Does the patient have difficulty doing errands alone such as visiting a doctor's office or shopping?: No   End of Life Discussion:  Patient does not have a living will and medical power of attorney. Discussed this. MOST form filled out.   Review of Systems Constitutional: -fever, -chills, +sweats, -unexpected weight change, -anorexia, -fatigue Allergy: -sneezing, -itching, -congestion Dermatology: denies changing moles, rash, lumps, new worrisome lesions ENT: -runny nose, -ear pain, -sore throat, -hoarseness, -sinus pain, -teeth pain, -tinnitus, -hearing loss, -epistaxis Cardiology:  -chest pain, -palpitations, -edema, -orthopnea, -paroxysmal nocturnal dyspnea Respiratory: -cough, -shortness of breath, -dyspnea on exertion, -wheezing, -hemoptysis Gastroenterology: -abdominal pain, -nausea, -vomiting, -diarrhea, -constipation, -blood in stool, -changes in bowel movement, -dysphagia Hematology: -bleeding or bruising problems Musculoskeletal: -arthralgias, -myalgias, -joint swelling, -back pain, -neck pain, -cramping, -gait changes Ophthalmology: -vision changes, -eye redness, -itching, -discharge Urology: -dysuria, -difficulty urinating, -hematuria, -urinary frequency, -urgency, incontinence Neurology: -headache, -weakness, -tingling, -numbness, -speech abnormality, -memory loss, -falls, -dizziness Psychology:  -depressed mood, -agitation, +sleep problems (hot flashes)    PHYSICAL EXAM:  BP 130/80   Pulse 60   Ht 5\' 8"  (1.727 m)   Wt 184 lb (83.5 kg)   BMI 27.98 kg/m   General Appearance: Alert, cooperative, no distress, appears stated age Head: Normocephalic, without obvious abnormality, atraumatic Eyes: PERRL, conjunctiva/corneas clear, EOM's intact, fundi benign Ears: Normal TM's and external ear canals Nose: Nares normal, mucosa normal, no drainage or sinus tenderness Throat: Lips, mucosa, and tongue normal; teeth and gums normal. Upper  dentures in place  Neck: Supple, no lymphadenopathy;  thyroid-recent surgery, well healed scar: no enlargement/tenderness/nodules; no carotid bruit or JVD Back: Spine nontender, no curvature, ROM normal, no CVA tenderness Lungs: Clear to auscultation bilaterally without wheezes, rales or ronchi; respirations unlabored Chest Wall: No tenderness or deformity Heart: Regular rate and rhythm, S1 and S2 normal, no murmur, rub or gallop Breast Exam: No tenderness, masses, or nipple discharge or inversion. No axillary lymphadenopathy Abdomen: Soft, non-tender, nondistended, normoactive bowel sounds, no masses, no hepatosplenomegaly Genitalia: refused.  Extremities: No clubbing, cyanosis or edema Pulses: 2+ and symmetric all extremities Skin: Skin color, texture, turgor normal, no rashes or lesions Lymph nodes: Cervical, supraclavicular, and axillary nodes normal Neurologic: CNII-XII intact, normal strength, sensation and gait; reflexes 2+ and symmetric throughout Psych: Normal mood, affect, hygiene and grooming.  ASSESSMENT/PLAN: Medicare annual wellness visit, subsequent  Essential hypertension - Plan: CBC with Differential/Platelet, Comprehensive metabolic panel  Other osteoporosis without current pathological fracture  Mixed hyperlipidemia  Vitamin D deficiency  Chronic night sweats - Plan: HIV antibody, TSH  History of exposure to HIV - Plan: HIV antibody  Routine general medical examination at a health care facility - Plan: CBC with Differential/Platelet, Comprehensive metabolic panel, TSH, POCT Urinalysis DIP (Proadvantage Device)  Advance directive discussed with patient  HTN- BP is at goal range. Continue on current medication. She will keep an eye on her BP at home.  Doing well on statin. Recent lipid panel was fine.  Counseling on advance directives done. Discussed MOST form with patient and this was filled out with her in the office. Scanned in MOST form. Advance directives  sent with patient.  HIV screening test ordered. Her husband is HIV pos.  Worsening night sweats and hot flashes- will check labs and have her follow up with endocrinology. Discussed the option of starting her on medication such as Effexor for this and we will hold off for now.  Continue on vitamin D supplement. Recent serum vitamin D level was normal.  She will follow up with Dr. Cruzita Lederer for osteoporosis as well.  She will call and schedule a mammogram.  cologuard was negative and will need repeated in 2021.   Discussed monthly self breast exams and yearly mammograms; at least 30 minutes of aerobic activity at least 5 days/week and weight-bearing exercise 2x/week; proper sunscreen use reviewed; healthy diet, including goals of calcium and vitamin D intake and alcohol recommendations (less than or equal to 1 drink/day) reviewed; regular seatbelt use; changing batteries in smoke detectors.  Immunization recommendations discussed.  Colonoscopy recommendations reviewed   Medicare Attestation I have personally reviewed: The patient's medical and social history Their use of alcohol, tobacco or illicit drugs Their current medications and supplements The patient's functional ability including ADLs,fall risks, home safety risks, cognitive, and hearing and visual impairment Diet and physical activities Evidence for depression or mood disorders  The patient's weight, height, and BMI have been recorded in the chart.  I have made referrals, counseling, and provided education to the patient based on review of the above and I have provided the patient with a written personalized care plan for preventive services.     Harland Dingwall, NP-C   10/26/2017

## 2017-10-27 LAB — COMPREHENSIVE METABOLIC PANEL
ALBUMIN: 4.7 g/dL (ref 3.5–4.8)
ALK PHOS: 73 IU/L (ref 39–117)
ALT: 9 IU/L (ref 0–32)
AST: 14 IU/L (ref 0–40)
Albumin/Globulin Ratio: 1.5 (ref 1.2–2.2)
BILIRUBIN TOTAL: 1.2 mg/dL (ref 0.0–1.2)
BUN / CREAT RATIO: 10 — AB (ref 12–28)
BUN: 11 mg/dL (ref 8–27)
CHLORIDE: 103 mmol/L (ref 96–106)
CO2: 23 mmol/L (ref 20–29)
CREATININE: 1.14 mg/dL — AB (ref 0.57–1.00)
Calcium: 9.8 mg/dL (ref 8.7–10.3)
GFR calc Af Amer: 55 mL/min/{1.73_m2} — ABNORMAL LOW (ref >59)
GFR calc non Af Amer: 48 mL/min/{1.73_m2} — ABNORMAL LOW (ref >59)
GLOBULIN, TOTAL: 3.1 g/dL (ref 1.5–4.5)
GLUCOSE: 93 mg/dL (ref 65–99)
Potassium: 3.5 mmol/L (ref 3.5–5.2)
SODIUM: 145 mmol/L — AB (ref 134–144)
Total Protein: 7.8 g/dL (ref 6.0–8.5)

## 2017-10-27 LAB — CBC WITH DIFFERENTIAL/PLATELET
BASOS ABS: 0 10*3/uL (ref 0.0–0.2)
Basos: 0 %
EOS (ABSOLUTE): 0.1 10*3/uL (ref 0.0–0.4)
Eos: 2 %
HEMOGLOBIN: 13.5 g/dL (ref 11.1–15.9)
Hematocrit: 40.3 % (ref 34.0–46.6)
Immature Grans (Abs): 0 10*3/uL (ref 0.0–0.1)
Immature Granulocytes: 0 %
LYMPHS ABS: 1.6 10*3/uL (ref 0.7–3.1)
Lymphs: 33 %
MCH: 27.2 pg (ref 26.6–33.0)
MCHC: 33.5 g/dL (ref 31.5–35.7)
MCV: 81 fL (ref 79–97)
Monocytes Absolute: 0.3 10*3/uL (ref 0.1–0.9)
Monocytes: 6 %
NEUTROS ABS: 2.8 10*3/uL (ref 1.4–7.0)
Neutrophils: 59 %
Platelets: 225 10*3/uL (ref 150–379)
RBC: 4.97 x10E6/uL (ref 3.77–5.28)
RDW: 14.1 % (ref 12.3–15.4)
WBC: 4.8 10*3/uL (ref 3.4–10.8)

## 2017-10-27 LAB — HIV ANTIBODY (ROUTINE TESTING W REFLEX): HIV SCREEN 4TH GENERATION: NONREACTIVE

## 2017-10-27 LAB — TSH: TSH: 3.93 u[IU]/mL (ref 0.450–4.500)

## 2017-10-28 ENCOUNTER — Other Ambulatory Visit: Payer: Self-pay | Admitting: Family Medicine

## 2017-10-28 DIAGNOSIS — Z1231 Encounter for screening mammogram for malignant neoplasm of breast: Secondary | ICD-10-CM

## 2017-11-23 ENCOUNTER — Ambulatory Visit: Payer: Medicare Other

## 2017-11-23 ENCOUNTER — Ambulatory Visit
Admission: RE | Admit: 2017-11-23 | Discharge: 2017-11-23 | Disposition: A | Discharge status: Home / Self Care | Payer: Medicare Other | Source: Ambulatory Visit | Attending: Family Medicine | Admitting: Family Medicine

## 2017-11-23 DIAGNOSIS — Z1231 Encounter for screening mammogram for malignant neoplasm of breast: Secondary | ICD-10-CM

## 2017-12-16 ENCOUNTER — Other Ambulatory Visit: Payer: Self-pay

## 2017-12-16 DIAGNOSIS — E782 Mixed hyperlipidemia: Secondary | ICD-10-CM

## 2017-12-16 MED ORDER — ATORVASTATIN CALCIUM 20 MG PO TABS: 20.0000 mg | ORAL_TABLET | Freq: Every day | ORAL | 0 refills | Status: DC

## 2017-12-16 NOTE — Telephone Encounter (Signed)
Tracy Rowe has sent a refill request for Atorvastatin #90 day supply

## 2017-12-23 ENCOUNTER — Ambulatory Visit: Payer: Medicare Other | Admitting: Internal Medicine

## 2017-12-23 ENCOUNTER — Encounter: Payer: Self-pay | Admitting: Internal Medicine

## 2017-12-23 VITALS — BP 156/98 | HR 70 | Ht 68.0 in | Wt 186.4 lb

## 2017-12-23 DIAGNOSIS — E041 Nontoxic single thyroid nodule: Secondary | ICD-10-CM

## 2017-12-23 DIAGNOSIS — E210 Primary hyperparathyroidism: Secondary | ICD-10-CM | POA: Diagnosis not present

## 2017-12-23 DIAGNOSIS — Z9889 Other specified postprocedural states: Secondary | ICD-10-CM

## 2017-12-23 DIAGNOSIS — E890 Postprocedural hypothyroidism: Secondary | ICD-10-CM

## 2017-12-23 DIAGNOSIS — M818 Other osteoporosis without current pathological fracture: Secondary | ICD-10-CM

## 2017-12-23 DIAGNOSIS — E559 Vitamin D deficiency, unspecified: Secondary | ICD-10-CM | POA: Diagnosis not present

## 2017-12-23 LAB — TSH: TSH: 4.48 u[IU]/mL (ref 0.35–4.50)

## 2017-12-23 LAB — T3, FREE: T3, Free: 3.2 pg/mL (ref 2.3–4.2)

## 2017-12-23 LAB — VITAMIN D 25 HYDROXY (VIT D DEFICIENCY, FRACTURES): VITD: 39.38 ng/mL (ref 30.00–100.00)

## 2017-12-23 LAB — T4, FREE: FREE T4: 0.79 ng/dL (ref 0.60–1.60)

## 2017-12-23 MED ORDER — LISINOPRIL 10 MG PO TABS: 10.0000 mg | ORAL_TABLET | Freq: Every day | ORAL | 3 refills | Status: DC

## 2017-12-23 MED ORDER — FUROSEMIDE 20 MG PO TABS: 20.0000 mg | ORAL_TABLET | Freq: Every day | ORAL | 3 refills | Status: DC

## 2017-12-23 NOTE — Patient Instructions (Signed)
Please stop at the lab.  Continue vitamin D 2000 units daily.  If we need to start Levothyroxine, take it every day, with water, at least 30 minutes before breakfast, separated by at least 4 hours from: - acid reflux medications - calcium - iron - multivitamins  Please come back for a follow-up appointment in 1 year.

## 2017-12-23 NOTE — Progress Notes (Signed)
Patient ID: Tracy Rowe, female   DOB: [birth year], 72 y.o.   MRN: [id number]    HPI  Tracy Rowe is a 72 y.o. female, returning for follow-up for hypercalcemia/primary hyperparathyroidism, vitamin D deficiency, osteopenia, thyroid nodule. Last visit 4 months ago,  Pt was dx with hypercalcemia in 09/2016.  I reviewed pertinent labs: Lab Results  Component Value Date   PTH 95 (H) 08/14/2017   PTH Comment 08/14/2017   PTH 55 03/02/2017   PTH 142 (H) 11/26/2016   CALCIUM 9.8 10/26/2017   CALCIUM 9.9 09/04/2017   CALCIUM 10.8 (H) 08/24/2017   CALCIUM 10.8 (H) 08/14/2017   CALCIUM 10.9 (H) 05/22/2017   CALCIUM 11.0 (H) 03/02/2017   CALCIUM 11.0 (H) 11/20/2016   CALCIUM 11.1 (H) 09/25/2016   She had an indeterminate Tc sestamibi scan (2002): NO DEFINITE PARATHYROID ADENOPATHY LOCALIZED IN THE NECK OR CHEST ALTHOUGH THERE IS A FOCUS OF MILDLY PROMINENT ACTIVITY AT THE INFERIOR POLE OF THE LEFT LOBE OF THE THYROID WHICH WOULD BE THE MOST LIKELY SITE ON THIS SCAN.  Other labs: Component     Latest Ref Rng & Units 03/02/2017  Vitamin D 1, 25 (OH) Total     18 - 72 pg/mL 68  Vitamin D3 1, 25 (OH)     pg/mL 44  Vitamin D2 1, 25 (OH)     pg/mL 24  Phosphorus     2.3 - 4.6 mg/dL 2.4  Magnesium     1.5 - 2.5 mg/dL 1.8  Normal calcitriol, phosphorus, magnesium  Component     Latest Ref Rng & Units 03/04/2017  Creatinine, Urine     20 - 320 mg/dL 63  Creatinine, 24H Ur     0.63 - 2.50 g/24 h 0.50 (L)  Calcium, Ur     Not estab mg/dL 3  Calcium, 24 hour urine     35 - 250 mg/24 h 24 (L)  Urinary calcium is low, but the creatinine was also low.  I did not ask her to repeat a collection, since even a higher creatinine level will most likely not push her over the limit for hypercalciuria.    Due to persistent hypercalcemia with nonsuppressed PTH and her osteoporosis, I referred her to Dr. Harlow Asa for a second opinion if she needs parathyroid surgery.  He checked the following  tests:  Technetium sestamibi parathyroid scan (05/05/2017): Left inferior parathyroid adenoma and a possible right superior parathyroid adenoma versus thyroid nodule.   Thyroid ultrasound (05/20/2017): Several nodules, of which one right superior measuring 1.6 x 1.3 x 1.3, solid, hypoechoic; another left inferior 1.9 x 1.3 x 1.3 cm, mixed, hypoechoic, presumed to be an enlarged parathyroid gland  FNA of the 1.6 cm nodule (07/08/2017): Scant epithelium (Bethesda category 1)  She had parathyroidectomy + right lobectomy on 09/03/2017.  Left inferior parathyroid adenoma was resected.  The right superior parathyroid did not contain an adenoma.  Right thyroid nodule was benign.  Diagnosis 1. Parathyroid gland, left inferior - HYPERCELLULAR PARATHYROID TISSUE CONSISTENT WITH ADENOMA 2. Parathyroid gland, right superior - PARATHYROID TISSUE 3. Thyroid, lobectomy, right lobe - BENIGN FOLLICULAR ADENOMA (1.2 CM) The right thyroid lobe has an adenomatous lesion which has a very thin fibrous capsule consistent with follicular adenoma. There is no evidence of capsular or vascular invasion. The surrounding thyroid parenchyma is unremarkable.  Calcium normalized after her surgery!  Reviewed patient's DXA scans: Date L1-4 T score FN T score  11/19/2016 L2, L3:-2.4 RFN: -2.3 LFN: -2.3  08/09/2007 L1, L3, L4: -2.0 RFN: n/a LFN: -1.8  Per review of chart, she was approved for Prolia.  No fractures or falls.  No history of kidney stones.  + Mild CKD. Last BUN/Cr: Lab Results  Component Value Date   BUN 11 10/26/2017   BUN 22 (H) 09/04/2017   BUN 13 08/24/2017   BUN 12 05/22/2017   BUN 13 03/02/2017   BUN 22 11/20/2016   BUN 14 09/25/2016   Lab Results  Component Value Date   CREATININE 1.14 (H) 10/26/2017   CREATININE 1.58 (H) 09/04/2017   CREATININE 1.03 (H) 08/24/2017   CREATININE 1.05 (H) 05/22/2017   CREATININE 1.16 (H) 03/02/2017   CREATININE 1.37 (H) 11/20/2016   CREATININE  1.09 (H) 09/25/2016   She was on HCTZ - since 09/2016.  We changed to Lasix.  She also has a history of vitamin D deficiency and she was on ergocalciferol.  She started in 10/2016, and afterwards continued with thousand units daily.  In 04/2017, we increased the dose to 2000 units daily and a subsequent vitamin D level was normal in 07/2017:  Lab Results  Component Value Date   VD25OH 38.66 08/14/2017   VD25OH 26.51 (L) 05/15/2017   VD25OH 31.30 03/02/2017   VD25OH 30 12/29/2016   VD25OH 10 (L) 10/23/2016   Pt does not have a FH of hypercalcemia, pituitary tumors, thyroid cancer, or osteoporosis.   Pt. also has a history of HTN.  ROS: Constitutional: no weight gain/no weight loss, no fatigue, + subjective hyperthermia, no subjective hypothermia Eyes: no blurry vision, no xerophthalmia ENT: no sore throat, no nodules palpated in throat, no dysphagia, no odynophagia, no hoarseness Cardiovascular: no CP/no SOB/no palpitations/no leg swelling Respiratory: no cough/no SOB/no wheezing Gastrointestinal: no N/no V/no D/no C/no acid reflux Musculoskeletal: no muscle aches/no joint aches Skin: no rashes, no hair loss Neurological: no tremors/no numbness/no tingling/no dizziness  I reviewed pt's medications, allergies, PMH, social hx, family hx, and changes were documented in the history of present illness. Otherwise, unchanged from my initial visit note.  Past Medical History:  Diagnosis Date  . HTN (hypertension)   . Hyperlipidemia   . Osteoporosis    Past Surgical History:  Procedure Laterality Date  . ABDOMINAL HYSTERECTOMY    . PARATHYROIDECTOMY N/A 09/03/2017   Procedure: NECK EXPLORATION WITH LEFT INFERIOR PARATHYROIDECTOMY AND RIGHT SUPERIOR PARATHYROIDECTOMY;  Surgeon: Armandina Gemma, MD;  Location: [facility];  Service: General;  Laterality: N/A;  . THYROID LOBECTOMY Right 09/03/2017   Procedure: RIGHT THYROID LOBECTOMY;  Surgeon: Armandina Gemma, MD;  Location: [facility];  Service:  General;  Laterality: Right;   Social History   Socioeconomic History  . Marital status: Married    Spouse name: Not on file  . Number of children: Not on file  . Years of education: Not on file  . Highest education level: Not on file  Occupational History  . Not on file  Social Needs  . Financial resource strain: Not on file  . Food insecurity:    Worry: Not on file    Inability: Not on file  . Transportation needs:    Medical: Not on file    Non-medical: Not on file  Tobacco Use  . Smoking status: Never Smoker  . Smokeless tobacco: Never Used  Substance and Sexual Activity  . Alcohol use: No  . Drug use: No  . Sexual activity: Never  Lifestyle  . Physical activity:    Days per week: Not on file  Minutes per session: Not on file  . Stress: Not on file  Relationships  . Social connections:    Talks on phone: Not on file    Gets together: Not on file    Attends religious service: Not on file    Active member of club or organization: Not on file    Attends meetings of clubs or organizations: Not on file    Relationship status: Not on file  . Intimate partner violence:    Fear of current or ex partner: Not on file    Emotionally abused: Not on file    Physically abused: Not on file    Forced sexual activity: Not on file  Other Topics Concern  . Not on file  Social History Narrative  . Not on file   Current Outpatient Medications on File Prior to Visit  Medication Sig Dispense Refill  . atorvastatin (LIPITOR) 20 MG tablet Take 1 tablet (20 mg total) by mouth daily. 90 tablet 0  . furosemide (LASIX) 20 MG tablet Take 1 tablet (20 mg total) by mouth daily. 90 tablet 3  . lisinopril (PRINIVIL,ZESTRIL) 10 MG tablet Take 1 tablet (10 mg total) by mouth daily. 90 tablet 3  . polyethylene glycol powder (GLYCOLAX/MIRALAX) powder Take 17 g by mouth 2 (two) times daily as needed. (Patient taking differently: Take 17 g by mouth 2 (two) times daily as needed for mild  constipation. ) 3350 g 1  . Vitamin D, Cholecalciferol, 1000 units CAPS Take 2,000 Units by mouth daily.      No current facility-administered medications on file prior to visit.    No Known Allergies Family History  Problem Relation Age of Onset  . Diabetes Sister   . Hypertension Sister   . Hypertension Brother   . Heart disease Brother    PE: BP (!) 156/98   Pulse 70   Ht 5\' 8"  (1.727 m)   Wt 186 lb 6.4 oz (84.6 kg)   SpO2 99%   BMI 28.34 kg/m  Wt Readings from Last 3 Encounters:  12/23/17 186 lb 6.4 oz (84.6 kg)  10/26/17 184 lb (83.5 kg)  09/03/17 179 lb (81.2 kg)   Constitutional: overweight, in NAD Eyes: PERRLA, EOMI, no exophthalmos ENT: moist mucous membranes, cervical scar healed, without dysesthesia, without keloid, no cervical lymphadenopathy Cardiovascular: RRR, No MRG Respiratory: CTA B Gastrointestinal: abdomen soft, NT, ND, BS+ Musculoskeletal: no deformities, strength intact in all 4 Skin: moist, warm, no rashes Neurological: no tremor with outstretched hands, DTR normal in all 4, B [place] sign negative  Assessment: 1. Hypercalcemia/Primary hyperparathyroidism  2.  Osteopenia  3. Vit D def  4.  Thyroid nodule  Plan: Patient with several instances of hypercalcemia with the highest calcium level being 11.1 and also a high PTH level, highest 142.  The hyperparathyroidism persisted even after normalization of her vitamin D.  Of note, she  has CKD, but not severe enough to cause this degree of hyperparathyroidism.  Her 24-hour urine calcium was low, unusual, but not unheard of in the setting of primary hyperparathyroidism. - After investigation pointed towards primary hyperparathyroidism, I referred her to see Dr.  Harlow Asa and further imaging investigation with thyroid uptake and scan pointed towards a left inferior and the right superior parathyroid adenoma.  A thyroid ultrasound showed a left inferior nodule, most consistent with a parathyroid adenoma.   She also had a thyroid nodule found incidentally at that time (please see problem #4) - She had parathyroidectomy 3.5 months  ago: 2 parathyroid glands were removed: Right superior and left inferior.  Only the left inferior gland was adenomatous. - We will check a PTH, calcium, vitamin D today - I will see the patient back in 1 year  2.  Osteopenia - Reviewed the most recent DXA scan reports - She will need a repeat DXA scan in 2020, will order this at next visit - I expect the new scores to  be at least slightly better after her parathyroid surgery - in the meantime we have to make sure that her vitamin D remains normal  3. Vit D def -Continues on 2000 units vitamin D daily - We will recheck her level today  4. Thyroid nodule - reviewed the latest thyroid ultrasound report (04/2017): Right-sided thyroid nodule, with the largest dimension 1.6 cm.  Biopsy was inconclusive due to scant epithelial cells, and we discussed about repeating the biopsy but she refuses to have another biopsy because of the discomfort with the previous one. - She had right lobectomy at the time of her parathyroid surgery and a thyroid nodule was benign -Discussed that after lobectomy, she still has a 25% risk of becoming hypothyroid so we need to follow her TFTs - Reviewed her most recent TSH from 1.5 months ago (approximately 2 months after the surgery) and this were normal.  We will recheck them today. - Discussed about correct intake of levothyroxine in case we need to start  Component     Latest Ref Rng & Units 12/23/2017  Calcium     8.7 - 10.3 mg/dL 9.1  PTH, Intact     15 - 65 pg/mL 52  TSH     0.35 - 4.50 uIU/mL 4.48  VITD     30.00 - 100.00 ng/mL 39.38  Triiodothyronine,Free,Serum     2.3 - 4.2 pg/mL 3.2  T4,Free(Direct)     0.60 - 1.60 ng/dL 0.79   Labs are normal, but TSH close to the ULN. Will repeat TFTs in 6 mo.  [name], MD PhD [facility] Endocrin[facility]

## 2017-12-24 LAB — PTH, INTACT AND CALCIUM
CALCIUM: 9.1 mg/dL (ref 8.7–10.3)
PTH: 52 pg/mL (ref 15–65)

## 2018-02-01 ENCOUNTER — Ambulatory Visit: Payer: Medicare Other | Admitting: Family Medicine

## 2018-02-05 ENCOUNTER — Ambulatory Visit: Payer: Medicare Other | Admitting: Family Medicine

## 2018-02-05 ENCOUNTER — Encounter: Payer: Self-pay | Admitting: Family Medicine

## 2018-02-05 VITALS — BP 130/76 | HR 74 | Ht 68.0 in | Wt 187.0 lb

## 2018-02-05 DIAGNOSIS — Z87891 Personal history of nicotine dependence: Secondary | ICD-10-CM | POA: Diagnosis not present

## 2018-02-05 DIAGNOSIS — I10 Essential (primary) hypertension: Secondary | ICD-10-CM | POA: Diagnosis not present

## 2018-02-05 DIAGNOSIS — R6889 Other general symptoms and signs: Secondary | ICD-10-CM | POA: Diagnosis not present

## 2018-02-05 DIAGNOSIS — E782 Mixed hyperlipidemia: Secondary | ICD-10-CM

## 2018-02-05 NOTE — Progress Notes (Signed)
   Subjective:    Patient ID: Tracy Rowe, female    DOB: 04/10/1946, 72 y.o.   MRN: [id number]  HPI Chief Complaint  Patient presents with  . follow-up    follow-up   She is here to follow up on abnormal PAD screening per recent [employer] home visit.  Denies claudication, edema, numbness, tingling or weakness of her lower extremities.   Hot flashes from time to time.   No issues with medication for HTN or statin. Reports good compliance.   No other complaints of concerns today.    Review of Systems Pertinent positives and negatives in the history of present illness.     Objective:   Physical Exam BP 130/76   Pulse 74   Ht 5\' 8"  (1.727 m)   Wt 187 lb (84.8 kg)   BMI 28.43 kg/m   Alert and in no distress.  Cardiac exam shows a regular sinus rhythm without murmurs or gallops. Lungs are clear to auscultation. Extremities without edema, normal sensation, pulses and ROM. No increased warmth or coolness. Skin is warm and dry.        Assessment & Plan:  Abnormal ankle brachial index - Plan: VAS [place] ABI WITH/WO TBI  Essential hypertension - Plan: VAS [place] ABI WITH/WO TBI  Former smoker - Plan: VAS [place] ABI WITH/WO TBI  Mixed hyperlipidemia - Plan: VAS [place] ABI WITH/WO TBI  ABIs ordered to assess for PAD. Recent [facility] home visit indicated she had moderate PAD. Asymptomatic.  BP is well controlled.  Reports good statin compliance.  Discussed risk factors for vascular disease.  Follow up pending ABI result.

## 2018-02-08 ENCOUNTER — Ambulatory Visit (HOSPITAL_COMMUNITY)
Admission: RE | Admit: 2018-02-08 | Discharge: 2018-02-08 | Disposition: A | Discharge status: Home / Self Care | Payer: Medicare Other | Source: Ambulatory Visit | Attending: Family Medicine | Admitting: Family Medicine

## 2018-02-08 DIAGNOSIS — I10 Essential (primary) hypertension: Secondary | ICD-10-CM | POA: Diagnosis not present

## 2018-02-08 DIAGNOSIS — E782 Mixed hyperlipidemia: Secondary | ICD-10-CM | POA: Diagnosis not present

## 2018-02-08 DIAGNOSIS — Z87891 Personal history of nicotine dependence: Secondary | ICD-10-CM | POA: Diagnosis not present

## 2018-02-08 DIAGNOSIS — R6889 Other general symptoms and signs: Secondary | ICD-10-CM | POA: Diagnosis not present

## 2018-02-08 NOTE — Progress Notes (Signed)
Please let her know that her ABI test result appears to be normal. Nothing further to do.

## 2018-02-08 NOTE — Progress Notes (Signed)
Prelim: Bilateral ABI is WNL. Landry Mellow, RDMS, RVT

## 2018-02-08 NOTE — Progress Notes (Signed)
Pt was notified ABI was normal

## 2018-02-18 ENCOUNTER — Ambulatory Visit: Payer: Medicare Other | Admitting: Internal Medicine

## 2018-03-20 ENCOUNTER — Other Ambulatory Visit: Payer: Self-pay | Admitting: Family Medicine

## 2018-03-20 DIAGNOSIS — E782 Mixed hyperlipidemia: Secondary | ICD-10-CM

## 2018-05-03 ENCOUNTER — Encounter: Payer: Self-pay | Admitting: Family Medicine

## 2018-05-03 ENCOUNTER — Ambulatory Visit: Payer: Medicare Other | Admitting: Family Medicine

## 2018-05-03 VITALS — BP 140/88 | HR 83 | Temp 98.8°F | Resp 16 | Wt 184.8 lb

## 2018-05-03 DIAGNOSIS — R232 Flushing: Secondary | ICD-10-CM

## 2018-05-03 DIAGNOSIS — E041 Nontoxic single thyroid nodule: Secondary | ICD-10-CM | POA: Diagnosis not present

## 2018-05-03 DIAGNOSIS — R058 Other specified cough: Secondary | ICD-10-CM

## 2018-05-03 DIAGNOSIS — E782 Mixed hyperlipidemia: Secondary | ICD-10-CM

## 2018-05-03 DIAGNOSIS — E210 Primary hyperparathyroidism: Secondary | ICD-10-CM

## 2018-05-03 DIAGNOSIS — R05 Cough: Secondary | ICD-10-CM | POA: Diagnosis not present

## 2018-05-03 MED ORDER — ATORVASTATIN CALCIUM 20 MG PO TABS: 20.0000 mg | ORAL_TABLET | Freq: Every day | ORAL | 1 refills | Status: DC

## 2018-05-03 MED ORDER — BENZONATATE 200 MG PO CAPS
200.0000 mg | ORAL_CAPSULE | Freq: Two times a day (BID) | ORAL | 0 refills | Status: DC | PRN
Start: 2018-05-03 — End: 2018-05-26

## 2018-05-03 MED ORDER — AZITHROMYCIN 250 MG PO TABS: ORAL_TABLET | ORAL | 0 refills | Status: DC

## 2018-05-03 NOTE — Patient Instructions (Addendum)
Take the antibiotic as prescribed. Drink plenty of water and you can take guaifenesin (Mucinex) for cough and congestion. You can try the Tessalon as well for cough.   Follow up with me for hot flashes in 3-4 weeks.

## 2018-05-03 NOTE — Progress Notes (Signed)
Subjective:  Tracy Rowe is a 72 y.o. female who presents for a one week history of chest congestion and cough. Also wheezing at night for the past 2 nights.  Initially she had rhinorrhea, nasal congestion, sore throat.  She has also noted her night sweats and hot flashes worsening. No hemoptysis.   Denies fever, chills, dizziness, chest pain, palpitations, abdominal pain, N/V/D.    She is seeing Dr. Letta Median for thyroid and parathyroid dysfunction.   Treatment to date: none.  Denies sick contacts.  No other aggravating or relieving factors.  No other c/o.  ROS as in subjective.   Objective: Vitals:   05/03/18 1429  BP: 140/88  Pulse: 83  Resp: 16  Temp: 98.8 F (37.1 C)  SpO2: 98%    General appearance: Alert, WD/WN, no distress, mildly ill appearing                             Skin: warm, no rash                           Head: no sinus tenderness                            Eyes: conjunctiva normal, corneas clear, PERRLA                            Ears: pearly TMs, external ear canals normal                          Nose: septum midline, turbinates swollen, with erythema and clear discharge             Mouth/throat: MMM, tongue normal, mild pharyngeal erythema                           Neck: supple, no adenopathy, nontender                          Heart: RRR, normal S1, S2, no murmurs                         Lungs: CTA bilaterally, no wheezes, rales, or rhonchi      Assessment: Productive cough - Plan: azithromycin (ZITHROMAX Z-PAK) 250 MG tablet, benzonatate (TESSALON) 200 MG capsule, CBC with Differential/Platelet  Mixed hyperlipidemia - Plan: atorvastatin (LIPITOR) 20 MG tablet  Hot flashes - Plan: Comprehensive metabolic panel, TSH, T4, free, T3  Thyroid nodule - Plan: Comprehensive metabolic panel, TSH, T4, free, T3  Primary hyperparathyroidism (HCC) - Plan: Comprehensive metabolic panel, TSH, T4, free, T3    Plan: Discussed diagnosis and treatment of URI  with productive cough. Z-pak and Tessalon prescribed. Check labs due to thyroid and parathyroid history along with hot flashes and night sweats.   She may take Mucinex for cough as well. Stay hydrated.  Discussed that hopefully her hot flashes will improve with illness.  Follow up in 3-4 weeks for hot flashes. She has tried [employer] without relief. Consider medication such as Effexor.

## 2018-05-04 ENCOUNTER — Other Ambulatory Visit: Payer: Self-pay | Admitting: Family Medicine

## 2018-05-04 DIAGNOSIS — E870 Hyperosmolality and hypernatremia: Secondary | ICD-10-CM

## 2018-05-04 DIAGNOSIS — R7989 Other specified abnormal findings of blood chemistry: Secondary | ICD-10-CM

## 2018-05-04 LAB — COMPREHENSIVE METABOLIC PANEL
A/G RATIO: 1.6 (ref 1.2–2.2)
ALBUMIN: 4.5 g/dL (ref 3.5–4.8)
ALT: 7 IU/L (ref 0–32)
AST: 11 IU/L (ref 0–40)
Alkaline Phosphatase: 59 IU/L (ref 39–117)
BILIRUBIN TOTAL: 1 mg/dL (ref 0.0–1.2)
BUN/Creatinine Ratio: 11 — ABNORMAL LOW (ref 12–28)
BUN: 14 mg/dL (ref 8–27)
CALCIUM: 9.5 mg/dL (ref 8.7–10.3)
CHLORIDE: 108 mmol/L — AB (ref 96–106)
CO2: 25 mmol/L (ref 20–29)
Creatinine, Ser: 1.28 mg/dL — ABNORMAL HIGH (ref 0.57–1.00)
GFR calc Af Amer: 48 mL/min/{1.73_m2} — ABNORMAL LOW (ref >59)
GFR calc non Af Amer: 41 mL/min/{1.73_m2} — ABNORMAL LOW (ref >59)
Globulin, Total: 2.9 g/dL (ref 1.5–4.5)
Glucose: 96 mg/dL (ref 65–99)
Potassium: 3.8 mmol/L (ref 3.5–5.2)
SODIUM: 147 mmol/L — AB (ref 134–144)
TOTAL PROTEIN: 7.4 g/dL (ref 6.0–8.5)

## 2018-05-04 LAB — CBC WITH DIFFERENTIAL/PLATELET
BASOS: 1 %
Basophils Absolute: 0.1 10*3/uL (ref 0.0–0.2)
EOS (ABSOLUTE): 0.7 10*3/uL — AB (ref 0.0–0.4)
EOS: 8 %
HEMATOCRIT: 39.4 % (ref 34.0–46.6)
HEMOGLOBIN: 13 g/dL (ref 11.1–15.9)
IMMATURE GRANS (ABS): 0 10*3/uL (ref 0.0–0.1)
IMMATURE GRANULOCYTES: 0 %
LYMPHS: 19 %
Lymphocytes Absolute: 1.8 10*3/uL (ref 0.7–3.1)
MCH: 27.3 pg (ref 26.6–33.0)
MCHC: 33 g/dL (ref 31.5–35.7)
MCV: 83 fL (ref 79–97)
MONOCYTES: 6 %
Monocytes Absolute: 0.6 10*3/uL (ref 0.1–0.9)
NEUTROS ABS: 6.2 10*3/uL (ref 1.4–7.0)
NEUTROS PCT: 66 %
PLATELETS: 211 10*3/uL (ref 150–450)
RBC: 4.76 x10E6/uL (ref 3.77–5.28)
RDW: 13.5 % (ref 12.3–15.4)
WBC: 9.4 10*3/uL (ref 3.4–10.8)

## 2018-05-04 LAB — T4, FREE: FREE T4: 1.18 ng/dL (ref 0.82–1.77)

## 2018-05-04 LAB — TSH: TSH: 5.63 u[IU]/mL — AB (ref 0.450–4.500)

## 2018-05-04 LAB — T3: T3, Total: 87 ng/dL (ref 71–180)

## 2018-05-07 ENCOUNTER — Other Ambulatory Visit: Payer: Medicare Other

## 2018-05-07 DIAGNOSIS — E870 Hyperosmolality and hypernatremia: Secondary | ICD-10-CM

## 2018-05-07 DIAGNOSIS — R7989 Other specified abnormal findings of blood chemistry: Secondary | ICD-10-CM

## 2018-05-07 LAB — COMPREHENSIVE METABOLIC PANEL
A/G RATIO: 1.4 (ref 1.2–2.2)
ALK PHOS: 64 IU/L (ref 39–117)
ALT: 17 IU/L (ref 0–32)
AST: 24 IU/L (ref 0–40)
Albumin: 4 g/dL (ref 3.5–4.8)
BILIRUBIN TOTAL: 0.6 mg/dL (ref 0.0–1.2)
BUN/Creatinine Ratio: 14 (ref 12–28)
BUN: 18 mg/dL (ref 8–27)
CO2: 23 mmol/L (ref 20–29)
CREATININE: 1.33 mg/dL — AB (ref 0.57–1.00)
Calcium: 9.3 mg/dL (ref 8.7–10.3)
Chloride: 103 mmol/L (ref 96–106)
GFR calc Af Amer: 45 mL/min/{1.73_m2} — ABNORMAL LOW (ref >59)
GFR, EST NON AFRICAN AMERICAN: 39 mL/min/{1.73_m2} — AB (ref >59)
GLOBULIN, TOTAL: 2.9 g/dL (ref 1.5–4.5)
Glucose: 81 mg/dL (ref 65–99)
Potassium: 4 mmol/L (ref 3.5–5.2)
SODIUM: 141 mmol/L (ref 134–144)
Total Protein: 6.9 g/dL (ref 6.0–8.5)

## 2018-05-26 ENCOUNTER — Ambulatory Visit (INDEPENDENT_AMBULATORY_CARE_PROVIDER_SITE_OTHER): Payer: Medicare Other | Admitting: Family Medicine

## 2018-05-26 ENCOUNTER — Telehealth: Payer: Self-pay | Admitting: Internal Medicine

## 2018-05-26 ENCOUNTER — Encounter: Payer: Self-pay | Admitting: Family Medicine

## 2018-05-26 DIAGNOSIS — E210 Primary hyperparathyroidism: Secondary | ICD-10-CM

## 2018-05-26 NOTE — Telephone Encounter (Signed)
Please advise 

## 2018-05-26 NOTE — Telephone Encounter (Signed)
Patient is came in stating they are having symptoms of hot flashes and night sweats. Stating primary care would like patient to come in to check thyroid again. Please advise with Dr and patient, thanks. Ph# [phone]

## 2018-05-26 NOTE — Telephone Encounter (Signed)
The labs were not indicative of excessive thyroid hormones when checked on 05/03/2018. We can repeat them: can you please order a TSH, free T4 and free T3?

## 2018-05-27 NOTE — Addendum Note (Signed)
Addended by: Cardell Peach I on: 05/27/2018 01:36 PM   Modules accepted: Orders

## 2018-05-27 NOTE — Telephone Encounter (Signed)
Labs ordered.  I called patient, no answer and her VM is not set up.

## 2018-05-31 ENCOUNTER — Other Ambulatory Visit (INDEPENDENT_AMBULATORY_CARE_PROVIDER_SITE_OTHER): Payer: Medicare Other

## 2018-05-31 DIAGNOSIS — E210 Primary hyperparathyroidism: Secondary | ICD-10-CM

## 2018-05-31 LAB — TSH: TSH: 5 u[IU]/mL — ABNORMAL HIGH (ref 0.35–4.50)

## 2018-05-31 LAB — T4, FREE: FREE T4: 0.81 ng/dL (ref 0.60–1.60)

## 2018-05-31 LAB — T3, FREE: T3, Free: 2.5 pg/mL (ref 2.3–4.2)

## 2018-05-31 NOTE — Telephone Encounter (Signed)
Patient came by clinic and was notified that she needs labs.

## 2018-06-02 ENCOUNTER — Telehealth: Payer: Self-pay

## 2018-06-02 NOTE — Telephone Encounter (Signed)
Called patient, it sounded like someone answered but then it was disconnected. Will call later.

## 2018-06-02 NOTE — Telephone Encounter (Signed)
-----   Message from [name], MD sent at 06/01/2018  4:58 PM EDT ----- Lenna Sciara, can you please call pt: Thyroid tests are similar to before, even a little better.  Her TSH is only slightly above the upper limit of normal, but no intervention is needed for now.  This thyroid profile will NOT be conducive to hot flashes or night sweats.

## 2018-06-07 NOTE — Telephone Encounter (Signed)
Notified patient of message from Dr. Gherghe, patient expressed understanding and agreement. No further questions.  

## 2018-06-10 ENCOUNTER — Other Ambulatory Visit: Payer: Self-pay

## 2018-06-10 NOTE — Patient Outreach (Signed)
[facility]) Care Management  06/10/2018  Tracy Rowe 09-10-1945 [id number]   Medication Adherence call to [name] left a message for patient to call back patient is due on Lisinopril 10 mg. Mrs, [name] is showing past due under [place].   [facility] Management Direct Dial [phone]  Fax [phone] [name]

## 2018-06-14 ENCOUNTER — Other Ambulatory Visit (INDEPENDENT_AMBULATORY_CARE_PROVIDER_SITE_OTHER): Payer: Medicare Other

## 2018-06-14 DIAGNOSIS — Z23 Encounter for immunization: Secondary | ICD-10-CM

## 2018-06-28 ENCOUNTER — Ambulatory Visit: Payer: Medicare Other | Admitting: Family Medicine

## 2018-06-28 ENCOUNTER — Encounter: Payer: Self-pay | Admitting: Family Medicine

## 2018-06-28 VITALS — BP 122/82 | HR 93 | Temp 98.6°F | Resp 16 | Wt 187.6 lb

## 2018-06-28 DIAGNOSIS — J019 Acute sinusitis, unspecified: Secondary | ICD-10-CM | POA: Diagnosis not present

## 2018-06-28 DIAGNOSIS — R0981 Nasal congestion: Secondary | ICD-10-CM

## 2018-06-28 MED ORDER — AMOXICILLIN 875 MG PO TABS: 875.0000 mg | ORAL_TABLET | Freq: Two times a day (BID) | ORAL | 0 refills | Status: DC

## 2018-06-28 MED ORDER — CHLORPHEN-PE-ACETAMINOPHEN 4-10-325 MG PO TABS: 1.0000 | ORAL_TABLET | Freq: Four times a day (QID) | ORAL | 0 refills | Status: DC | PRN

## 2018-06-28 NOTE — Progress Notes (Signed)
Chief Complaint  Patient presents with  . sick    stopped up and can't breath.     Subjective:  Tracy Rowe is a 72 y.o. female who presents for a one week history of rhinorrhea, nasal congestion, post nasal drainage, dry cough. States her main concern is severe nasal congestion.   Denies fever, chills, headache, sinus pain, sore throat, chest palpitations, shortness of breath, wheezing, abdominal pain, N/V/D.   Treatment to date: OTC multi-symptom cold medication .  Denies sick contacts.  No other aggravating or relieving factors.  No other c/o.  ROS as in subjective.   Objective: Vitals:   06/28/18 1331  BP: 122/82  Pulse: 93  Resp: 16  Temp: 98.6 F (37 C)  SpO2: 98%    General appearance: Alert, WD/WN, no distress, mildly ill appearing                             Skin: warm, no rash                           Head: no sinus tenderness                            Eyes: conjunctiva red with clear drainage, corneas clear, PERRLA                            Ears: pearly TMs, clear fluid behind left TM, external ear canals normal                          Nose: septum midline, turbinates swollen, with erythema and thick discharge             Mouth/throat: MMM, tongue normal, mild pharyngeal erythema                           Neck: supple, no adenopathy, no thyromegaly, nontender                          Heart: RRR, normal S1, S2, no murmurs                         Lungs: CTA bilaterally, no wheezes, rales, or rhonchi      Assessment: Acute non-recurrent sinusitis, unspecified location - Plan: amoxicillin (AMOXIL) 875 MG tablet, Chlorphen-PE-Acetaminophen (NOREL AD) 4-10-325 MG TABS  Nasal congestion - Plan: Chlorphen-PE-Acetaminophen (NOREL AD) 4-10-325 MG TABS    Plan: Discussed diagnosis and treatment of acute sinusitis. Amoxicillin prescribed and Norel AD samples given #12 to take 1 tablet q6 h. Suggested symptomatic OTC remedies. She may use Afrin for the next 24-36  hours and then stop. Discussed potential for rebound congestion. Nasal saline spray for congestion.  Tylenol or Ibuprofen OTC for fever and malaise.  Call/return if symptoms are worsening or not back to baseline after completing the antibiotic.

## 2018-06-28 NOTE — Patient Instructions (Signed)
Take the Norel AD. 1 tablet every 4-6 hours as needed for congestion and drainage. This also has Tylenol in it so do not take additional Tylenol.   Stay well hydrated.   Use Afrin nasal spray for the next 24 to 36 hours and then stop. Do not use more than recommended.   Take the antibiotic as prescribed or hold off for the next 2-3 days and see if you are feeling better. If you get worse or if you are not any better, start the antibiotic.

## 2018-07-01 ENCOUNTER — Encounter: Payer: Self-pay | Admitting: Family Medicine

## 2018-07-01 ENCOUNTER — Ambulatory Visit: Payer: Medicare Other | Admitting: Family Medicine

## 2018-07-01 VITALS — BP 130/82 | HR 76 | Temp 98.6°F | Resp 16

## 2018-07-01 DIAGNOSIS — J0140 Acute pansinusitis, unspecified: Secondary | ICD-10-CM

## 2018-07-01 DIAGNOSIS — R0981 Nasal congestion: Secondary | ICD-10-CM | POA: Diagnosis not present

## 2018-07-01 MED ORDER — PREDNISONE 10 MG (21) PO TBPK: ORAL_TABLET | Freq: Every day | ORAL | 0 refills | Status: DC

## 2018-07-01 MED ORDER — FLUTICASONE PROPIONATE 50 MCG/ACT NA SUSP: 2.0000 | Freq: Every day | NASAL | 1 refills | Status: AC

## 2018-07-01 NOTE — Patient Instructions (Addendum)
Start using the Flonase as prescribed. Stop the Afrin.  Take the steroids as prescribed. Drink extra water. Continue on the Amoxicillin.   You may want to try using a neti pot over the counter. Saline nasal spray as well.   Follow up if not back to baseline after finishing the antibiotic and steroids.

## 2018-07-01 NOTE — Progress Notes (Signed)
Chief Complaint  Patient presents with  . still sick    still sick, no better. cough alittle, SOB, trouble sleeping, stopped up, sinus pressure near the nose, used afrin spray 2-3 times, noral AD, amoxicillin. feels nothing working    Subjective:  Tracy Rowe is a 72 y.o. female who presents for worsening sinus pressure, nasal congestion, cough and fatigue.   Denies fever, chills, ear pain, sore throat, chest pain, palpitations, shortness of breath, orthopnea, wheezing, abdominal pain, N/V/D.   Treatment to date: antibiotics, antihistamines, cough suppressants and decongestants.  Denies sick contacts.  No other aggravating or relieving factors.  No other c/o.  ROS as in subjective.   Objective: Vitals:   07/01/18 1438  BP: 130/82  Pulse: 76  Resp: 16  Temp: 98.6 F (37 C)  SpO2: 98%    General appearance: Alert, WD/WN, no distress, mildly ill appearing                             Skin: warm, no rash                           Head: + sinus tenderness                            Eyes: conjunctiva normal, corneas clear, PERRLA                            Ears: pearly TMs, external ear canals normal                          Nose: septum midline, turbinates swollen, with erythema and thick discharge             Mouth/throat: MMM, tongue normal, mild pharyngeal erythema                           Neck: supple, no adenopathy, no thyromegaly, nontender                          Heart: RRR, normal S1, S2, no murmurs                         Lungs: CTA bilaterally, no wheezes, rales, or rhonchi      Assessment: Acute non-recurrent pansinusitis - Plan: fluticasone (FLONASE) 50 MCG/ACT nasal spray, predniSONE (STERAPRED UNI-PAK 21 TAB) 10 MG (21) TBPK tablet  Nasal congestion - Plan: fluticasone (FLONASE) 50 MCG/ACT nasal spray, predniSONE (STERAPRED UNI-PAK 21 TAB) 10 MG (21) TBPK tablet    Plan: Discussed diagnosis and treatment of acute sinusitis. She denies improvement in  symptoms with Amoxil, Norel AD and Afrin. Will have her try Flonase and oral steroids. She appears sicker than 2 days ago. Continue antibiotic and Norel AD. Stop Afrin.  Nasal saline spray for congestion.  Tylenol or Ibuprofen OTC for fever and malaise.  Call/return if not back to baseline after completing medications.

## 2018-08-31 ENCOUNTER — Encounter: Payer: Self-pay | Admitting: Internal Medicine

## 2018-08-31 ENCOUNTER — Ambulatory Visit: Payer: Medicare Other | Admitting: Internal Medicine

## 2018-08-31 VITALS — BP 140/90 | HR 79 | Ht 68.0 in | Wt 191.0 lb

## 2018-08-31 DIAGNOSIS — M818 Other osteoporosis without current pathological fracture: Secondary | ICD-10-CM

## 2018-08-31 DIAGNOSIS — E559 Vitamin D deficiency, unspecified: Secondary | ICD-10-CM

## 2018-08-31 DIAGNOSIS — Z9889 Other specified postprocedural states: Secondary | ICD-10-CM | POA: Diagnosis not present

## 2018-08-31 DIAGNOSIS — E210 Primary hyperparathyroidism: Secondary | ICD-10-CM

## 2018-08-31 DIAGNOSIS — E890 Postprocedural hypothyroidism: Secondary | ICD-10-CM

## 2018-08-31 DIAGNOSIS — E041 Nontoxic single thyroid nodule: Secondary | ICD-10-CM

## 2018-08-31 NOTE — Patient Instructions (Addendum)
Please stop at the lab.  Please continue vitamin D 2000 units daily.  Please call me towards the end of March to order the new Bone Density scan.  Please come back for a follow-up appointment in 6 months.

## 2018-08-31 NOTE — Progress Notes (Addendum)
Patient ID: Tracy Rowe, female   DOB: 1945-10-09, 73 y.o.   MRN: [id number]    HPI  Tracy Rowe is a 73 y.o. female, returning for follow-up for hypercalcemia/primary hyperparathyroidism, vitamin D deficiency, osteopenia, thyroid nodule. Last visit 7 months ago.  She was diagnosed with hypercalcemia in 09/2016 but she had a Tc scan positive for a parathyroid adenoma ever since 2002.  I reviewed pertinent labs: Lab Results  Component Value Date   PTH 52 12/23/2017   PTH Comment 12/23/2017   PTH 95 (H) 08/14/2017   PTH Comment 08/14/2017   PTH 55 03/02/2017   PTH 142 (H) 11/26/2016   CALCIUM 9.3 05/07/2018   CALCIUM 9.5 05/03/2018   CALCIUM 9.1 12/23/2017   CALCIUM 9.8 10/26/2017   CALCIUM 9.9 09/04/2017   CALCIUM 10.8 (H) 08/24/2017   CALCIUM 10.8 (H) 08/14/2017   CALCIUM 10.9 (H) 05/22/2017   CALCIUM 11.0 (H) 03/02/2017   CALCIUM 11.0 (H) 11/20/2016   She had an indeterminate Tc sestamibi scan (2002): NO DEFINITE PARATHYROID ADENOPATHY LOCALIZED IN THE NECK OR CHEST ALTHOUGH THERE IS A FOCUS OF MILDLY PROMINENT ACTIVITY AT THE INFERIOR POLE OF THE LEFT LOBE OF THE THYROID WHICH WOULD BE THE MOST LIKELY SITE ON THIS SCAN.  Other labs: Component     Latest Ref Rng & Units 03/02/2017  Vitamin D 1, 25 (OH) Total     18 - 72 pg/mL 68  Vitamin D3 1, 25 (OH)     pg/mL 44  Vitamin D2 1, 25 (OH)     pg/mL 24  Phosphorus     2.3 - 4.6 mg/dL 2.4  Magnesium     1.5 - 2.5 mg/dL 1.8  Normal calcitriol, phosphorus, magnesium  Component     Latest Ref Rng & Units 03/04/2017  Creatinine, Urine     20 - 320 mg/dL 63  Creatinine, 24H Ur     0.63 - 2.50 g/24 h 0.50 (L)  Calcium, Ur     Not estab mg/dL 3  Calcium, 24 hour urine     35 - 250 mg/24 h 24 (L)  Urinary calcium is low, but the creatinine was also low.  I did not ask her to repeat a collection, since even a higher creatinine level will most likely not push her over the limit for hypercalciuria.    Due to persistent  hypercalcemia with nonsuppressed PTH and her osteoporosis, I referred her to Dr. Harlow Asa for a second opinion if she needs parathyroid surgery.  He checked the following tests.  Technetium sestamibi parathyroid scan (05/05/2017): Left inferior parathyroid adenoma and a possible right superior parathyroid adenoma versus thyroid nodule.   Thyroid ultrasound (05/20/2017): Several nodules, of which one right superior measuring 1.6 x 1.3 x 1.3, solid, hypoechoic; another left inferior 1.9 x 1.3 x 1.3 cm, mixed, hypoechoic, presumed to be an enlarged parathyroid gland  FNA of the 1.6 cm nodule (07/08/2017): Scant epithelium (Bethesda category 1)  She had parathyroidectomy + right lobectomy on 09/03/2017.  Left Inferior Parathyroid Adenoma was resected.  The right superior parathyroid did not contain an adenoma.  Right thyroid nodule was benign.  Diagnosis 1. Parathyroid gland, left inferior - HYPERCELLULAR PARATHYROID TISSUE CONSISTENT WITH ADENOMA 2. Parathyroid gland, right superior - PARATHYROID TISSUE 3. Thyroid, lobectomy, right lobe - BENIGN FOLLICULAR ADENOMA (1.2 CM) The right thyroid lobe has an adenomatous lesion which has a very thin fibrous capsule consistent with follicular adenoma. There is no evidence of capsular or vascular invasion.  The surrounding thyroid parenchyma is unremarkable.  Calcium normalized after his surgery.  Reviewed patient's DXA scans: Date L1-4 T score FN T score  11/19/2016 L2, L3:-2.4 RFN: -2.3 LFN: -2.3  08/09/2007 L1, L3, L4: -2.0 RFN: n/a LFN: -1.8  Per review of chart, she was approved for Prolia.  No falls or fractures.  No history of kidney stones.  + Mild CKD. Last BUN/Cr: Lab Results  Component Value Date   BUN 18 05/07/2018   BUN 14 05/03/2018   BUN 11 10/26/2017   BUN 22 (H) 09/04/2017   BUN 13 08/24/2017   BUN 12 05/22/2017   BUN 13 03/02/2017   BUN 22 11/20/2016   BUN 14 09/25/2016   Lab Results  Component Value Date    CREATININE 1.33 (H) 05/07/2018   CREATININE 1.28 (H) 05/03/2018   CREATININE 1.14 (H) 10/26/2017   CREATININE 1.58 (H) 09/04/2017   CREATININE 1.03 (H) 08/24/2017   CREATININE 1.05 (H) 05/22/2017   CREATININE 1.16 (H) 03/02/2017   CREATININE 1.37 (H) 11/20/2016   CREATININE 1.09 (H) 09/25/2016   She was on HCTZ in the past but we changed to Lasix during the investigation for hyperparathyroidism.  She also has a history of vitamin D deficiency and she was on ergocalciferol.  She started in 10/2016, and afterwards continued with thousand units daily.  In 04/2017, we increased the dose to 2000 units daily and a subsequent vitamin D level was normal.  This remained normal per last check in 12/2017:   Lab Results  Component Value Date   VD25OH 39.38 12/23/2017   VD25OH 38.66 08/14/2017   VD25OH 26.51 (L) 05/15/2017   VD25OH 31.30 03/02/2017   VD25OH 30 12/29/2016   VD25OH 10 (L) 10/23/2016   Pt does not have a FH of osteoporosis.  Pt. also has a history of HTN.  Lab Results  Component Value Date   TSH 5.00 (H) 05/31/2018   TSH 5.630 (H) 05/03/2018   TSH 4.48 12/23/2017   TSH 3.930 10/26/2017   TSH 2.70 11/26/2016   FREET4 0.81 05/31/2018   FREET4 1.18 05/03/2018   FREET4 0.79 12/23/2017   T3FREE 2.5 05/31/2018   T3FREE 3.2 12/23/2017    ROS: Constitutional: no weight gain/no weight loss, no fatigue, + hot flushes, no subjective hypothermia Eyes: no blurry vision, no xerophthalmia ENT: no sore throat, no nodules palpated in neck, no dysphagia, no odynophagia, no hoarseness Cardiovascular: no CP/no SOB/no palpitations/no leg swelling Respiratory: no cough/no SOB/no wheezing Gastrointestinal: no N/no V/no D/no C/no acid reflux Musculoskeletal: no muscle aches/no joint aches Skin: no rashes, no hair loss Neurological: no tremors/no numbness/no tingling/no dizziness  I reviewed pt's medications, allergies, PMH, social hx, family hx, and changes were documented in the history  of present illness. Otherwise, unchanged from my initial visit note.  Past Medical History:  Diagnosis Date  . HTN (hypertension)   . Hyperlipidemia   . Osteoporosis    Past Surgical History:  Procedure Laterality Date  . ABDOMINAL HYSTERECTOMY    . PARATHYROIDECTOMY N/A 09/03/2017   Procedure: NECK EXPLORATION WITH LEFT INFERIOR PARATHYROIDECTOMY AND RIGHT SUPERIOR PARATHYROIDECTOMY;  Surgeon: Armandina Gemma, MD;  Location: [facility];  Service: General;  Laterality: N/A;  . THYROID LOBECTOMY Right 09/03/2017   Procedure: RIGHT THYROID LOBECTOMY;  Surgeon: Armandina Gemma, MD;  Location: [facility];  Service: General;  Laterality: Right;   Social History   Socioeconomic History  . Marital status: Married    Spouse name: Not on file  . Number  of children: Not on file  . Years of education: Not on file  . Highest education level: Not on file  Occupational History  . Not on file  Social Needs  . Financial resource strain: Not on file  . Food insecurity:    Worry: Not on file    Inability: Not on file  . Transportation needs:    Medical: Not on file    Non-medical: Not on file  Tobacco Use  . Smoking status: Never Smoker  . Smokeless tobacco: Never Used  Substance and Sexual Activity  . Alcohol use: No  . Drug use: No  . Sexual activity: Never  Lifestyle  . Physical activity:    Days per week: Not on file    Minutes per session: Not on file  . Stress: Not on file  Relationships  . Social connections:    Talks on phone: Not on file    Gets together: Not on file    Attends religious service: Not on file    Active member of club or organization: Not on file    Attends meetings of clubs or organizations: Not on file    Relationship status: Not on file  . Intimate partner violence:    Fear of current or ex partner: Not on file    Emotionally abused: Not on file    Physically abused: Not on file    Forced sexual activity: Not on file  Other Topics Concern  . Not on file  Social  History Narrative  . Not on file   Current Outpatient Medications on File Prior to Visit  Medication Sig Dispense Refill  . amoxicillin (AMOXIL) 875 MG tablet Take 1 tablet (875 mg total) by mouth 2 (two) times daily. 20 tablet 0  . atorvastatin (LIPITOR) 20 MG tablet Take 1 tablet (20 mg total) by mouth daily. 90 tablet 1  . Chlorphen-PE-Acetaminophen (NOREL AD) 4-10-325 MG TABS Take 1 tablet by mouth every 6 (six) hours as needed. 12 tablet 0  . fluticasone (FLONASE) 50 MCG/ACT nasal spray Place 2 sprays into both nostrils daily. 16 g 1  . furosemide (LASIX) 20 MG tablet Take 1 tablet (20 mg total) by mouth daily. 90 tablet 3  . lisinopril (PRINIVIL,ZESTRIL) 10 MG tablet Take 1 tablet (10 mg total) by mouth daily. 90 tablet 3  . polyethylene glycol powder (GLYCOLAX/MIRALAX) powder Take 17 g by mouth 2 (two) times daily as needed. (Patient taking differently: Take 17 g by mouth 2 (two) times daily as needed for mild constipation. ) 3350 g 1  . predniSONE (STERAPRED UNI-PAK 21 TAB) 10 MG (21) TBPK tablet Take by mouth daily. Use as manufacturer directs. 21 tablet 0  . Vitamin D, Cholecalciferol, 1000 units CAPS Take 2,000 Units by mouth daily.      No current facility-administered medications on file prior to visit.    No Known Allergies Family History  Problem Relation Age of Onset  . Diabetes Sister   . Hypertension Sister   . Hypertension Brother   . Heart disease Brother    PE: BP 140/90   Pulse 79   Ht 5\' 8"  (1.727 m)   Wt 139 lb (63 kg)   SpO2 98%   BMI 21.13 kg/m  Wt Readings from Last 3 Encounters:  08/31/18 139 lb (63 kg)  06/28/18 187 lb 9.6 oz (85.1 kg)  05/26/18 188 lb (85.3 kg)   Constitutional: overweight, in NAD Eyes: PERRLA, EOMI, no exophthalmos ENT: moist mucous membranes, no  neck masses, thyroidectomy scar healed, no cervical lymphadenopathy Cardiovascular: RRR, No MRG Respiratory: CTA B Gastrointestinal: abdomen soft, NT, ND, BS+ Musculoskeletal: no  deformities, strength intact in all 4 Skin: moist, warm, no rashes Neurological: no tremor with outstretched hands, DTR normal in all 4  Assessment: 1. Hypercalcemia/Primary hyperparathyroidism  2.  Osteopenia  3. Vit D def  4.  Thyroid nodule  Plan: Patient with history of primary hyperparathyroidism, with a previous calcium being as high as 11.1 and a PTH as high as 142.  The hyperparathyroidism persisted even after normalization of her vitamin D.  Of note, she has CKD, but not severe enough to cause this degree of hyperparathyroidism.  Investigation pointed towards a left inferior and right superior parathyroid adenomas.  The thyroid ultrasound showed a left inferior nodule, most consistent with a parathyroid adenoma and she also had a thyroid nodule that was found incidentally at that time of the surgery.  This was benign.  She had 2 gland parathyroidectomy a year ago and the pathology showed that only the left inferior gland was adenomatous. -After surgery, calcium and PTH normalized. -Most recent calcium was normal, 9.3 in 04/2018.  We will not repeat this today.  2.  Osteopenia -Reviewed the most recent DEXA scan report. -She will need another DEXA scan after 11/20/2018 -I expect the new scores to be at least slightly better after her parathyroid surgery. -In the meantime, we have to make sure the vitamin D remains normal  3. Vit D def -Continues on 2000 units vitamin D daily -Latest level was from 12/2017 and it was normal. -We will recheck her level today.  4. Thyroid nodule -Reviewed the latest thyroid ultrasound report from 04/2017: Right-sided thyroid nodule, with the largest dimension 1.6 cm.  Biopsy of this nodule was inconclusive due to scant epithelial cells and we discussed about repeating the biopsy but she refused to have another biopsy because of the discomfort of the previous 1. -She had right thyroidectomy at the time of her parathyroid surgery and the thyroid  nodule was benign -We discussed that after lobectomy she still has a 25% risk of becoming hypothyroid, however, the rate becomes slower as time goes on.  We need to continue to follow her TFTs, though.  At last visit, these were normal.  The TSH was close to the upper limit of normal.  She had a repeat set of TFTs in 05/2018 and at that time the TSH was slightly above the upper limit of normal. -We will repeat these today and we discussed about how to take levothyroxine correctly in case we need to start  Office Visit on 08/31/2018  Component Date Value Ref Range Status  . T3, Free 08/31/2018 2.6  2.3 - 4.2 pg/mL Final  . Free T4 08/31/2018 0.89  0.60 - 1.60 ng/dL Final   Comment: Specimens from patients who are undergoing biotin therapy and /or ingesting biotin supplements may contain high levels of biotin.  The higher biotin concentration in these specimens interferes with this Free T4 assay.  Specimens that contain high levels  of biotin may cause false high results for this Free T4 assay.  Please interpret results in light of the total clinical presentation of the patient.    Marland Kitchen TSH 08/31/2018 4.42  0.35 - 4.50 uIU/mL Final   Normal TFTs. Will repeat them at next OV.  [name], MD PhD [facility]

## 2018-09-01 LAB — TSH: TSH: 4.42 u[IU]/mL (ref 0.35–4.50)

## 2018-09-01 LAB — T4, FREE: Free T4: 0.89 ng/dL (ref 0.60–1.60)

## 2018-09-01 LAB — T3, FREE: T3 FREE: 2.6 pg/mL (ref 2.3–4.2)

## 2018-09-02 ENCOUNTER — Telehealth: Payer: Self-pay

## 2018-09-02 NOTE — Telephone Encounter (Signed)
LMTCB

## 2018-09-02 NOTE — Telephone Encounter (Signed)
-----   Message from [name], MD sent at 09/01/2018 12:11 PM EST ----- Tracy Rowe, can you please call pt: Good news: TFTs are now all normal.

## 2018-09-07 NOTE — Telephone Encounter (Signed)
Letter sent.

## 2018-10-29 ENCOUNTER — Ambulatory Visit: Payer: Medicare Other | Admitting: Family Medicine

## 2018-10-29 ENCOUNTER — Encounter: Payer: Self-pay | Admitting: Family Medicine

## 2018-10-29 VITALS — BP 138/88 | HR 76 | Ht 68.25 in | Wt 193.2 lb

## 2018-10-29 DIAGNOSIS — Z1239 Encounter for other screening for malignant neoplasm of breast: Secondary | ICD-10-CM | POA: Diagnosis not present

## 2018-10-29 DIAGNOSIS — E782 Mixed hyperlipidemia: Secondary | ICD-10-CM

## 2018-10-29 DIAGNOSIS — M818 Other osteoporosis without current pathological fracture: Secondary | ICD-10-CM | POA: Diagnosis not present

## 2018-10-29 DIAGNOSIS — Z7185 Encounter for immunization safety counseling: Secondary | ICD-10-CM

## 2018-10-29 DIAGNOSIS — Z23 Encounter for immunization: Secondary | ICD-10-CM | POA: Diagnosis not present

## 2018-10-29 DIAGNOSIS — E559 Vitamin D deficiency, unspecified: Secondary | ICD-10-CM

## 2018-10-29 DIAGNOSIS — Z0000 Encounter for general adult medical examination without abnormal findings: Secondary | ICD-10-CM | POA: Diagnosis not present

## 2018-10-29 DIAGNOSIS — Z7189 Other specified counseling: Secondary | ICD-10-CM | POA: Diagnosis not present

## 2018-10-29 DIAGNOSIS — I10 Essential (primary) hypertension: Secondary | ICD-10-CM | POA: Diagnosis not present

## 2018-10-29 DIAGNOSIS — R7989 Other specified abnormal findings of blood chemistry: Secondary | ICD-10-CM

## 2018-10-29 DIAGNOSIS — Z79899 Other long term (current) drug therapy: Secondary | ICD-10-CM

## 2018-10-29 LAB — POCT URINALYSIS DIP (PROADVANTAGE DEVICE)
Bilirubin, UA: NEGATIVE
Blood, UA: NEGATIVE
Glucose, UA: NEGATIVE mg/dL
Ketones, POC UA: NEGATIVE mg/dL
LEUKOCYTES UA: NEGATIVE
NITRITE UA: NEGATIVE
Protein Ur, POC: NEGATIVE mg/dL
Specific Gravity, Urine: 1.015
Urobilinogen, Ur: NEGATIVE
pH, UA: 6 (ref 5.0–8.0)

## 2018-10-29 MED ORDER — LISINOPRIL 20 MG PO TABS: 20.0000 mg | ORAL_TABLET | Freq: Every day | ORAL | 2 refills | Status: DC

## 2018-10-29 NOTE — Progress Notes (Signed)
Tracy Rowe is a 73 y.o. female who presents for annual wellness visit, CPE and follow-up on chronic medical conditions.  She has the following concerns:  HTN- reports good compliance with lisinopril 10 mg and Lasix 20 mg. Does not check BP at home.  Recent nurse visit from medicare and her BP was in the 140s/90s.   Elevated serum creatinine in the past. Needs follow up   Osteoporosis- being managed by Dr. Cruzita Lederer due to hyperparathyroidism.  Taking daily statin, no side effects.     Immunization History  Administered Date(s) Administered  . Influenza, High Dose Seasonal PF 10/23/2016, 05/22/2017, 06/14/2018  . Pneumococcal Conjugate-13 10/23/2016  . Pneumococcal Polysaccharide-23 10/29/2018  . Tdap 10/30/2016   Last Pap smear: hysterectomy Last mammogram: 2019 Last colonoscopy: 2018- negative cologuard Last DEXA: 2018 osteoporosis  Dentist: dentures  Ophtho: 2017 Exercise: walking every now and then  Other doctors caring for patient include: Dr. Cruzita Lederer- endocrinology Dr. Harlow Asa   Depression screen:  See questionnaire below.  Depression screen [employer] 2/9 10/29/2018 10/26/2017 05/22/2017 10/23/2016  Decreased Interest 0 0 0 0  Down, Depressed, Hopeless 0 0 0 0  PHQ - 2 Score 0 0 0 0    Fall Risk Screen: see questionnaire below. Fall Risk  10/29/2018 10/26/2017 05/22/2017 10/23/2016  Falls in the past year? 0 No No No  Number falls in past yr: 0 - - -  Injury with Fall? 0 - - -    ADL screen:  See questionnaire below Functional Status Survey: Is the patient deaf or have difficulty hearing?: No Does the patient have difficulty seeing, even when wearing glasses/contacts?: No Does the patient have difficulty concentrating, remembering, or making decisions?: No Does the patient have difficulty walking or climbing stairs?: No Does the patient have difficulty dressing or bathing?: No Does the patient have difficulty doing errands alone such as visiting a doctor's office or shopping?:  No   End of Life Discussion:  Patient has a living will and medical power of attorney. MOST form reviewed.   Review of Systems Constitutional: -fever, -chills, -sweats, -unexpected weight change, -anorexia, -fatigue Allergy: -sneezing, -itching, -congestion Dermatology: denies changing moles, rash, lumps, new worrisome lesions ENT: -runny nose, -ear pain, -sore throat, -hoarseness, -sinus pain, -teeth pain, -tinnitus, -hearing loss, -epistaxis Cardiology:  -chest pain, -palpitations, -edema, -orthopnea, -paroxysmal nocturnal dyspnea Respiratory: -cough, -shortness of breath, -dyspnea on exertion, -wheezing, -hemoptysis Gastroenterology: -abdominal pain, -nausea, -vomiting, -diarrhea, -constipation, -blood in stool, -changes in bowel movement, -dysphagia Hematology: -bleeding or bruising problems Musculoskeletal: -arthralgias, -myalgias, -joint swelling, -back pain, -neck pain, -cramping, -gait changes Ophthalmology: -vision changes, -eye redness, -itching, -discharge Urology: -dysuria, -difficulty urinating, -hematuria, -urinary frequency, -urgency, incontinence Neurology: -headache, -weakness, -tingling, -numbness, -speech abnormality, -memory loss, -falls, -dizziness Psychology:  -depressed mood, -agitation, -sleep problems    PHYSICAL EXAM:  BP 138/88   Pulse 76   Ht 5' 8.25" (1.734 m)   Wt 193 lb 3.2 oz (87.6 kg)   BMI 29.16 kg/m   General Appearance: Alert, cooperative, no distress, appears stated age Head: Normocephalic, without obvious abnormality, atraumatic Eyes: PERRL, conjunctiva/corneas clear, EOM's intact, fundi benign Ears: Normal TM's and external ear canals Nose: Nares normal, mucosa normal, no drainage or sinus tenderness Throat: Lips, mucosa, and tongue normal; teeth and gums normal Neck: Supple, no lymphadenopathy; thyroid: no enlargement/tenderness/nodules; no carotid bruit or JVD Back: Spine nontender, no curvature, ROM normal, no CVA tenderness Lungs:  Clear to auscultation bilaterally without wheezes, rales or ronchi; respirations unlabored Chest Wall:  No tenderness or deformity Heart: Regular rate and rhythm, S1 and S2 normal, no murmur, rub or gallop Breast Exam: declines. Mammogram due Abdomen: Soft, non-tender, nondistended, normoactive bowel sounds, no masses, no hepatosplenomegaly Genitalia: declines. Pap smear not indicated.  Extremities: No clubbing, cyanosis or edema Pulses: 2+ and symmetric all extremities Skin: Skin color, texture, turgor normal, no rashes or lesions Lymph nodes: Cervical, supraclavicular, and axillary nodes normal Neurologic: CNII-XII intact, normal strength, sensation and gait; reflexes 2+ and symmetric throughout Psych: Normal mood, affect, hygiene and grooming.  ASSESSMENT/PLAN: Medicare annual wellness visit, subsequent - Plan: POCT Urinalysis DIP (Proadvantage Device)  Essential hypertension - Plan: CBC with Differential/Platelet, Comprehensive metabolic panel, lisinopril (PRINIVIL,ZESTRIL) 20 MG tablet  Other osteoporosis without current pathological fracture  Vitamin D deficiency  Mixed hyperlipidemia - Plan: Lipid panel  Screening for breast cancer  Need for vaccination against Streptococcus pneumoniae - Plan: Pneumococcal polysaccharide vaccine 23-valent greater than or equal to 2yo subcutaneous/IM  Immunization counseling  Elevated serum creatinine - Plan: Comprehensive metabolic panel  Medication management - Plan: Lipid panel  Routine general medical examination at a health care facility  She appears to be doing well physically and emotionally. Her blood pressure is slightly elevated today and it was also elevated when she had a Medicare nurse visit recently.  We will increase her lisinopril to 20 mg and have her continue on other medications as prescribed.  Encouraged her to buy a blood pressure cuff and keep an eye on her blood pressure at home.  Discussed low-sodium diet. Continue  on statin.  Check lipid panel. To call and schedule her mammogram. She will have a repeat DEXA and then treatment for her osteoporosis determined by Dr. Cruzita Lederer per patient. Immunization counseling was done.  Pneumovax 23 given.  She is up-to-date on pneumonia vaccines, Tdap and flu shot.  A prescription was given for Shingrix.  She will check with her insurance and if it is affordable then she can go to her pharmacy to get this.  Counseling done on side effects. Monitoring kidney function due to elevated serum creatinine. Check vitamin D level due to history of vitamin D deficiency and osteoporosis. Advanced directives discussed.   Discussed monthly self breast exams and yearly mammograms; at least 30 minutes of aerobic activity at least 5 days/week and weight-bearing exercise 2x/week; proper sunscreen use reviewed; healthy diet, including goals of calcium and vitamin D intake and alcohol recommendations (less than or equal to 1 drink/day) reviewed; regular seatbelt use; changing batteries in smoke detectors.  Immunization recommendations discussed.  Colonoscopy recommendations reviewed   Medicare Attestation I have personally reviewed: The patient's medical and social history Their use of alcohol, tobacco or illicit drugs Their current medications and supplements The patient's functional ability including ADLs,fall risks, home safety risks, cognitive, and hearing and visual impairment Diet and physical activities Evidence for depression or mood disorders  The patient's weight, height, and BMI have been recorded in the chart.  I have made referrals, counseling, and provided education to the patient based on review of the above and I have provided the patient with a written personalized care plan for preventive services.     Harland Dingwall, NP-C   10/29/2018

## 2018-10-29 NOTE — Patient Instructions (Addendum)
Your blood pressure has been elevated recently.  I am increasing your lisinopril to 20 mg and you should continue on the Lasix.  Make sure you are eating a low-sodium diet.  Get a blood pressure cuff and start checking your blood pressure at home. Goal blood pressure reading is less than 130/80.  Continue on your other medications as prescribed.  Call your insurance carrier regarding the Shingrix vaccine.  If this is affordable and you decide to get it then you can take the prescription to your pharmacy. Otherwise you are up-to-date on your immunizations.  Call and schedule your mammogram as discussed.    DASH Eating Plan DASH stands for "Dietary Approaches to Stop Hypertension." The DASH eating plan is a healthy eating plan that has been shown to reduce high blood pressure (hypertension). It may also reduce your risk for type 2 diabetes, heart disease, and stroke. The DASH eating plan may also help with weight loss. What are tips for following this plan?  General guidelines  Avoid eating more than 2,300 mg (milligrams) of salt (sodium) a day. If you have hypertension, you may need to reduce your sodium intake to 1,500 mg a day.  Limit alcohol intake to no more than 1 drink a day for nonpregnant women and 2 drinks a day for men. One drink equals 12 oz of beer, 5 oz of wine, or 1 oz of hard liquor.  Work with your health care provider to maintain a healthy body weight or to lose weight. Ask what an ideal weight is for you.  Get at least 30 minutes of exercise that causes your heart to beat faster (aerobic exercise) most days of the week. Activities may include walking, swimming, or biking.  Work with your health care provider or diet and nutrition specialist (dietitian) to adjust your eating plan to your individual calorie needs. Reading food labels   Check food labels for the amount of sodium per serving. Choose foods with less than 5 percent of the Daily Value of sodium.  Generally, foods with less than 300 mg of sodium per serving fit into this eating plan.  To find whole grains, look for the word "whole" as the first word in the ingredient list. Shopping  Buy products labeled as "low-sodium" or "no salt added."  Buy fresh foods. Avoid canned foods and premade or frozen meals. Cooking  Avoid adding salt when cooking. Use salt-free seasonings or herbs instead of table salt or sea salt. Check with your health care provider or pharmacist before using salt substitutes.  Do not fry foods. Cook foods using healthy methods such as baking, boiling, grilling, and broiling instead.  Cook with heart-healthy oils, such as olive, canola, soybean, or sunflower oil. Meal planning  Eat a balanced diet that includes: ? 5 or more servings of fruits and vegetables each day. At each meal, try to fill half of your plate with fruits and vegetables. ? Up to 6-8 servings of whole grains each day. ? Less than 6 oz of lean meat, poultry, or fish each day. A 3-oz serving of meat is about the same size as a deck of cards. One egg equals 1 oz. ? 2 servings of low-fat dairy each day. ? A serving of nuts, seeds, or beans 5 times each week. ? Heart-healthy fats. Healthy fats called Omega-3 fatty acids are found in foods such as flaxseeds and coldwater fish, like sardines, salmon, and mackerel.  Limit how much you eat of the following: ? Canned or  prepackaged foods. ? Food that is high in trans fat, such as fried foods. ? Food that is high in saturated fat, such as fatty meat. ? Sweets, desserts, sugary drinks, and other foods with added sugar. ? Full-fat dairy products.  Do not salt foods before eating.  Try to eat at least 2 vegetarian meals each week.  Eat more home-cooked food and less restaurant, buffet, and fast food.  When eating at a restaurant, ask that your food be prepared with less salt or no salt, if possible. What foods are recommended? The items listed may not  be a complete list. Talk with your dietitian about what dietary choices are best for you. Grains Whole-grain or whole-wheat bread. Whole-grain or whole-wheat pasta. Brown rice. [place]. Bulgur. Whole-grain and low-sodium cereals. Pita bread. Low-fat, low-sodium crackers. Whole-wheat flour tortillas. Vegetables Fresh or frozen vegetables (raw, steamed, roasted, or grilled). Low-sodium or reduced-sodium tomato and vegetable juice. Low-sodium or reduced-sodium tomato sauce and tomato paste. Low-sodium or reduced-sodium canned vegetables. Fruits All fresh, dried, or frozen fruit. Canned fruit in natural juice (without added sugar). Meat and other protein foods Skinless chicken or [place]. Ground chicken or [place]. Pork with fat trimmed off. Fish and seafood. Egg whites. Dried beans, peas, or lentils. Unsalted nuts, nut butters, and seeds. Unsalted canned beans. Lean cuts of beef with fat trimmed off. Low-sodium, lean deli meat. Dairy Low-fat (1%) or fat-free (skim) milk. Fat-free, low-fat, or reduced-fat cheeses. Nonfat, low-sodium ricotta or cottage cheese. Low-fat or nonfat yogurt. Low-fat, low-sodium cheese. Fats and oils Soft margarine without trans fats. Vegetable oil. Low-fat, reduced-fat, or light mayonnaise and salad dressings (reduced-sodium). Canola, safflower, olive, soybean, and sunflower oils. Avocado. Seasoning and other foods Herbs. Spices. Seasoning mixes without salt. Unsalted popcorn and pretzels. Fat-free sweets. What foods are not recommended? The items listed may not be a complete list. Talk with your dietitian about what dietary choices are best for you. Grains Baked goods made with fat, such as croissants, muffins, or some breads. Dry pasta or rice meal packs. Vegetables Creamed or fried vegetables. Vegetables in a cheese sauce. Regular canned vegetables (not low-sodium or reduced-sodium). Regular canned tomato sauce and paste (not low-sodium or reduced-sodium). Regular  tomato and vegetable juice (not low-sodium or reduced-sodium). Angie Fava. Olives. Fruits Canned fruit in a light or heavy syrup. Fried fruit. Fruit in cream or butter sauce. Meat and other protein foods Fatty cuts of meat. Ribs. Fried meat. Berniece Salines. Sausage. Bologna and other processed lunch meats. Salami. Fatback. Hotdogs. Bratwurst. Salted nuts and seeds. Canned beans with added salt. Canned or smoked fish. Whole eggs or egg yolks. Chicken or [place] with skin. Dairy Whole or 2% milk, cream, and half-and-half. Whole or full-fat cream cheese. Whole-fat or sweetened yogurt. Full-fat cheese. Nondairy creamers. Whipped toppings. Processed cheese and cheese spreads. Fats and oils Butter. Stick margarine. Lard. Shortening. Ghee. Bacon fat. Tropical oils, such as coconut, palm kernel, or palm oil. Seasoning and other foods Salted popcorn and pretzels. Onion salt, garlic salt, seasoned salt, table salt, and sea salt. Worcestershire sauce. Tartar sauce. Barbecue sauce. Teriyaki sauce. Soy sauce, including reduced-sodium. Steak sauce. Canned and packaged gravies. Fish sauce. Oyster sauce. Cocktail sauce. Horseradish that you find on the shelf. Ketchup. Mustard. Meat flavorings and tenderizers. Bouillon cubes. Hot sauce and Tabasco sauce. Premade or packaged marinades. Premade or packaged taco seasonings. Relishes. Regular salad dressings. Where to find more information:  National Heart, Lung, and [place]: https://wilson-eaton.com/  American Heart Association: www.heart.org Summary  The DASH  eating plan is a healthy eating plan that has been shown to reduce high blood pressure (hypertension). It may also reduce your risk for type 2 diabetes, heart disease, and stroke.  With the DASH eating plan, you should limit salt (sodium) intake to 2,300 mg a day. If you have hypertension, you may need to reduce your sodium intake to 1,500 mg a day.  When on the DASH eating plan, aim to eat more fresh fruits and  vegetables, whole grains, lean proteins, low-fat dairy, and heart-healthy fats.  Work with your health care provider or diet and nutrition specialist (dietitian) to adjust your eating plan to your individual calorie needs. This information is not intended to replace advice given to you by your health care provider. Make sure you discuss any questions you have with your health care provider. Document Released: 07/31/2011 Document Revised: 08/04/2016 Document Reviewed: 08/04/2016 Elsevier Interactive Patient Education  2019 [employer].

## 2018-10-30 LAB — CBC WITH DIFFERENTIAL/PLATELET
Basophils Absolute: 0 10*3/uL (ref 0.0–0.2)
Basos: 1 %
EOS (ABSOLUTE): 0.2 10*3/uL (ref 0.0–0.4)
Eos: 4 %
Hematocrit: 41.3 % (ref 34.0–46.6)
Hemoglobin: 13.2 g/dL (ref 11.1–15.9)
Immature Grans (Abs): 0 10*3/uL (ref 0.0–0.1)
Immature Granulocytes: 0 %
Lymphocytes Absolute: 1.1 10*3/uL (ref 0.7–3.1)
Lymphs: 26 %
MCH: 26 pg — ABNORMAL LOW (ref 26.6–33.0)
MCHC: 32 g/dL (ref 31.5–35.7)
MCV: 81 fL (ref 79–97)
Monocytes Absolute: 0.3 10*3/uL (ref 0.1–0.9)
Monocytes: 7 %
Neutrophils Absolute: 2.7 10*3/uL (ref 1.4–7.0)
Neutrophils: 62 %
PLATELETS: 192 10*3/uL (ref 150–450)
RBC: 5.08 x10E6/uL (ref 3.77–5.28)
RDW: 13.2 % (ref 11.7–15.4)
WBC: 4.3 10*3/uL (ref 3.4–10.8)

## 2018-10-30 LAB — COMPREHENSIVE METABOLIC PANEL
A/G RATIO: 1.5 (ref 1.2–2.2)
ALT: 25 IU/L (ref 0–32)
AST: 37 IU/L (ref 0–40)
Albumin: 4.4 g/dL (ref 3.7–4.7)
Alkaline Phosphatase: 73 IU/L (ref 39–117)
BILIRUBIN TOTAL: 1.1 mg/dL (ref 0.0–1.2)
BUN/Creatinine Ratio: 9 — ABNORMAL LOW (ref 12–28)
BUN: 11 mg/dL (ref 8–27)
CHLORIDE: 105 mmol/L (ref 96–106)
CO2: 26 mmol/L (ref 20–29)
Calcium: 9.2 mg/dL (ref 8.7–10.3)
Creatinine, Ser: 1.16 mg/dL — ABNORMAL HIGH (ref 0.57–1.00)
GFR calc non Af Amer: 46 mL/min/{1.73_m2} — ABNORMAL LOW (ref >59)
GFR, EST AFRICAN AMERICAN: 54 mL/min/{1.73_m2} — AB (ref >59)
GLUCOSE: 97 mg/dL (ref 65–99)
Globulin, Total: 2.9 g/dL (ref 1.5–4.5)
Potassium: 3.5 mmol/L (ref 3.5–5.2)
Sodium: 146 mmol/L — ABNORMAL HIGH (ref 134–144)
Total Protein: 7.3 g/dL (ref 6.0–8.5)

## 2018-10-30 LAB — LIPID PANEL
Chol/HDL Ratio: 3 ratio (ref 0.0–4.4)
Cholesterol, Total: 143 mg/dL (ref 100–199)
HDL: 47 mg/dL (ref >39)
LDL Calculated: 78 mg/dL (ref 0–99)
Triglycerides: 90 mg/dL (ref 0–149)
VLDL CHOLESTEROL CAL: 18 mg/dL (ref 5–40)

## 2018-11-02 ENCOUNTER — Other Ambulatory Visit: Payer: Self-pay | Admitting: Family Medicine

## 2018-11-02 DIAGNOSIS — Z1231 Encounter for screening mammogram for malignant neoplasm of breast: Secondary | ICD-10-CM

## 2018-12-03 ENCOUNTER — Ambulatory Visit: Payer: Medicare Other

## 2018-12-23 IMAGING — NM NM PARATHYROID W/ SPECT
5 series · 20 of 20 positions shown · non-contrast
Comparison: None

CLINICAL DATA: Hyperparathyroidism, elevated parathormone and
calcium

EXAM:
NM PARATHYROID SCINTIGRAPHY AND SPECT IMAGING
TECHNIQUE: Following intravenous administration of radiopharmaceutical, early
and 2-hour delayed planar images were obtained in the anterior
projection. Delayed triplanar SPECT images were also obtained at 2
hours.
RADIOPHARMACEUTICALS:  25.5 mCi Pc-UUm Sestamibi IV

[Series 1: 15 min ant · 4.14mm/px · 1 of 1 slices shown]
[im 1/1]
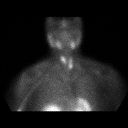

[Series 1: spect - (id)_(id)_tra · 8.3mm · 8.28mm/px · 6 of 64 frames shown]
[frame 6/64]
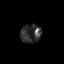
[frame 16/64]
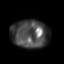
[frame 27/64]
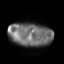
[frame 38/64]
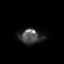
[frame 48/64]
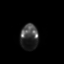
[frame 59/64]
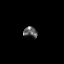

[Series 1: spect - (id)_(id)_cor · 8.3mm · 8.28mm/px · 6 of 64 frames shown]
[frame 6/64]
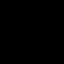
[frame 16/64]
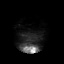
[frame 27/64]
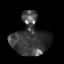
[frame 38/64]
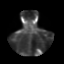
[frame 48/64]
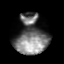
[frame 59/64]
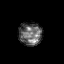

[Series 2: 2 hr ant · 4.14mm/px · 1 of 1 slices shown]
[im 1/1]
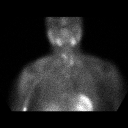

[Series 3: spect parathyroid · 8.28mm/px · 6 of 64 frames shown]
[frame 6/64]
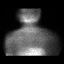
[frame 16/64]
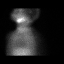
[frame 27/64]
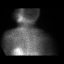
[frame 38/64]
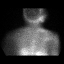
[frame 48/64]
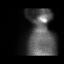
[frame 59/64]
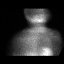

[20 of 20 positions shown; findings below may reference images not displayed]

FINDINGS: Planar imaging: Normal initial uptake of sestamibi within the
thyroid lobes. Asymmetry of the thyroid lobes, with a absent tracer
localization at the inferior pole of the RIGHT lobe. On delayed
image, asymmetric tracer retention is seen at the superior pole the
RIGHT lobe at the inferior pole the LEFT lobe. No ectopic
localization of sestamibi within the mediastinum.

SPECT imaging: Focal abnormal sestamibi retention at the inferior
pole of the LEFT thyroid lobe question LEFT inferior parathyroid
adenoma. Significant retention of sestamibi at the upper pole region
of the RIGHT thyroid lobe is also seen, unable to exclude concurrent
RIGHT upper parathyroid adenoma though this uptake appears somewhat
more anterior in position than expected. No abnormal sestamibi
localization within the mediastinum or at the positions of the
remaining parathyroid glands.
IMPRESSION: Abnormal parathyroid scan showing abnormal sestamibi retention at
the LEFT inferior parathyroid gland region question LEFT inferior
parathyroid adenoma.

Additional abnormal sestamibi retention at the superior pole of the
RIGHT thyroid lobe, fairly anterior in position, potentially an
additional RIGHT upper parathyroid adenoma versus abnormal retention
of tracer within a thyroid mass; recommend dedicated thyroid
ultrasound to assess.

## 2018-12-24 ENCOUNTER — Ambulatory Visit: Payer: Medicare Other | Admitting: Internal Medicine

## 2019-01-21 ENCOUNTER — Other Ambulatory Visit: Payer: Self-pay

## 2019-01-21 ENCOUNTER — Ambulatory Visit
Admission: RE | Admit: 2019-01-21 | Discharge: 2019-01-21 | Disposition: A | Discharge status: Home / Self Care | Payer: Medicare Other | Source: Ambulatory Visit | Attending: Family Medicine | Admitting: Family Medicine

## 2019-01-21 DIAGNOSIS — Z1231 Encounter for screening mammogram for malignant neoplasm of breast: Secondary | ICD-10-CM

## 2019-02-04 ENCOUNTER — Telehealth: Payer: Self-pay | Admitting: Family Medicine

## 2019-02-04 DIAGNOSIS — I10 Essential (primary) hypertension: Secondary | ICD-10-CM

## 2019-02-04 NOTE — Telephone Encounter (Signed)
Pharmacy sent refill request for lisinopril 20 mg qty 30 refills for 90 days please send to [place], [place] - [address]

## 2019-02-07 MED ORDER — LISINOPRIL 20 MG PO TABS: 20.0000 mg | ORAL_TABLET | Freq: Every day | ORAL | 2 refills | Status: DC

## 2019-02-07 NOTE — Telephone Encounter (Signed)
done

## 2019-02-16 ENCOUNTER — Telehealth: Payer: Self-pay

## 2019-02-16 DIAGNOSIS — I10 Essential (primary) hypertension: Secondary | ICD-10-CM

## 2019-02-16 MED ORDER — LISINOPRIL 20 MG PO TABS: 20.0000 mg | ORAL_TABLET | Freq: Every day | ORAL | 0 refills | Status: DC

## 2019-02-16 NOTE — Telephone Encounter (Signed)
[facility] has sent a request for Lisinopril to be sent again for 90 day supply. Change being made.

## 2019-02-24 ENCOUNTER — Other Ambulatory Visit: Payer: Self-pay

## 2019-03-01 ENCOUNTER — Other Ambulatory Visit: Payer: Self-pay

## 2019-03-01 ENCOUNTER — Encounter: Payer: Self-pay | Admitting: Internal Medicine

## 2019-03-01 ENCOUNTER — Ambulatory Visit: Payer: Medicare Other | Admitting: Internal Medicine

## 2019-03-01 VITALS — BP 136/90 | HR 84 | Ht 67.5 in | Wt 186.0 lb

## 2019-03-01 DIAGNOSIS — M899 Disorder of bone, unspecified: Secondary | ICD-10-CM

## 2019-03-01 DIAGNOSIS — E890 Postprocedural hypothyroidism: Secondary | ICD-10-CM

## 2019-03-01 DIAGNOSIS — Z9889 Other specified postprocedural states: Secondary | ICD-10-CM

## 2019-03-01 DIAGNOSIS — M8580 Other specified disorders of bone density and structure, unspecified site: Secondary | ICD-10-CM

## 2019-03-01 DIAGNOSIS — E559 Vitamin D deficiency, unspecified: Secondary | ICD-10-CM

## 2019-03-01 DIAGNOSIS — E210 Primary hyperparathyroidism: Secondary | ICD-10-CM | POA: Diagnosis not present

## 2019-03-01 DIAGNOSIS — E041 Nontoxic single thyroid nodule: Secondary | ICD-10-CM | POA: Diagnosis not present

## 2019-03-01 DIAGNOSIS — M949 Disorder of cartilage, unspecified: Secondary | ICD-10-CM

## 2019-03-01 LAB — VITAMIN D 25 HYDROXY (VIT D DEFICIENCY, FRACTURES): VITD: 37.23 ng/mL (ref 30.00–100.00)

## 2019-03-01 LAB — T3, FREE: T3, Free: 2.6 pg/mL (ref 2.3–4.2)

## 2019-03-01 LAB — TSH: TSH: 11.65 u[IU]/mL — ABNORMAL HIGH (ref 0.35–4.50)

## 2019-03-01 LAB — T4, FREE: Free T4: 0.73 ng/dL (ref 0.60–1.60)

## 2019-03-01 NOTE — Progress Notes (Signed)
Patient ID: Tracy Rowe, female   DOB: 11-01-1945, 73 y.o.   MRN: [id number]    HPI  LENNYN Rowe is a 73 y.o. female, returning for follow-up for hypercalcemia/primary hyperparathyroidism, vitamin D deficiency, osteopenia, thyroid nodule. Last visit 6 months ago.  She was diagnosed with hypercalcemia in 09/2016 but she had a technetium scan positive for parathyroid adenoma in 2002.  Reviewed pertinent labs: Lab Results  Component Value Date   PTH 52 12/23/2017   PTH Comment 12/23/2017   PTH 95 (H) 08/14/2017   PTH Comment 08/14/2017   PTH 55 03/02/2017   PTH 142 (H) 11/26/2016   CALCIUM 9.2 10/29/2018   CALCIUM 9.3 05/07/2018   CALCIUM 9.5 05/03/2018   CALCIUM 9.1 12/23/2017   CALCIUM 9.8 10/26/2017   CALCIUM 9.9 09/04/2017   CALCIUM 10.8 (H) 08/24/2017   CALCIUM 10.8 (H) 08/14/2017   CALCIUM 10.9 (H) 05/22/2017   CALCIUM 11.0 (H) 03/02/2017   She had an indeterminate Tc sestamibi scan (2002): NO DEFINITE PARATHYROID ADENOPATHY LOCALIZED IN THE NECK OR CHEST ALTHOUGH THERE IS A FOCUS OF MILDLY PROMINENT ACTIVITY AT THE INFERIOR POLE OF THE LEFT LOBE OF THE THYROID WHICH WOULD BE THE MOST LIKELY SITE ON THIS SCAN.  Other labs: Component     Latest Ref Rng & Units 03/02/2017  Vitamin D 1, 25 (OH) Total     18 - 72 pg/mL 68  Vitamin D3 1, 25 (OH)     pg/mL 44  Vitamin D2 1, 25 (OH)     pg/mL 24  Phosphorus     2.3 - 4.6 mg/dL 2.4  Magnesium     1.5 - 2.5 mg/dL 1.8  Normal calcitriol, phosphorus, magnesium.  Component     Latest Ref Rng & Units 03/04/2017  Creatinine, Urine     20 - 320 mg/dL 63  Creatinine, 24H Ur     0.63 - 2.50 g/24 h 0.50 (L)  Calcium, Ur     Not estab mg/dL 3  Calcium, 24 hour urine     35 - 250 mg/24 h 24 (L)  Urinary calcium is low, but the creatinine was also low.  I did not ask her to repeat a collection, since even a higher creatinine level will most likely not push her over the limit for hypercalciuria.    Due to persistent  hypercalcemia with nonsuppressed PTH and her osteoporosis, I referred her to Dr. Harlow Asa for a second opinion if she needs parathyroid surgery.  We checked the following tests:  Technetium sestamibi parathyroid scan (05/05/2017): Left inferior parathyroid adenoma and a possible right superior parathyroid adenoma versus thyroid nodule.   Thyroid ultrasound (05/20/2017): Several nodules, of which one right superior measuring 1.6 x 1.3 x 1.3, solid, hypoechoic; another left inferior 1.9 x 1.3 x 1.3 cm, mixed, hypoechoic, presumed to be an enlarged parathyroid gland  FNA of the 1.6 cm nodule (07/08/2017): Scant epithelium (Bethesda category 1)  She had parathyroidectomy + right lobectomy on 09/03/2017.  Left Inferior Parathyroid Adenoma was resected.  The right superior parathyroid did not contain an adenoma.  Right thyroid nodule was benign.  Diagnosis 1. Parathyroid gland, left inferior - HYPERCELLULAR PARATHYROID TISSUE CONSISTENT WITH ADENOMA 2. Parathyroid gland, right superior - PARATHYROID TISSUE 3. Thyroid, lobectomy, right lobe - BENIGN FOLLICULAR ADENOMA (1.2 CM) The right thyroid lobe has an adenomatous lesion which has a very thin fibrous capsule consistent with follicular adenoma. There is no evidence of capsular or vascular invasion. The surrounding thyroid parenchyma  is unremarkable.  Calcium normalized after the surgery.  Reviewed patient's DXA scan reports: Date L1-4 T score FN T score  11/19/2016 L2, L3:-2.4 RFN: -2.3 LFN: -2.3  08/09/2007 L1, L3, L4: -2.0 RFN: n/a LFN: -1.8  Per review of the chart, she was approved for Prolia.  No falls or fractures.  No history of kidney stones.  + Mild CKD. Last BUN/Cr: Lab Results  Component Value Date   BUN 11 10/29/2018   BUN 18 05/07/2018   BUN 14 05/03/2018   BUN 11 10/26/2017   BUN 22 (H) 09/04/2017   BUN 13 08/24/2017   BUN 12 05/22/2017   BUN 13 03/02/2017   BUN 22 11/20/2016   BUN 14 09/25/2016   Lab Results   Component Value Date   CREATININE 1.16 (H) 10/29/2018   CREATININE 1.33 (H) 05/07/2018   CREATININE 1.28 (H) 05/03/2018   CREATININE 1.14 (H) 10/26/2017   CREATININE 1.58 (H) 09/04/2017   CREATININE 1.03 (H) 08/24/2017   CREATININE 1.05 (H) 05/22/2017   CREATININE 1.16 (H) 03/02/2017   CREATININE 1.37 (H) 11/20/2016   CREATININE 1.09 (H) 09/25/2016   She was on HCTZ in the past but we changed to Lasix during the investigation for hyperparathyroidism.  She has a history of vitamin D deficiency and she was on ergocalciferol in the past.  Currently on 2000 units vitamin D3 daily.  On this dose, vitamin D level is normal per last check: Lab Results  Component Value Date   VD25OH 39.38 12/23/2017   VD25OH 38.66 08/14/2017   VD25OH 26.51 (L) 05/15/2017   VD25OH 31.30 03/02/2017   VD25OH 30 12/29/2016   VD25OH 10 (L) 10/23/2016   Pt does not have a FH of osteoporosis.  Pt. also has a history of HTN.  Her TFTs were transiently slightly elevated after surgery, but normalized at last check: Lab Results  Component Value Date   TSH 4.42 08/31/2018   TSH 5.00 (H) 05/31/2018   TSH 5.630 (H) 05/03/2018   TSH 4.48 12/23/2017   TSH 3.930 10/26/2017   TSH 2.70 11/26/2016   FREET4 0.89 08/31/2018   FREET4 0.81 05/31/2018   FREET4 1.18 05/03/2018   FREET4 0.79 12/23/2017   T3FREE 2.6 08/31/2018   T3FREE 2.5 05/31/2018   T3FREE 3.2 12/23/2017    ROS: Constitutional: no weight gain/no weight loss, no fatigue, + subjective hyperthermia (hot flushes and night sweats), no subjective hypothermia Eyes: no blurry vision, no xerophthalmia ENT: no sore throat, no nodules palpated in neck, no dysphagia, no odynophagia, no hoarseness Cardiovascular: no CP/no SOB/no palpitations/no leg swelling Respiratory: no cough/no SOB/no wheezing Gastrointestinal: no N/no V/no D/no C/no acid reflux Musculoskeletal: no muscle aches/no joint aches Skin: no rashes, no hair loss Neurological: no tremors/no  numbness/no tingling/no dizziness  I reviewed pt's medications, allergies, PMH, social hx, family hx, and changes were documented in the history of present illness. Otherwise, unchanged from my initial visit note.  Past Medical History:  Diagnosis Date  . HTN (hypertension)   . Hyperlipidemia   . Osteoporosis    Past Surgical History:  Procedure Laterality Date  . ABDOMINAL HYSTERECTOMY    . PARATHYROIDECTOMY N/A 09/03/2017   Procedure: NECK EXPLORATION WITH LEFT INFERIOR PARATHYROIDECTOMY AND RIGHT SUPERIOR PARATHYROIDECTOMY;  Surgeon: Armandina Gemma, MD;  Location: [facility];  Service: General;  Laterality: N/A;  . THYROID LOBECTOMY Right 09/03/2017   Procedure: RIGHT THYROID LOBECTOMY;  Surgeon: Armandina Gemma, MD;  Location: [facility];  Service: General;  Laterality: Right;  Social History   Socioeconomic History  . Marital status: Married    Spouse name: Not on file  . Number of children: Not on file  . Years of education: Not on file  . Highest education level: Not on file  Occupational History  . Not on file  Social Needs  . Financial resource strain: Not on file  . Food insecurity    Worry: Not on file    Inability: Not on file  . Transportation needs    Medical: Not on file    Non-medical: Not on file  Tobacco Use  . Smoking status: Never Smoker  . Smokeless tobacco: Never Used  Substance and Sexual Activity  . Alcohol use: No  . Drug use: No  . Sexual activity: Never  Lifestyle  . Physical activity    Days per week: Not on file    Minutes per session: Not on file  . Stress: Not on file  Relationships  . Social Herbalist on phone: Not on file    Gets together: Not on file    Attends religious service: Not on file    Active member of club or organization: Not on file    Attends meetings of clubs or organizations: Not on file    Relationship status: Not on file  . Intimate partner violence    Fear of current or ex partner: Not on file    Emotionally  abused: Not on file    Physically abused: Not on file    Forced sexual activity: Not on file  Other Topics Concern  . Not on file  Social History Narrative  . Not on file   Current Outpatient Medications on File Prior to Visit  Medication Sig Dispense Refill  . atorvastatin (LIPITOR) 20 MG tablet Take 1 tablet (20 mg total) by mouth daily. 90 tablet 1  . Chlorphen-PE-Acetaminophen (NOREL AD) 4-10-325 MG TABS Take 1 tablet by mouth every 6 (six) hours as needed. 12 tablet 0  . fluticasone (FLONASE) 50 MCG/ACT nasal spray Place 2 sprays into both nostrils daily. 16 g 1  . furosemide (LASIX) 20 MG tablet Take 1 tablet (20 mg total) by mouth daily. 90 tablet 3  . lisinopril (ZESTRIL) 20 MG tablet Take 1 tablet (20 mg total) by mouth daily. 90 tablet 0  . polyethylene glycol powder (GLYCOLAX/MIRALAX) powder Take 17 g by mouth 2 (two) times daily as needed. (Patient taking differently: Take 17 g by mouth 2 (two) times daily as needed for mild constipation. ) 3350 g 1  . Vitamin D, Cholecalciferol, 1000 units CAPS Take 2,000 Units by mouth daily.      No current facility-administered medications on file prior to visit.    No Known Allergies Family History  Problem Relation Age of Onset  . Diabetes Sister   . Hypertension Sister   . Hypertension Brother   . Heart disease Brother    PE: BP 136/90   Pulse 84   Ht 5' 7.5" (1.715 m) Comment: measured  Wt 186 lb (84.4 kg)   SpO2 98%   BMI 28.70 kg/m  Wt Readings from Last 3 Encounters:  03/01/19 186 lb (84.4 kg)  10/29/18 193 lb 3.2 oz (87.6 kg)  08/31/18 191 lb (86.6 kg)   Constitutional: overweight, in NAD Eyes: PERRLA, EOMI, no exophthalmos ENT: moist mucous membranes, no thyromegaly, no cervical lymphadenopathy Cardiovascular: RRR, No MRG Respiratory: CTA B Gastrointestinal: abdomen soft, NT, ND, BS+ Musculoskeletal: no deformities, strength intact  in all 4 Skin: moist, warm, no rashes Neurological: no tremor with outstretched  hands, DTR normal in all 4  Assessment: 1. Hypercalcemia/Primary hyperparathyroidism  2.  Osteopenia  3. Vit D def  4.  Thyroid nodule  Plan: 1. Patient with history of primary hyperparathyroidism with a previous calcium level being as high as 11.1 and a PTH as high as 142.  The hyperparathyroidism persisted even after normalization of her vitamin D.  Of note, she does have CKD which can raise the PTH, but not severe enough to cause this degree of hyperparathyroidism.  Investigation pointed towards a left inferior and right superior parathyroid adenomas.  Thyroid ultrasound showed a left inferior nodule, most consistent with a parathyroid adenoma and she also had a thyroid nodule that was found incidentally at the time of the surgery.  This was benign.  She had 2 gland parathyroidectomy 1.5 years ago and the pathology showed that only the left inferior gland was adenomatous. -Calcium and PTH levels normalized postop -Most recent calcium level was normal in 10/2018.  We will not repeat this today.  2.  Osteopenia -Reviewed the most recent DXA scan report -We will repeat the scan this year -expect the new T-scores to be slightly better after her parathyroid surgery -We will also make sure that her vitamin D level is normal -Of note, she was approved for Prolia in the past  3. Vit D def -Continue 2000 units vitamin D daily -Latest level was reviewed that this was normal -Recheck level today  4. Thyroid nodule -I reviewed the latest thyroid ultrasound report from 04/2017: Right-sided thyroid nodule, with the largest dimension 1.6 cm.  Biopsy of this nodule was inconclusive due to scant epithelial cells and we discussed about repeating the biopsy but she refused to have another biopsy due to the discomfort of the first one.  She had had prior thyroidectomy at the time of her parathyroid surgery and the thyroid nodule was benign. -We are following her with TFTs to see if she develops  hypothyroidism after her surgery. -Latest TSH level was normal 08/2018.  We will repeat this today  Component     Latest Ref Rng & Units 03/01/2019  TSH     0.35 - 4.50 uIU/mL 11.65 (H)  VITD     30.00 - 100.00 ng/mL 37.23  Triiodothyronine,Free,Serum     2.3 - 4.2 pg/mL 2.6  T4,Free(Direct)     0.60 - 1.60 ng/dL 0.73   Vitamin D level is normal.  However, her TSH is increased and she now requires LT4 >> will start a low dose, 25 mcg daily and recheck in 5 weeks.  We will probably need to increase the dose further at that time.  [name], MD PhD [facility]

## 2019-03-01 NOTE — Patient Instructions (Signed)
Please stop at the lab.  Please continue vitamin D 2000 units daily.  Call me in 1 week if you do not hear from [facility] about the Bone Density scan.  Please come back for a follow-up appointment in 1 year.

## 2019-03-02 MED ORDER — LEVOTHYROXINE SODIUM 25 MCG PO TABS: 25.0000 ug | ORAL_TABLET | Freq: Every day | ORAL | 5 refills | Status: DC

## 2019-03-04 ENCOUNTER — Telehealth: Payer: Self-pay

## 2019-03-04 NOTE — Telephone Encounter (Signed)
-----   Message from [name], MD sent at 03/02/2019 12:59 PM EDT ----- Lenna Sciara, can you please call pt: Tracy Rowe thyroid tests are now normal so we will need to start levothyroxine, low dose.  I added 25 mcg to Tracy Rowe list but did not send it to the pharmacy yet (no print) until you can talk to Tracy Rowe and let Tracy Rowe know about this.  Please ask Tracy Rowe to take it every day, with water, at least 30 minutes before breakfast, separated by at least 4 hours from: - acid reflux medications - calcium - iron - multivitamins She needs to come back for labs in 5 weeks.  Labs are in.

## 2019-03-07 MED ORDER — LEVOTHYROXINE SODIUM 25 MCG PO TABS: 25.0000 ug | ORAL_TABLET | Freq: Every day | ORAL | 5 refills | Status: DC

## 2019-03-07 NOTE — Telephone Encounter (Signed)
Notified patient of message from Dr. Gherghe, patient expressed understanding and agreement. No further questions.  RX sent. 

## 2019-04-13 IMAGING — DX DG CHEST 2V
2 series · 2 of 2 positions shown · non-contrast
Comparison: None.

CLINICAL DATA: Preoperative thyroidectomy.  Hypertension

EXAM:
CHEST  2 VIEW

[chest pa]
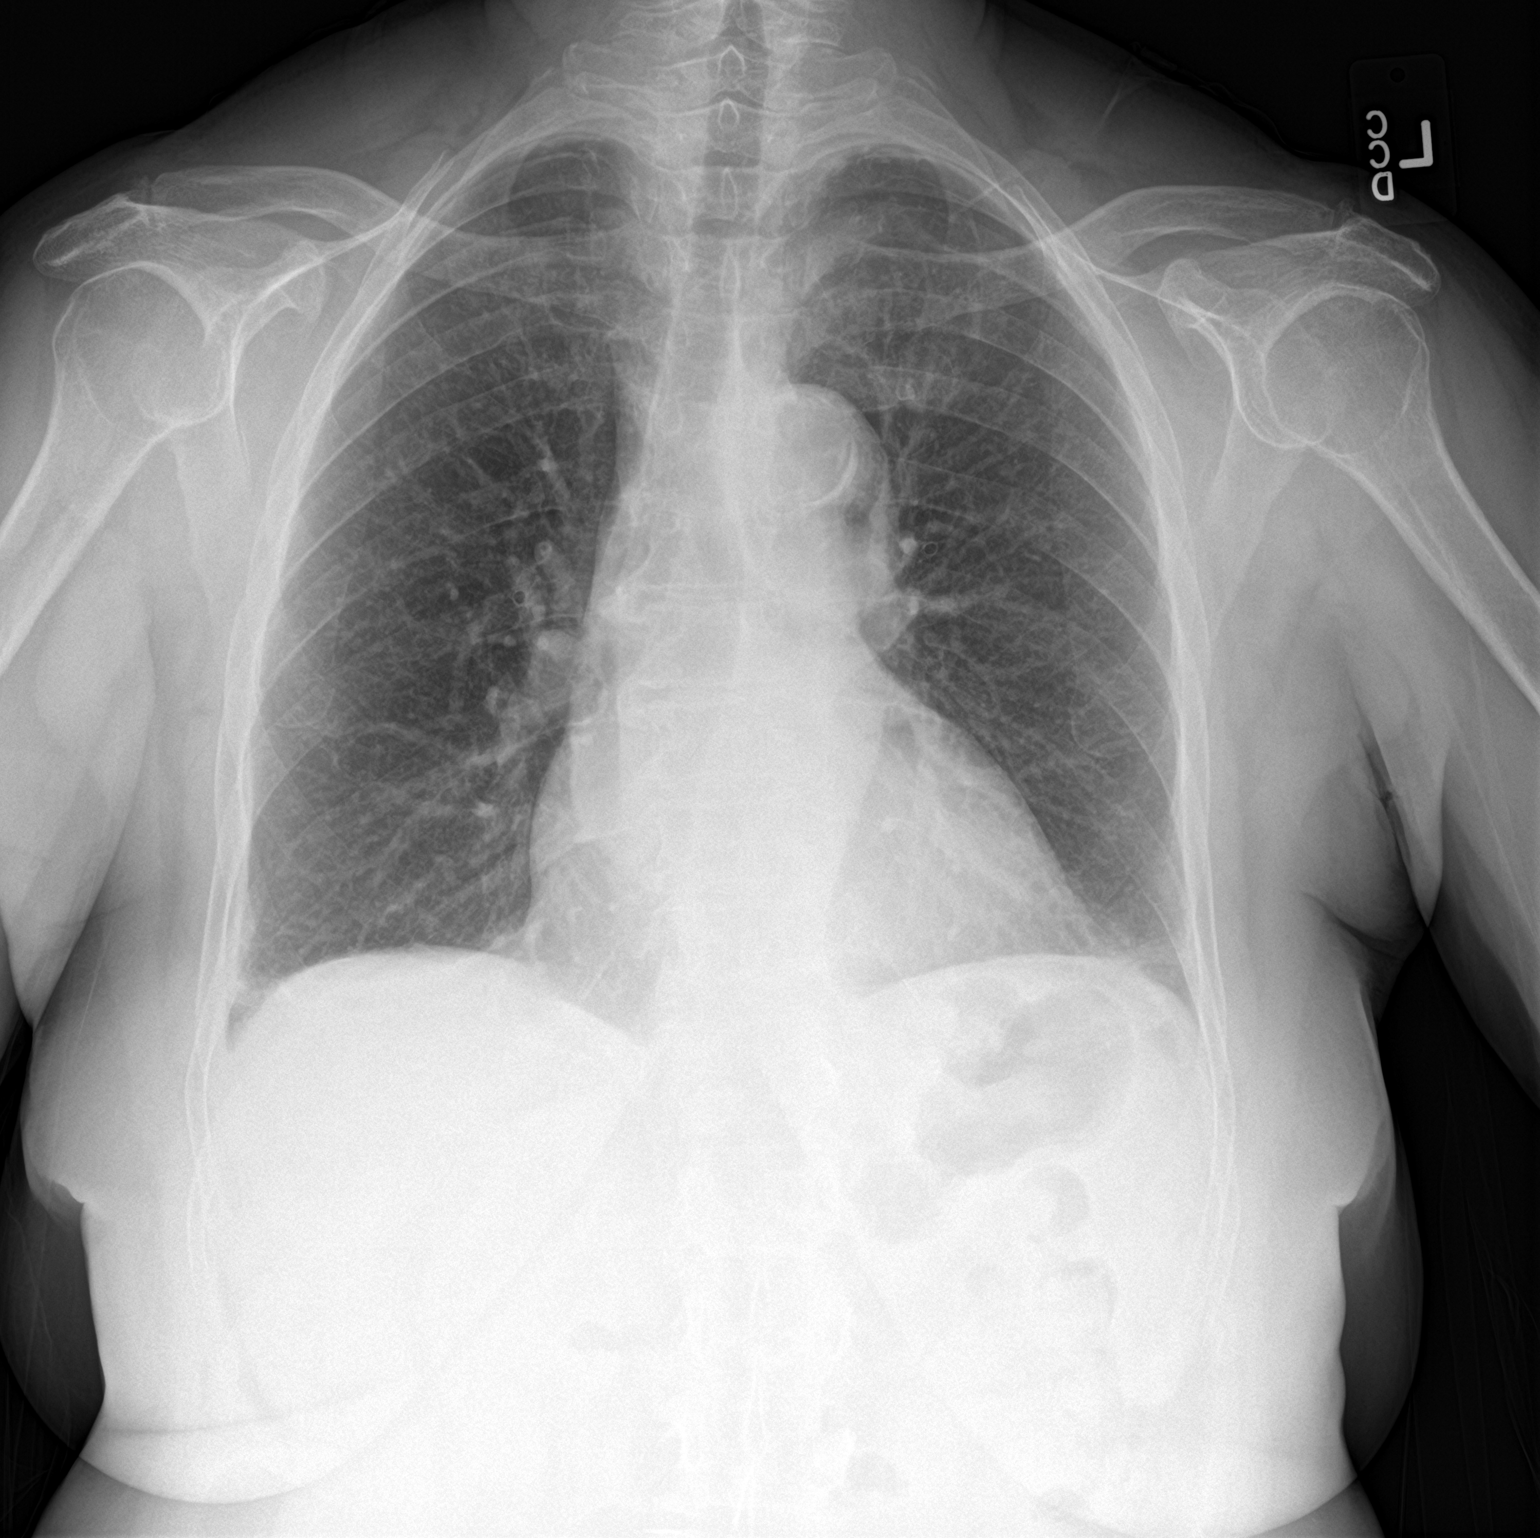

[chest lat]
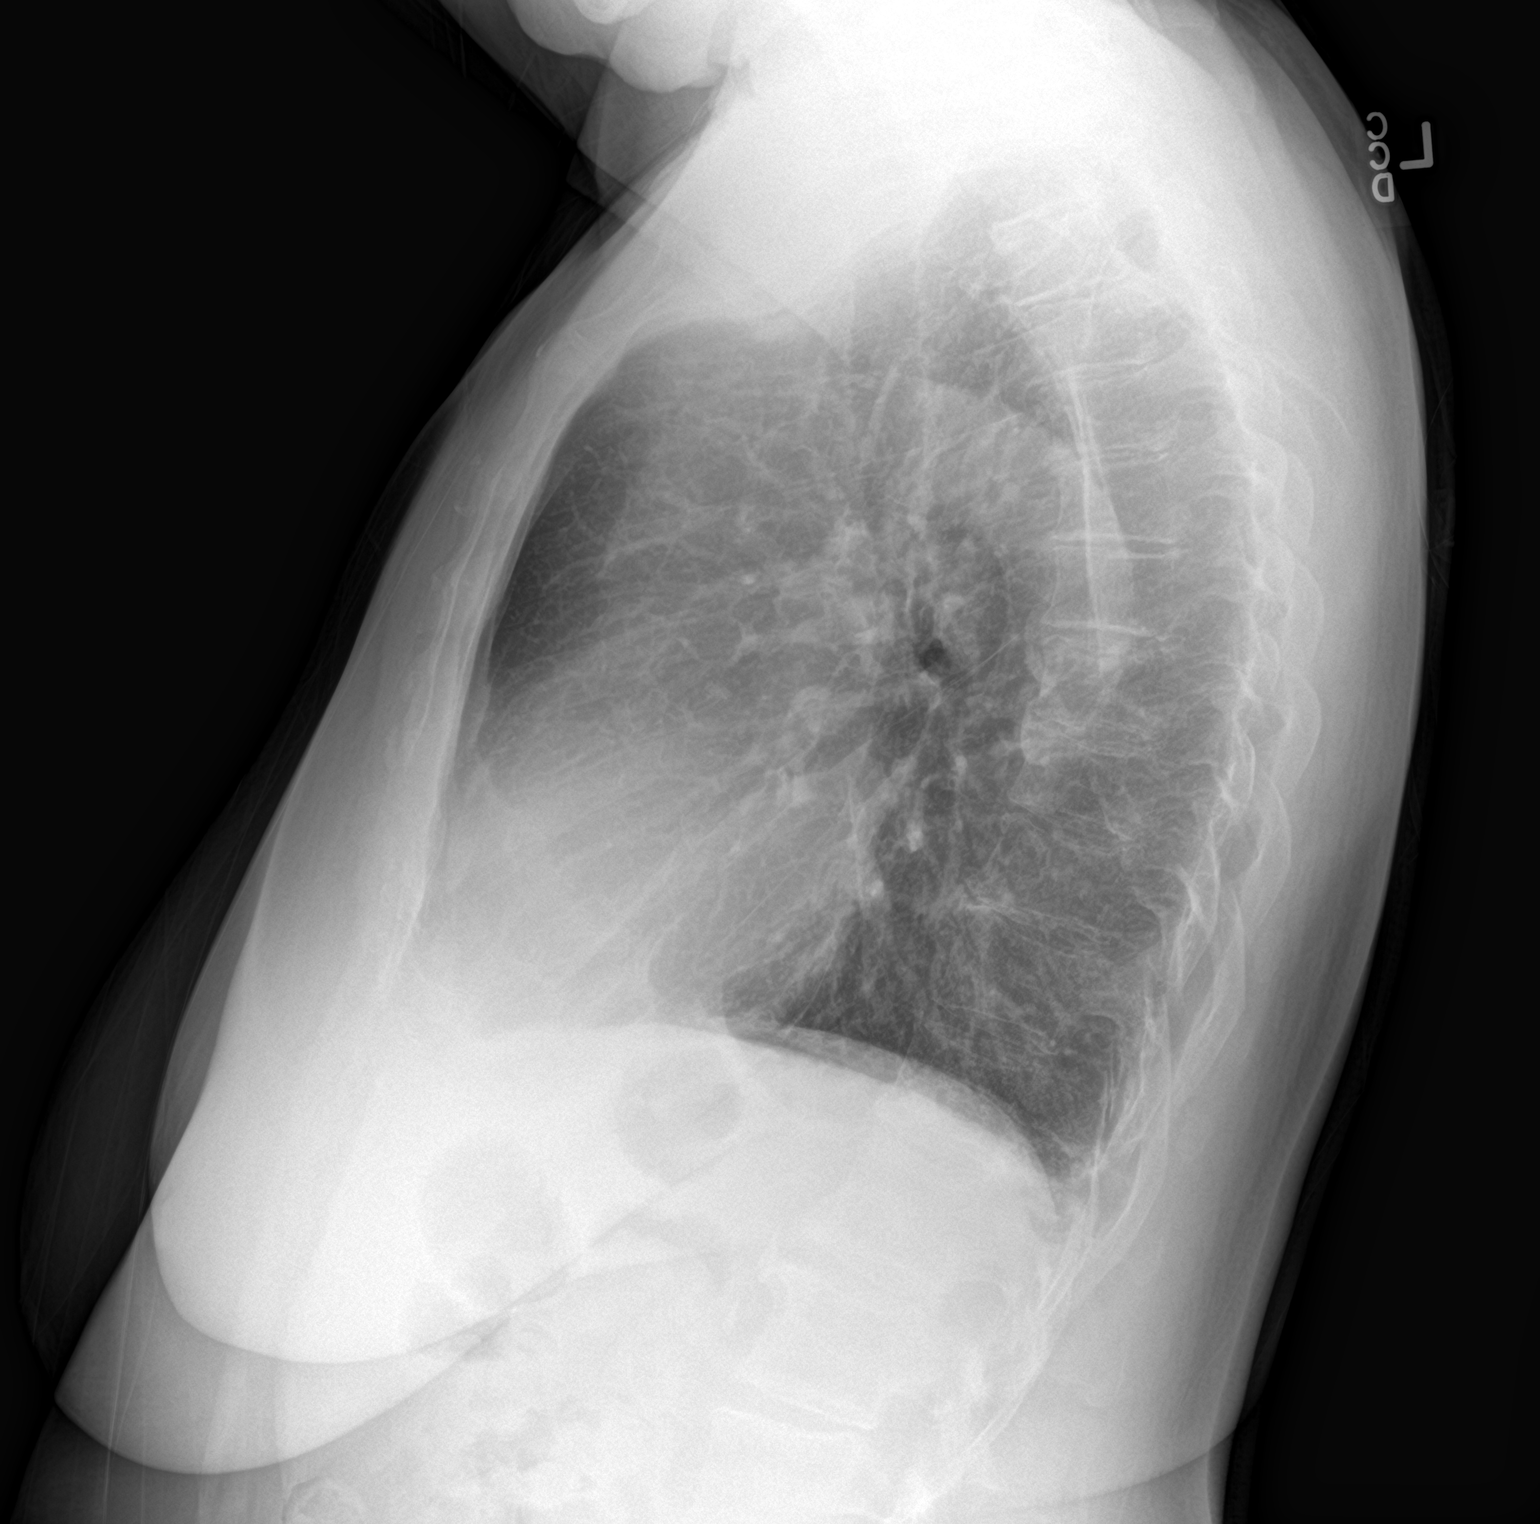

[2 of 2 positions shown; findings below may reference images not displayed]

FINDINGS: No edema or consolidation. Heart size and pulmonary vascularity are
normal. No adenopathy. There is aortic atherosclerosis. Trachea is
midline. No evident bone lesions.
IMPRESSION: No edema or consolidation.  There is aortic atherosclerosis.

Aortic Atherosclerosis ([id number]).

## 2019-05-17 ENCOUNTER — Other Ambulatory Visit: Payer: Self-pay | Admitting: Internal Medicine

## 2019-05-17 NOTE — Telephone Encounter (Signed)
Please advise if you fill this. 

## 2019-05-17 NOTE — Telephone Encounter (Signed)
Okay to refill it.  Further refills per PCP.

## 2019-05-20 ENCOUNTER — Other Ambulatory Visit: Payer: Self-pay | Admitting: Family Medicine

## 2019-05-20 DIAGNOSIS — E782 Mixed hyperlipidemia: Secondary | ICD-10-CM

## 2019-05-25 ENCOUNTER — Other Ambulatory Visit: Payer: Self-pay

## 2019-05-25 ENCOUNTER — Ambulatory Visit
Admission: RE | Admit: 2019-05-25 | Discharge: 2019-05-25 | Disposition: A | Discharge status: Home / Self Care | Payer: Medicare Other | Source: Ambulatory Visit | Attending: Internal Medicine | Admitting: Internal Medicine

## 2019-05-25 DIAGNOSIS — M8580 Other specified disorders of bone density and structure, unspecified site: Secondary | ICD-10-CM

## 2019-05-25 DIAGNOSIS — M949 Disorder of cartilage, unspecified: Secondary | ICD-10-CM

## 2019-05-25 DIAGNOSIS — M899 Disorder of bone, unspecified: Secondary | ICD-10-CM

## 2019-05-25 DIAGNOSIS — E210 Primary hyperparathyroidism: Secondary | ICD-10-CM

## 2019-06-01 ENCOUNTER — Telehealth: Payer: Self-pay

## 2019-06-01 NOTE — Telephone Encounter (Signed)
Left message for patient to return our call at [phone].  

## 2019-06-01 NOTE — Telephone Encounter (Signed)
-----   Message from [name], MD sent at 05/25/2019  4:10 PM EDT ----- Tracy Rowe, can you please call pt: I received the results of her most recent bone density scan and it appears that her bone density improved at all sites, which is excellent news!

## 2019-06-28 ENCOUNTER — Encounter: Payer: Self-pay | Admitting: Family Medicine

## 2019-06-28 DIAGNOSIS — I700 Atherosclerosis of aorta: Secondary | ICD-10-CM

## 2019-06-28 DIAGNOSIS — I739 Peripheral vascular disease, unspecified: Secondary | ICD-10-CM | POA: Insufficient documentation

## 2019-06-28 HISTORY — DX: Atherosclerosis of aorta: I70.0

## 2019-06-29 ENCOUNTER — Other Ambulatory Visit: Payer: Self-pay

## 2019-06-29 ENCOUNTER — Ambulatory Visit: Payer: Medicare Other | Admitting: Family Medicine

## 2019-06-29 ENCOUNTER — Encounter: Payer: Self-pay | Admitting: Family Medicine

## 2019-06-29 VITALS — BP 120/82 | HR 72 | Wt 187.8 lb

## 2019-06-29 DIAGNOSIS — I739 Peripheral vascular disease, unspecified: Secondary | ICD-10-CM

## 2019-06-29 DIAGNOSIS — I700 Atherosclerosis of aorta: Secondary | ICD-10-CM | POA: Diagnosis not present

## 2019-06-29 DIAGNOSIS — R7989 Other specified abnormal findings of blood chemistry: Secondary | ICD-10-CM

## 2019-06-29 DIAGNOSIS — I10 Essential (primary) hypertension: Secondary | ICD-10-CM | POA: Diagnosis not present

## 2019-06-29 DIAGNOSIS — E782 Mixed hyperlipidemia: Secondary | ICD-10-CM | POA: Diagnosis not present

## 2019-06-29 NOTE — Progress Notes (Signed)
Subjective:    Patient ID: Tracy Rowe, female    DOB: 1945/11/24, 73 y.o.   MRN: [id number]  HPI Chief Complaint  Patient presents with  . follow-up    follow-up on htn   She is here to follow up on HTN, HL, CKD, aortic atherosclerosis and PAD.   HTN- takes lisinopril 20 mg and Lasix 20 mg daily. Does not check BP at home.   HL- reports taking statin daily without any side effects.   Aortic atherosclerosis - found on XR  PAD - hx of abnormal ABIs but denies ever having symptoms such as pain, claudication, ulcers or sores  Denies fever, chills, dizziness, chest pain, palpitations, shortness of breath, abdominal pain, N/V/D, urinary symptoms, LE edema.   Reviewed allergies, medications, past medical, surgical, family, and social history.   Review of Systems Pertinent positives and negatives in the history of present illness.     Objective:   Physical Exam Constitutional:      General: She is not in acute distress.    Appearance: Normal appearance. She is not ill-appearing.  Cardiovascular:     Rate and Rhythm: Normal rate and regular rhythm.     Pulses: Decreased pulses.          Dorsalis pedis pulses are 1+ on the right side and 2+ on the left side.       Posterior tibial pulses are 0 on the right side and 0 on the left side.     Heart sounds: Normal heart sounds.  Pulmonary:     Effort: Pulmonary effort is normal.     Breath sounds: Normal breath sounds.  Musculoskeletal:     Right lower leg: No edema.     Left lower leg: No edema.  Feet:     Right foot:     Skin integrity: Skin integrity normal.     Left foot:     Skin integrity: Skin integrity normal.     Comments: Normal skin appearance and warmth.  Normal sensation to monofilament  Skin:    General: Skin is warm and dry.     Capillary Refill: Capillary refill takes less than 2 seconds.  Neurological:     Mental Status: She is alert and oriented to person, place, and time.     Cranial Nerves:  Cranial nerves are intact.     Sensory: Sensation is intact.     Motor: Motor function is intact.     Gait: Gait is intact.    BP 120/82   Pulse 72   Wt 187 lb 12.8 oz (85.2 kg)   BMI 28.98 kg/m       Assessment & Plan:  Essential hypertension - Plan: CBC with Differential, Comprehensive metabolic panel -BP well controlled. Continue with current regimen. Check labs and follow up   PAD (peripheral artery disease) ([place]) - Plan: VAS [place] ABI WITH/WO TBI, VAS [place] LOWER EXTREMITY ARTERIAL DUPLEX -hx of abnormal ABIs. Recheck. Asymptomatic. Discussed risk factors for having atherosclerosis in other places and she dose have aortic atherosclerosis. Discussed reducing risk factors for worsening health including good BP control, lipid control. She does not smoke. Consider referral to vascular pending results.   Mixed hyperlipidemia - Plan: Lipid Panel -continue statin therapy. Follow up pending results   Aortic atherosclerosis ([place]) -discussed the increased risk of heart disease and stroke. Continue statin and reduce risk factors  Elevated serum creatinine - Plan: Comprehensive metabolic panel -continue to monitor and keep BP under good control.

## 2019-06-30 ENCOUNTER — Other Ambulatory Visit: Payer: Self-pay | Admitting: Internal Medicine

## 2019-06-30 LAB — LIPID PANEL
Chol/HDL Ratio: 3.8 ratio (ref 0.0–4.4)
Cholesterol, Total: 168 mg/dL (ref 100–199)
HDL: 44 mg/dL (ref >39)
LDL Chol Calc (NIH): 103 mg/dL — ABNORMAL HIGH (ref 0–99)
Triglycerides: 116 mg/dL (ref 0–149)
VLDL Cholesterol Cal: 21 mg/dL (ref 5–40)

## 2019-06-30 LAB — COMPREHENSIVE METABOLIC PANEL
ALT: 11 IU/L (ref 0–32)
AST: 19 IU/L (ref 0–40)
Albumin/Globulin Ratio: 1.8 (ref 1.2–2.2)
Albumin: 4.6 g/dL (ref 3.7–4.7)
Alkaline Phosphatase: 69 IU/L (ref 39–117)
BUN/Creatinine Ratio: 11 — ABNORMAL LOW (ref 12–28)
BUN: 14 mg/dL (ref 8–27)
Bilirubin Total: 0.7 mg/dL (ref 0.0–1.2)
CO2: 25 mmol/L (ref 20–29)
Calcium: 9.4 mg/dL (ref 8.7–10.3)
Chloride: 105 mmol/L (ref 96–106)
Creatinine, Ser: 1.23 mg/dL — ABNORMAL HIGH (ref 0.57–1.00)
GFR calc Af Amer: 50 mL/min/{1.73_m2} — ABNORMAL LOW (ref >59)
GFR calc non Af Amer: 43 mL/min/{1.73_m2} — ABNORMAL LOW (ref >59)
Globulin, Total: 2.6 g/dL (ref 1.5–4.5)
Glucose: 92 mg/dL (ref 65–99)
Potassium: 4.1 mmol/L (ref 3.5–5.2)
Sodium: 144 mmol/L (ref 134–144)
Total Protein: 7.2 g/dL (ref 6.0–8.5)

## 2019-06-30 LAB — CBC WITH DIFFERENTIAL/PLATELET
Basophils Absolute: 0.1 10*3/uL (ref 0.0–0.2)
Basos: 1 %
EOS (ABSOLUTE): 0.2 10*3/uL (ref 0.0–0.4)
Eos: 3 %
Hematocrit: 41 % (ref 34.0–46.6)
Hemoglobin: 13.5 g/dL (ref 11.1–15.9)
Immature Grans (Abs): 0 10*3/uL (ref 0.0–0.1)
Immature Granulocytes: 0 %
Lymphocytes Absolute: 1.4 10*3/uL (ref 0.7–3.1)
Lymphs: 23 %
MCH: 27.1 pg (ref 26.6–33.0)
MCHC: 32.9 g/dL (ref 31.5–35.7)
MCV: 82 fL (ref 79–97)
Monocytes Absolute: 0.4 10*3/uL (ref 0.1–0.9)
Monocytes: 7 %
Neutrophils Absolute: 4 10*3/uL (ref 1.4–7.0)
Neutrophils: 66 %
Platelets: 187 10*3/uL (ref 150–450)
RBC: 4.98 x10E6/uL (ref 3.77–5.28)
RDW: 13.2 % (ref 11.7–15.4)
WBC: 6.1 10*3/uL (ref 3.4–10.8)

## 2019-06-30 MED ORDER — ATORVASTATIN CALCIUM 40 MG PO TABS: 40.0000 mg | ORAL_TABLET | Freq: Every day | ORAL | 1 refills | Status: DC

## 2019-07-01 ENCOUNTER — Other Ambulatory Visit: Payer: Self-pay

## 2019-07-01 ENCOUNTER — Ambulatory Visit (HOSPITAL_COMMUNITY)
Admission: RE | Admit: 2019-07-01 | Discharge: 2019-07-01 | Disposition: A | Discharge status: Home / Self Care | Payer: Medicare Other | Source: Ambulatory Visit | Attending: Internal Medicine | Admitting: Internal Medicine

## 2019-07-01 DIAGNOSIS — I739 Peripheral vascular disease, unspecified: Secondary | ICD-10-CM | POA: Diagnosis not present

## 2019-07-04 ENCOUNTER — Encounter: Payer: Self-pay | Admitting: Family Medicine

## 2019-08-16 ENCOUNTER — Other Ambulatory Visit: Payer: Self-pay | Admitting: Family Medicine

## 2019-08-16 DIAGNOSIS — I10 Essential (primary) hypertension: Secondary | ICD-10-CM

## 2019-08-23 ENCOUNTER — Other Ambulatory Visit: Payer: Self-pay | Admitting: Internal Medicine

## 2019-08-23 NOTE — Telephone Encounter (Signed)
Please advise on refill.

## 2019-08-23 NOTE — Telephone Encounter (Signed)
Per PCP 

## 2019-10-30 NOTE — Progress Notes (Signed)
Tracy Rowe is a 74 y.o. female who presents for annual wellness visit and follow-up on chronic medical conditions.  She has the following concerns:  Denies any concerns today. 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   Chronic health conditions include:  HTN- taking lisinopril and Lasix daily. No concerns.  Does not regularly check her blood pressure at home.  HL- taking atorvastatin daily and no side effects   Aortic atherosclerosis- on statin   Hyperparathyroidism - Dr. Cruzita Rowe started her on levothyroxine but she stopped after the 1 month and states it made her "sick". Made her feel "out of it and sweating was worse".  She did not let Dr. Cruzita Rowe know about this.  Normal ABIs and no sign of PAD in 06/2019   Immunization History  Administered Date(s) Administered  . Influenza, High Dose Seasonal PF 10/23/2016, 05/22/2017, 06/14/2018, 05/18/2019  . Influenza-Unspecified 05/18/2019  . PFIZER SARS-COV-2 Vaccination 10/22/2019  . Pneumococcal Conjugate-13 10/23/2016  . Pneumococcal Polysaccharide-23 10/29/2018  . Tdap 10/30/2016   Last Pap smear: hysterectomy and declines pelvic exam  Last mammogram:12/2018 Last colonoscopy: Cologuard in 2018. Due again May 2021 Last DEXA: 05/2019 Dentist: upper dentures. Has been years since she saw a dentist  Ophtho: 2017. [employer] has prescription glasses  Exercise: very little  Other doctors caring for patient include: Dr. Cruzita Rowe- Endocrinologist    Depression screen:  See questionnaire below.  Depression screen [facility] 2/9 10/31/2019 06/29/2019 10/29/2018 10/26/2017 05/22/2017  Decreased Interest 0 0 0 0 0  Down, Depressed, Hopeless 0 0 0 0 0  PHQ - 2 Score 0 0 0 0 0    Fall Risk Screen: see questionnaire below. Fall Risk  10/31/2019 06/29/2019 10/29/2018 10/26/2017 05/22/2017  Falls in the past year? 0 0 0 No No  Number falls in past yr: 0 0 0 - -  Injury with Fall? 0 0 0 - -    ADL screen:  See questionnaire below Functional Status  Survey: Is the patient deaf or have difficulty hearing?: No Does the patient have difficulty seeing, even when wearing glasses/contacts?: No Does the patient have difficulty concentrating, remembering, or making decisions?: No Does the patient have difficulty walking or climbing stairs?: No Does the patient have difficulty dressing or bathing?: No Does the patient have difficulty doing errands alone such as visiting a doctor's office or shopping?: No   End of Life Discussion:  Patient does not have a living will and medical power of attorney.  Her niece Tracy Rowe will be making her decisions for her if she cannot. Her husband Tracy Rowe is unable to do this. Her cell is [phone]  Review of Systems Constitutional: -fever, -chills, -sweats, -unexpected weight change, -anorexia, -fatigue Allergy: -sneezing, -itching, -congestion Dermatology: denies changing moles, rash, lumps, new worrisome lesions ENT: -runny nose, -ear pain, -sore throat, -hoarseness, -sinus pain, -teeth pain, -tinnitus, -hearing loss, -epistaxis Cardiology:  -chest pain, -palpitations, -edema, -orthopnea, -paroxysmal nocturnal dyspnea Respiratory: -cough, -shortness of breath, -dyspnea on exertion, -wheezing, -hemoptysis Gastroenterology: -abdominal pain, -nausea, -vomiting, -diarrhea, -constipation, -blood in stool, -changes in bowel movement, -dysphagia Hematology: -bleeding or bruising problems Musculoskeletal: -arthralgias, -myalgias, -joint swelling, -back pain, -neck pain, -cramping, -gait changes Ophthalmology: -vision changes, -eye redness, -itching, -discharge Urology: -dysuria, -difficulty urinating, -hematuria, -urinary frequency, -urgency, incontinence Neurology: -headache, -weakness, -tingling, -numbness, -speech abnormality, -memory loss, -falls, -dizziness Psychology:  -depressed mood, -agitation, -sleep problems    PHYSICAL EXAM:  BP 130/80   Pulse 76   Temp (!) 97.3 F (36.3 C)  Wt 189  lb 3.2 oz (85.8 kg)   BMI 29.20 kg/m   General Appearance: Alert, cooperative, no distress, appears stated age Head: Normocephalic, without obvious abnormality, atraumatic Eyes: PERRL, conjunctiva/corneas clear, EOM's intact Ears: Normal TM's and external ear canals Nose: Mask in place Throat: Mask in place Neck: Supple, no lymphadenopathy; thyroid: no enlargement/tenderness/nodules; no JVD Back: Spine nontender, no curvature, ROM normal, no CVA tenderness Lungs: Clear to auscultation bilaterally without wheezes, rales or ronchi; respirations unlabored Chest Wall: No tenderness or deformity Heart: Regular rate and rhythm, S1 and S2 normal, no murmur, rub or gallop Breast Exam: No tenderness, masses, or nipple discharge or inversion. No axillary lymphadenopathy Abdomen: Soft, non-tender, nondistended, normoactive bowel sounds, no masses, no hepatosplenomegaly Genitalia: Refuses Extremities: No clubbing, cyanosis or edema Pulses: 2+ and symmetric all extremities Skin: Skin color, texture, turgor normal, no rashes or lesions Lymph nodes: Cervical, supraclavicular, and axillary nodes normal Neurologic: CNII-XII intact, normal strength, sensation and gait; reflexes 2+ and symmetric throughout Psych: Normal mood, affect, hygiene and grooming.  ASSESSMENT/PLAN: Medicare annual wellness visit, subsequent -She is here today for Medicare wellness visit.  She denies any issues with depression, falls, memory or difficulty with ADLs.  Advanced directive counseling was done.  MOS T form reviewed and signed.  Packet including HC POA and living will provided  Routine general medical examination at a health care facility - Plan: CBC with Differential/Platelet, Comprehensive metabolic panel, EKG [id number], TSH, Lipid panel, T4, free -Counseling on preventive healthcare and she will be due for her next Cologuard in May.  Mammogram will be due in May as well.  She is overdue for dental and eye exams and I  recommended she call and schedule these.  Counseled on healthy diet and exercise.  Immunizations reviewed.  Discussed safety.  Handout provided with screening guideline recommendations  Essential hypertension -Blood pressure is in goal range.  Continue on current medication.  Recommend she check her blood pressure at home more often.  Discussed low-sodium diet  Aortic atherosclerosis ([facility]) - Plan: EKG 12-Lead, Lipid panel -Discussed that I cannot tell her how bad this is.  Recommend good blood pressure and cholesterol control  Mixed hyperlipidemia - Plan: Lipid panel -Continue on statin therapy.  Check lipid panel and follow-up  Elevated serum creatinine - Plan: Comprehensive metabolic panel -Stressed importance of good blood pressure control and we will continue to monitor  Primary hyperparathyroidism ([place]) - Plan: TSH, T4, free -Managed by Dr. Cruzita Rowe  Vitamin D deficiency - Plan: VITAMIN D 25 Hydroxy (Vit-D Deficiency, Fractures) -Continue on supplement and adjust dose pending vitamin D level   At risk for sexually transmitted disease due to partner with HIV - Plan: HIV Antibody (routine testing w rflx)   Screening for heart disease - Plan: EKG 12-Lead EKG- SR, rate 59, left axis. basically unchanged since 2018   Discussed monthly self breast exams and yearly mammograms; at least 30 minutes of aerobic activity at least 5 days/week and weight-bearing exercise 2x/week; proper sunscreen use reviewed; healthy diet, including goals of calcium and vitamin D intake and alcohol recommendations (less than or equal to 1 drink/day) reviewed; regular seatbelt use; changing batteries in smoke detectors.  Immunization recommendations discussed.  Colonoscopy recommendations reviewed   Medicare Attestation I have personally reviewed: The patient's medical and social history Their use of alcohol, tobacco or illicit drugs Their current medications and supplements The patient's functional ability  including ADLs,fall risks, home safety risks, cognitive, and hearing and visual impairment Diet and  physical activities Evidence for depression or mood disorders  The patient's weight, height, and BMI have been recorded in the chart.  I have made referrals, counseling, and provided education to the patient based on review of the above and I have provided the patient with a written personalized care plan for preventive services.     Harland Dingwall, NP-C   10/31/2019

## 2019-10-31 ENCOUNTER — Encounter: Payer: Self-pay | Admitting: Family Medicine

## 2019-10-31 ENCOUNTER — Ambulatory Visit: Payer: Medicare PPO | Admitting: Family Medicine

## 2019-10-31 ENCOUNTER — Other Ambulatory Visit: Payer: Self-pay

## 2019-10-31 VITALS — BP 130/80 | HR 76 | Temp 97.3°F | Wt 189.2 lb

## 2019-10-31 DIAGNOSIS — Z9189 Other specified personal risk factors, not elsewhere classified: Secondary | ICD-10-CM

## 2019-10-31 DIAGNOSIS — I700 Atherosclerosis of aorta: Secondary | ICD-10-CM | POA: Diagnosis not present

## 2019-10-31 DIAGNOSIS — Z0000 Encounter for general adult medical examination without abnormal findings: Secondary | ICD-10-CM

## 2019-10-31 DIAGNOSIS — E559 Vitamin D deficiency, unspecified: Secondary | ICD-10-CM | POA: Diagnosis not present

## 2019-10-31 DIAGNOSIS — I10 Essential (primary) hypertension: Secondary | ICD-10-CM | POA: Diagnosis not present

## 2019-10-31 DIAGNOSIS — Z136 Encounter for screening for cardiovascular disorders: Secondary | ICD-10-CM | POA: Diagnosis not present

## 2019-10-31 DIAGNOSIS — E210 Primary hyperparathyroidism: Secondary | ICD-10-CM | POA: Diagnosis not present

## 2019-10-31 DIAGNOSIS — R7989 Other specified abnormal findings of blood chemistry: Secondary | ICD-10-CM

## 2019-10-31 DIAGNOSIS — E782 Mixed hyperlipidemia: Secondary | ICD-10-CM | POA: Diagnosis not present

## 2019-10-31 NOTE — Patient Instructions (Addendum)
Tracy Rowe , Thank you for taking time to come for your Medicare Wellness Visit. I appreciate your ongoing commitment to your health goals. Please review the following plan we discussed and let me know if I can assist you in the future.   These are the goals we discussed:  You are due for your Cologuard (screening test for colon cancer) in May. We need to order this closer to that time. If you have not heard from [place] by mid May, let us know.   I recommend you schedule an eye exam and a dental exam.   Eat a well balanced diet.  Try to walk daily for exercise.   Continue your current medications. Follow up with Dr. Cruzita Lederer as recommended.   We will be in touch with your results.     This is a list of the screening recommended for you and due dates:  Health Maintenance  Topic Date Due  . Cologuard (Stool DNA test)  01/08/2020  . Tetanus Vaccine  10/31/2026  . Flu Shot  Completed  . DEXA scan (bone density measurement)  Completed  .  Hepatitis C: One time screening is recommended by Center for Disease Control  (CDC) for  adults born from [birth year] through 1965.   Completed  . Pneumonia vaccines  Completed       Preventive Care 10 Years and Older, Female Preventive care refers to lifestyle choices and visits with your health care provider that can promote health and wellness. This includes:  A yearly physical exam. This is also called an annual well check.  Regular dental and eye exams.  Immunizations.  Screening for certain conditions.  Healthy lifestyle choices, such as diet and exercise. What can I expect for my preventive care visit? Physical exam Your health care provider will check:  Height and weight. These may be used to calculate body mass index (BMI), which is a measurement that tells if you are at a healthy weight.  Heart rate and blood pressure.  Your skin for abnormal spots. Counseling Your health care provider may ask you questions about:  Alcohol,  tobacco, and drug use.  Emotional well-being.  Home and relationship well-being.  Sexual activity.  Eating habits.  History of falls.  Memory and ability to understand (cognition).  Work and work Statistician.  Pregnancy and menstrual history. What immunizations do I need?  Influenza (flu) vaccine  This is recommended every year. Tetanus, diphtheria, and pertussis (Tdap) vaccine  You may need a Td booster every 10 years. Varicella (chickenpox) vaccine  You may need this vaccine if you have not already been vaccinated. Zoster (shingles) vaccine  You may need this after age 18. Pneumococcal conjugate (PCV13) vaccine  One dose is recommended after age 41. Pneumococcal polysaccharide (PPSV23) vaccine  One dose is recommended after age 63. Measles, mumps, and rubella (MMR) vaccine  You may need at least one dose of MMR if you were born in 1957 or later. You may also need a second dose. Meningococcal conjugate (MenACWY) vaccine  You may need this if you have certain conditions. Hepatitis A vaccine  You may need this if you have certain conditions or if you travel or work in places where you may be exposed to hepatitis A. Hepatitis B vaccine  You may need this if you have certain conditions or if you travel or work in places where you may be exposed to hepatitis B. Haemophilus influenzae type b (Hib) vaccine  You may need this if you have  certain conditions. You may receive vaccines as individual doses or as more than one vaccine together in one shot (combination vaccines). Talk with your health care provider about the risks and benefits of combination vaccines. What tests do I need? Blood tests  Lipid and cholesterol levels. These may be checked every 5 years, or more frequently depending on your overall health.  Hepatitis C test.  Hepatitis B test. Screening  Lung cancer screening. You may have this screening every year starting at age 70 if you have a  30-pack-year history of smoking and currently smoke or have quit within the past 15 years.  Colorectal cancer screening. All adults should have this screening starting at age 51 and continuing until age 1. Your health care provider may recommend screening at age 37 if you are at increased risk. You will have tests every 1-10 years, depending on your results and the type of screening test.  Diabetes screening. This is done by checking your blood sugar (glucose) after you have not eaten for a while (fasting). You may have this done every 1-3 years.  Mammogram. This may be done every 1-2 years. Talk with your health care provider about how often you should have regular mammograms.  BRCA-related cancer screening. This may be done if you have a family history of breast, ovarian, tubal, or peritoneal cancers. Other tests  Sexually transmitted disease (STD) testing.  Bone density scan. This is done to screen for osteoporosis. You may have this done starting at age 36. Follow these instructions at home: Eating and drinking  Eat a diet that includes fresh fruits and vegetables, whole grains, lean protein, and low-fat dairy products. Limit your intake of foods with high amounts of sugar, saturated fats, and salt.  Take vitamin and mineral supplements as recommended by your health care provider.  Do not drink alcohol if your health care provider tells you not to drink.  If you drink alcohol: ? Limit how much you have to 0-1 drink a day. ? Be aware of how much alcohol is in your drink. In the [place] one drink equals one 12 oz bottle of beer (355 mL), one 5 oz glass of wine (148 mL), or one 1 oz glass of hard liquor (44 mL). Lifestyle  Take daily care of your teeth and gums.  Stay active. Exercise for at least 30 minutes on 5 or more days each week.  Do not use any products that contain nicotine or tobacco, such as cigarettes, e-cigarettes, and chewing tobacco. If you need help quitting, ask your  health care provider.  If you are sexually active, practice safe sex. Use a condom or other form of protection in order to prevent STIs (sexually transmitted infections).  Talk with your health care provider about taking a low-dose aspirin or statin. What's next?  Go to your health care provider once a year for a well check visit.  Ask your health care provider how often you should have your eyes and teeth checked.  Stay up to date on all vaccines. This information is not intended to replace advice given to you by your health care provider. Make sure you discuss any questions you have with your health care provider. Document Revised: 08/05/2018 Document Reviewed: 08/05/2018 Elsevier Patient Education  2020 [employer].

## 2019-11-01 LAB — CBC WITH DIFFERENTIAL/PLATELET
Basophils Absolute: 0 10*3/uL (ref 0.0–0.2)
Basos: 1 %
EOS (ABSOLUTE): 0.1 10*3/uL (ref 0.0–0.4)
Eos: 3 %
Hematocrit: 43.6 % (ref 34.0–46.6)
Hemoglobin: 13.5 g/dL (ref 11.1–15.9)
Immature Grans (Abs): 0 10*3/uL (ref 0.0–0.1)
Immature Granulocytes: 0 %
Lymphocytes Absolute: 1.3 10*3/uL (ref 0.7–3.1)
Lymphs: 34 %
MCH: 25.8 pg — ABNORMAL LOW (ref 26.6–33.0)
MCHC: 31 g/dL — ABNORMAL LOW (ref 31.5–35.7)
MCV: 83 fL (ref 79–97)
Monocytes Absolute: 0.3 10*3/uL (ref 0.1–0.9)
Monocytes: 7 %
Neutrophils Absolute: 2.2 10*3/uL (ref 1.4–7.0)
Neutrophils: 55 %
Platelets: 172 10*3/uL (ref 150–450)
RBC: 5.24 x10E6/uL (ref 3.77–5.28)
RDW: 13.4 % (ref 11.7–15.4)
WBC: 3.9 10*3/uL (ref 3.4–10.8)

## 2019-11-01 LAB — COMPREHENSIVE METABOLIC PANEL
ALT: 49 IU/L — ABNORMAL HIGH (ref 0–32)
AST: 66 IU/L — ABNORMAL HIGH (ref 0–40)
Albumin/Globulin Ratio: 1.6 (ref 1.2–2.2)
Albumin: 4.2 g/dL (ref 3.7–4.7)
Alkaline Phosphatase: 92 IU/L (ref 39–117)
BUN/Creatinine Ratio: 11 — ABNORMAL LOW (ref 12–28)
BUN: 12 mg/dL (ref 8–27)
Bilirubin Total: 0.8 mg/dL (ref 0.0–1.2)
CO2: 25 mmol/L (ref 20–29)
Calcium: 9.2 mg/dL (ref 8.7–10.3)
Chloride: 106 mmol/L (ref 96–106)
Creatinine, Ser: 1.11 mg/dL — ABNORMAL HIGH (ref 0.57–1.00)
GFR calc Af Amer: 56 mL/min/{1.73_m2} — ABNORMAL LOW (ref >59)
GFR calc non Af Amer: 49 mL/min/{1.73_m2} — ABNORMAL LOW (ref >59)
Globulin, Total: 2.7 g/dL (ref 1.5–4.5)
Glucose: 101 mg/dL — ABNORMAL HIGH (ref 65–99)
Potassium: 4.3 mmol/L (ref 3.5–5.2)
Sodium: 145 mmol/L — ABNORMAL HIGH (ref 134–144)
Total Protein: 6.9 g/dL (ref 6.0–8.5)

## 2019-11-01 LAB — TSH: TSH: 10.1 u[IU]/mL — ABNORMAL HIGH (ref 0.450–4.500)

## 2019-11-01 LAB — VITAMIN D 25 HYDROXY (VIT D DEFICIENCY, FRACTURES): Vit D, 25-Hydroxy: 44.6 ng/mL (ref 30.0–100.0)

## 2019-11-01 LAB — HIV ANTIBODY (ROUTINE TESTING W REFLEX): HIV Screen 4th Generation wRfx: NONREACTIVE

## 2019-11-01 LAB — LIPID PANEL
Chol/HDL Ratio: 3.4 ratio (ref 0.0–4.4)
Cholesterol, Total: 134 mg/dL (ref 100–199)
HDL: 40 mg/dL (ref >39)
LDL Chol Calc (NIH): 78 mg/dL (ref 0–99)
Triglycerides: 83 mg/dL (ref 0–149)
VLDL Cholesterol Cal: 16 mg/dL (ref 5–40)

## 2019-11-01 LAB — T4, FREE: Free T4: 1.07 ng/dL (ref 0.82–1.77)

## 2019-11-01 NOTE — Progress Notes (Signed)
Please ask Tracy Rowe to add an acute hepatitis panel due to elevated liver enzymes.

## 2019-11-02 ENCOUNTER — Telehealth: Payer: Self-pay

## 2019-11-02 ENCOUNTER — Other Ambulatory Visit: Payer: Self-pay | Admitting: Internal Medicine

## 2019-11-02 DIAGNOSIS — E890 Postprocedural hypothyroidism: Secondary | ICD-10-CM

## 2019-11-02 DIAGNOSIS — R748 Abnormal levels of other serum enzymes: Secondary | ICD-10-CM

## 2019-11-02 NOTE — Telephone Encounter (Signed)
-----   Message from [name], MD sent at 11/01/2019 12:23 PM EST ----- Lenna Sciara, can you please call pt:  This patient had a high TSH, at 10, and she is not taking the levothyroxine due to hot flashes.  At last visit we started 25 mcg daily.  Can you please ask her if she like to only take half a tablet a day (12.5 mg daily) since her TSH was still high.  She will need a TSH and a free T4 in 1.5 months.  Can you please order this?

## 2019-11-02 NOTE — Telephone Encounter (Signed)
Notified patient of message from Dr. Cruzita Lederer, patient expressed understanding and agreement. No further questions.  Patient will try half a tab.  Labs ordered.

## 2019-11-02 NOTE — Progress Notes (Signed)
Her liver enzymes are elevated and we need to recheck these in a week or two. Has she had any alcohol lately or is she taking any over the counter pain medication such as Aleve, motrin, ibuprofen, Advil or aspirin? If so, then stop until she has repeat labs.  Also her thyroid function was abnormal and Dr. Cruzita Lederer will have her nurse call about this if they haven't already. Otherwise her labs are fine.  Please order CMP for elevated liver enzymes.

## 2019-11-09 LAB — HEPATITIS PANEL, ACUTE
Hep A IgM: NEGATIVE
Hep B C IgM: NEGATIVE
Hep C Virus Ab: <0.1 s/co ratio (ref 0.0–0.9)
Hepatitis B Surface Ag: NEGATIVE

## 2019-11-09 LAB — SPECIMEN STATUS REPORT

## 2019-11-10 ENCOUNTER — Other Ambulatory Visit: Payer: Medicare PPO

## 2019-11-10 ENCOUNTER — Other Ambulatory Visit: Payer: Self-pay

## 2019-11-10 DIAGNOSIS — R748 Abnormal levels of other serum enzymes: Secondary | ICD-10-CM | POA: Diagnosis not present

## 2019-11-11 ENCOUNTER — Other Ambulatory Visit: Payer: Self-pay | Admitting: Internal Medicine

## 2019-11-11 DIAGNOSIS — R748 Abnormal levels of other serum enzymes: Secondary | ICD-10-CM

## 2019-11-11 LAB — COMPREHENSIVE METABOLIC PANEL
ALT: 63 IU/L — ABNORMAL HIGH (ref 0–32)
AST: 82 IU/L — ABNORMAL HIGH (ref 0–40)
Albumin/Globulin Ratio: 1.6 (ref 1.2–2.2)
Albumin: 4.3 g/dL (ref 3.7–4.7)
Alkaline Phosphatase: 91 IU/L (ref 39–117)
BUN/Creatinine Ratio: 11 — ABNORMAL LOW (ref 12–28)
BUN: 13 mg/dL (ref 8–27)
Bilirubin Total: 0.8 mg/dL (ref 0.0–1.2)
CO2: 25 mmol/L (ref 20–29)
Calcium: 9.7 mg/dL (ref 8.7–10.3)
Chloride: 106 mmol/L (ref 96–106)
Creatinine, Ser: 1.14 mg/dL — ABNORMAL HIGH (ref 0.57–1.00)
GFR calc Af Amer: 54 mL/min/{1.73_m2} — ABNORMAL LOW (ref >59)
GFR calc non Af Amer: 47 mL/min/{1.73_m2} — ABNORMAL LOW (ref >59)
Globulin, Total: 2.7 g/dL (ref 1.5–4.5)
Glucose: 92 mg/dL (ref 65–99)
Potassium: 4 mmol/L (ref 3.5–5.2)
Sodium: 146 mmol/L — ABNORMAL HIGH (ref 134–144)
Total Protein: 7 g/dL (ref 6.0–8.5)

## 2019-11-11 NOTE — Progress Notes (Signed)
I called Tracy Rowe and explained her results and the fact that her liver enzymes are still elevated. Please order a RUQ [place] for her and let her know the details. Thanks.

## 2019-11-18 ENCOUNTER — Ambulatory Visit
Admission: RE | Admit: 2019-11-18 | Discharge: 2019-11-18 | Disposition: A | Discharge status: Home / Self Care | Payer: Medicare PPO | Source: Ambulatory Visit | Attending: Family Medicine | Admitting: Family Medicine

## 2019-11-18 DIAGNOSIS — R748 Abnormal levels of other serum enzymes: Secondary | ICD-10-CM | POA: Diagnosis not present

## 2019-11-18 NOTE — Progress Notes (Signed)
Her liver is normal on the ultrasound. Let's have her come back in to recheck liver in 2 weeks. If it is still elevated, we will need to have her follow up with GI. Please order a CMP due to elevated liver enzymes.

## 2019-11-21 ENCOUNTER — Other Ambulatory Visit: Payer: Self-pay | Admitting: Internal Medicine

## 2019-11-21 DIAGNOSIS — R748 Abnormal levels of other serum enzymes: Secondary | ICD-10-CM

## 2019-12-06 ENCOUNTER — Other Ambulatory Visit: Payer: Medicare PPO

## 2019-12-06 ENCOUNTER — Other Ambulatory Visit: Payer: Self-pay

## 2019-12-06 DIAGNOSIS — R748 Abnormal levels of other serum enzymes: Secondary | ICD-10-CM | POA: Diagnosis not present

## 2019-12-06 LAB — COMPREHENSIVE METABOLIC PANEL
ALT: 27 IU/L (ref 0–32)
AST: 43 IU/L — ABNORMAL HIGH (ref 0–40)
Albumin/Globulin Ratio: 1.5 (ref 1.2–2.2)
Albumin: 4 g/dL (ref 3.7–4.7)
Alkaline Phosphatase: 73 IU/L (ref 39–117)
BUN/Creatinine Ratio: 10 — ABNORMAL LOW (ref 12–28)
BUN: 11 mg/dL (ref 8–27)
Bilirubin Total: 1 mg/dL (ref 0.0–1.2)
CO2: 25 mmol/L (ref 20–29)
Calcium: 9.5 mg/dL (ref 8.7–10.3)
Chloride: 108 mmol/L — ABNORMAL HIGH (ref 96–106)
Creatinine, Ser: 1.1 mg/dL — ABNORMAL HIGH (ref 0.57–1.00)
GFR calc Af Amer: 57 mL/min/{1.73_m2} — ABNORMAL LOW (ref >59)
GFR calc non Af Amer: 49 mL/min/{1.73_m2} — ABNORMAL LOW (ref >59)
Globulin, Total: 2.7 g/dL (ref 1.5–4.5)
Glucose: 90 mg/dL (ref 65–99)
Potassium: 3.6 mmol/L (ref 3.5–5.2)
Sodium: 146 mmol/L — ABNORMAL HIGH (ref 134–144)
Total Protein: 6.7 g/dL (ref 6.0–8.5)

## 2019-12-12 ENCOUNTER — Other Ambulatory Visit: Payer: Self-pay | Admitting: Internal Medicine

## 2019-12-23 ENCOUNTER — Other Ambulatory Visit: Payer: Self-pay | Admitting: Family Medicine

## 2019-12-23 DIAGNOSIS — Z1231 Encounter for screening mammogram for malignant neoplasm of breast: Secondary | ICD-10-CM

## 2020-01-02 ENCOUNTER — Telehealth: Payer: Self-pay | Admitting: Internal Medicine

## 2020-01-02 MED ORDER — FUROSEMIDE 20 MG PO TABS: 20.0000 mg | ORAL_TABLET | Freq: Every day | ORAL | 5 refills | Status: DC

## 2020-01-02 NOTE — Telephone Encounter (Signed)
Pt request for a refill on lasix

## 2020-01-24 ENCOUNTER — Other Ambulatory Visit: Payer: Self-pay

## 2020-01-24 ENCOUNTER — Ambulatory Visit
Admission: RE | Admit: 2020-01-24 | Discharge: 2020-01-24 | Disposition: A | Discharge status: Home / Self Care | Payer: Medicare PPO | Source: Ambulatory Visit | Attending: Family Medicine | Admitting: Family Medicine

## 2020-01-24 DIAGNOSIS — Z1231 Encounter for screening mammogram for malignant neoplasm of breast: Secondary | ICD-10-CM | POA: Diagnosis not present

## 2020-02-29 ENCOUNTER — Encounter: Payer: Self-pay | Admitting: Internal Medicine

## 2020-02-29 ENCOUNTER — Other Ambulatory Visit: Payer: Self-pay

## 2020-02-29 ENCOUNTER — Ambulatory Visit: Payer: Medicare PPO | Admitting: Internal Medicine

## 2020-02-29 VITALS — BP 142/90 | HR 68 | Ht 67.5 in | Wt 188.0 lb

## 2020-02-29 DIAGNOSIS — E041 Nontoxic single thyroid nodule: Secondary | ICD-10-CM

## 2020-02-29 DIAGNOSIS — M8580 Other specified disorders of bone density and structure, unspecified site: Secondary | ICD-10-CM | POA: Diagnosis not present

## 2020-02-29 DIAGNOSIS — E559 Vitamin D deficiency, unspecified: Secondary | ICD-10-CM | POA: Diagnosis not present

## 2020-02-29 DIAGNOSIS — E210 Primary hyperparathyroidism: Secondary | ICD-10-CM | POA: Diagnosis not present

## 2020-02-29 DIAGNOSIS — E890 Postprocedural hypothyroidism: Secondary | ICD-10-CM

## 2020-02-29 LAB — TSH: TSH: 11.06 u[IU]/mL — ABNORMAL HIGH (ref 0.35–4.50)

## 2020-02-29 LAB — T4, FREE: Free T4: 0.9 ng/dL (ref 0.60–1.60)

## 2020-02-29 NOTE — Progress Notes (Signed)
Patient ID: [name], female   DOB: 14-Aug-1946, 74 y.o.   MRN: [id number]   This visit occurred during the SARS-CoV-2 public health emergency.  Safety protocols were in place, including screening questions prior to the visit, additional usage of staff PPE, and extensive cleaning of exam room while observing appropriate contact time as indicated for disinfecting solutions.   HPI  Tracy Rowe is a 74 y.o. female, returning for follow-up for h/o primary hyperparathyroidism, vitamin D deficiency, osteopenia, thyroid nodule, and now postsurgical hypothyroidism diagnosed after last visit. Last visit 1 year ago.  Primary hyperparathyroidism: She was diagnosed with hypercalcemia in 09/2016 but she had a technetium scan positive for parathyroid adenoma in 2002.  Reviewed pertinent labs: Lab Results  Component Value Date   PTH 52 12/23/2017   PTH Comment 12/23/2017   PTH 95 (H) 08/14/2017   PTH Comment 08/14/2017   PTH 55 03/02/2017   PTH 142 (H) 11/26/2016   CALCIUM 9.5 12/06/2019   CALCIUM 9.7 11/10/2019   CALCIUM 9.2 10/31/2019   CALCIUM 9.4 06/29/2019   CALCIUM 9.2 10/29/2018   CALCIUM 9.3 05/07/2018   CALCIUM 9.5 05/03/2018   CALCIUM 9.1 12/23/2017   CALCIUM 9.8 10/26/2017   CALCIUM 9.9 09/04/2017   She had an indeterminate Tc sestamibi scan (2002): NO DEFINITE PARATHYROID ADENOPATHY LOCALIZED IN THE NECK OR CHEST ALTHOUGH THERE IS A FOCUS OF MILDLY PROMINENT ACTIVITY AT THE INFERIOR POLE OF THE LEFT LOBE OF THE THYROID WHICH WOULD BE THE MOST LIKELY SITE ON THIS SCAN.  Other labs reviewed: Component     Latest Ref Rng & Units 03/02/2017  Vitamin D 1, 25 (OH) Total     18 - 72 pg/mL 68  Vitamin D3 1, 25 (OH)     pg/mL 44  Vitamin D2 1, 25 (OH)     pg/mL 24  Phosphorus     2.3 - 4.6 mg/dL 2.4  Magnesium     1.5 - 2.5 mg/dL 1.8   Component     Latest Ref Rng & Units 03/04/2017  Creatinine, Urine     20 - 320 mg/dL 63  Creatinine, 24H Ur     0.63 - 2.50 g/24 h 0.50  (L)  Calcium, Ur     Not estab mg/dL 3  Calcium, 24 hour urine     35 - 250 mg/24 h 24 (L)  Urinary calcium is low, but the creatinine was also low.  I did not ask her to repeat a collection, since even a higher creatinine level will most likely not push her over the limit for hypercalciuria.    She had the following investigations:  Technetium sestamibi parathyroid scan (05/05/2017): Left inferior parathyroid adenoma and a possible right superior parathyroid adenoma versus thyroid nodule.   Thyroid ultrasound (05/20/2017): Several nodules, of which one right superior measuring 1.6 x 1.3 x 1.3, solid, hypoechoic; another left inferior 1.9 x 1.3 x 1.3 cm, mixed, hypoechoic, presumed to be an enlarged parathyroid gland  FNA of the 1.6 cm nodule (07/08/2017): Scant epithelium (Bethesda category 1)  She had parathyroidectomy + right lobectomy on 09/03/2017.  Left inferior parathyroid adenoma was resected.  The right superior parathyroid did not contain an adenoma.  R Thyroid nodule was benign.  Diagnosis 1. Parathyroid gland, left inferior - HYPERCELLULAR PARATHYROID TISSUE CONSISTENT WITH ADENOMA 2. Parathyroid gland, right superior - PARATHYROID TISSUE 3. Thyroid, lobectomy, right lobe - BENIGN FOLLICULAR ADENOMA (1.2 CM) The right thyroid lobe has an adenomatous lesion which has  a very thin fibrous capsule consistent with follicular adenoma. There is no evidence of capsular or vascular invasion. The surrounding thyroid parenchyma is unremarkable.  Calcium normalized after surgery.  Osteopenia: Reviewed patient's DXA scan reports: Date L1-4 T score FN T score  05/25/2019  L2, L3: -2.3 (+1.3%) RFN: -2.1 LFN: -1.9 Overall: +10.8%*  11/19/2016 L2, L3:-2.4 RFN: -2.3 LFN: -2.3  08/09/2007 L1, L3, L4: -2.0 RFN: n/a LFN: -1.8  Per review of the chart, she was approved for Prolia.  No falls or fractures.  No history of kidney stones.  + Mild CKD. Last BUN/Cr: Lab Results   Component Value Date   BUN 11 12/06/2019   BUN 13 11/10/2019   BUN 12 10/31/2019   BUN 14 06/29/2019   BUN 11 10/29/2018   BUN 18 05/07/2018   BUN 14 05/03/2018   BUN 11 10/26/2017   BUN 22 (H) 09/04/2017   BUN 13 08/24/2017   Lab Results  Component Value Date   CREATININE 1.10 (H) 12/06/2019   CREATININE 1.14 (H) 11/10/2019   CREATININE 1.11 (H) 10/31/2019   CREATININE 1.23 (H) 06/29/2019   CREATININE 1.16 (H) 10/29/2018   CREATININE 1.33 (H) 05/07/2018   CREATININE 1.28 (H) 05/03/2018   CREATININE 1.14 (H) 10/26/2017   CREATININE 1.58 (H) 09/04/2017   CREATININE 1.03 (H) 08/24/2017   She was on HCTZ in the past and we changed to Lasix during investigation for hyperparathyroidism.  She has a history of vitamin D deficiency and she was on ergocalciferol in the past.  She is currently on 2000 units vitamin D daily.  Her vitamin D level was normal at last check Lab Results  Component Value Date   VD25OH 44.6 10/31/2019   VD25OH 37.23 03/01/2019   VD25OH 39.38 12/23/2017   VD25OH 38.66 08/14/2017   VD25OH 26.51 (L) 05/15/2017   VD25OH 31.30 03/02/2017   VD25OH 30 12/29/2016   VD25OH 10 (L) 10/23/2016   Pt does not have a FH of osteoporosis.  Pt. also has a history of HTN.  She developed postop hypothyroidism after her right thyroid lobectomy.  In 02/2019 I advised her to start levothyroxine 25 mcg daily.  She develops hot flashes and stopped.  In 10/2019, TSH was still high so I advised her to start a lower dose of levothyroxine, 12.5 mcg daily -she restarted taking it, but now taking 25 mcg daily.   She continues to have hot flushes and itching right after she takes her levothyroxine tablet.  These are not as prominent if she misses the tablet.  Pt is on levothyroxine 25 mcg daily, taken: - misses 2 days/ week! - in am - fasting - at least 30 min from b'fast - no Ca, Fe, MVI, PPIs - not on Biotin  Reviewed her TFTs: Lab Results  Component Value Date   TSH  10.100 (H) 10/31/2019   TSH 11.65 (H) 03/01/2019   TSH 4.42 08/31/2018   TSH 5.00 (H) 05/31/2018   TSH 5.630 (H) 05/03/2018   TSH 4.48 12/23/2017   TSH 3.930 10/26/2017   TSH 2.70 11/26/2016   FREET4 1.07 10/31/2019   FREET4 0.73 03/01/2019   FREET4 0.89 08/31/2018   FREET4 0.81 05/31/2018   FREET4 1.18 05/03/2018   FREET4 0.79 12/23/2017   T3FREE 2.6 03/01/2019   T3FREE 2.6 08/31/2018   T3FREE 2.5 05/31/2018   T3FREE 3.2 12/23/2017    ROS: Constitutional: no weight gain/no weight loss, no fatigue, + subjective hyperthermia, no subjective hypothermia Eyes: no blurry vision,  no xerophthalmia ENT: no sore throat, no nodules palpated in neck, no dysphagia, no odynophagia, no hoarseness Cardiovascular: no CP/no SOB/no palpitations/no leg swelling Respiratory: no cough/no SOB/no wheezing Gastrointestinal: no N/no V/no D/no C/no acid reflux Musculoskeletal: no muscle aches/no joint aches Skin: no rashes, no hair loss, +itching Neurological: no tremors/no numbness/no tingling/no dizziness  I reviewed pt's medications, allergies, PMH, social hx, family hx, and changes were documented in the history of present illness. Otherwise, unchanged from my initial visit note.  Past Medical History:  Diagnosis Date  . Aortic atherosclerosis ([place]) 06/28/2019  . HTN (hypertension)   . Hyperlipidemia   . Osteoporosis    Past Surgical History:  Procedure Laterality Date  . ABDOMINAL HYSTERECTOMY    . PARATHYROIDECTOMY N/A 09/03/2017   Procedure: NECK EXPLORATION WITH LEFT INFERIOR PARATHYROIDECTOMY AND RIGHT SUPERIOR PARATHYROIDECTOMY;  Surgeon: Armandina Gemma, MD;  Location: [facility];  Service: General;  Laterality: N/A;  . THYROID LOBECTOMY Right 09/03/2017   Procedure: RIGHT THYROID LOBECTOMY;  Surgeon: Armandina Gemma, MD;  Location: [facility];  Service: General;  Laterality: Right;   Social History   Socioeconomic History  . Marital status: Married    Spouse name: Not on file  . Number of  children: Not on file  . Years of education: Not on file  . Highest education level: Not on file  Occupational History  . Not on file  Tobacco Use  . Smoking status: Never Smoker  . Smokeless tobacco: Never Used  Vaping Use  . Vaping Use: Never used  Substance and Sexual Activity  . Alcohol use: No  . Drug use: No  . Sexual activity: Never  Other Topics Concern  . Not on file  Social History Narrative  . Not on file   Social Determinants of Health   Financial Resource Strain:   . Difficulty of Paying Living Expenses:   Food Insecurity:   . Worried About Charity fundraiser in the Last Year:   . Arboriculturist in the Last Year:   Transportation Needs:   . Film/video editor (Medical):   Marland Kitchen Lack of Transportation (Non-Medical):   Physical Activity:   . Days of Exercise per Week:   . Minutes of Exercise per Session:   Stress:   . Feeling of Stress :   Social Connections:   . Frequency of Communication with Friends and Family:   . Frequency of Social Gatherings with Friends and Family:   . Attends Religious Services:   . Active Member of Clubs or Organizations:   . Attends Archivist Meetings:   Marland Kitchen Marital Status:   Intimate Partner Violence:   . Fear of Current or Ex-Partner:   . Emotionally Abused:   Marland Kitchen Physically Abused:   . Sexually Abused:    Current Outpatient Medications on File Prior to Visit  Medication Sig Dispense Refill  . atorvastatin (LIPITOR) 40 MG tablet Take 1 tablet (40 mg total) by mouth daily. 90 tablet 1  . fluticasone (FLONASE) 50 MCG/ACT nasal spray Place 2 sprays into both nostrils daily. 16 g 1  . furosemide (LASIX) 20 MG tablet Take 1 tablet (20 mg total) by mouth daily. 30 tablet 5  . lisinopril (ZESTRIL) 20 MG tablet Take 1 tablet by mouth daily 90 tablet 0  . polyethylene glycol powder (GLYCOLAX/MIRALAX) powder Take 17 g by mouth 2 (two) times daily as needed. (Patient taking differently: Take 17 g by mouth 2 (two) times daily  as needed for mild  constipation. ) 3350 g 1  . Vitamin D, Cholecalciferol, 1000 units CAPS Take 2,000 Units by mouth daily.      No current facility-administered medications on file prior to visit.   No Known Allergies Family History  Problem Relation Age of Onset  . Diabetes Sister   . Hypertension Sister   . Hypertension Brother   . Heart disease Brother    PE: BP (!) 142/90   Pulse 68   Ht 5' 7.5" (1.715 m)   Wt 188 lb (85.3 kg)   SpO2 99%   BMI 29.01 kg/m  Wt Readings from Last 3 Encounters:  02/29/20 188 lb (85.3 kg)  10/31/19 189 lb 3.2 oz (85.8 kg)  06/29/19 187 lb 12.8 oz (85.2 kg)   Constitutional: overweight, in NAD Eyes: PERRLA, EOMI, no exophthalmos ENT: moist mucous membranes, no thyromegaly, no cervical lymphadenopathy Cardiovascular: RRR, No MRG Respiratory: CTA B Gastrointestinal: abdomen soft, NT, ND, BS+ Musculoskeletal: no deformities, strength intact in all 4 Skin: moist, warm, no rashes Neurological: no tremor with outstretched hands, DTR normal in all 4  Assessment: 1. Hypercalcemia/Primary hyperparathyroidism  2.  Osteopenia  3. Vit D def  4.  Thyroid nodule  Plan: 1. Patient with history of primary hyperparathyroidism with a previous calcium level being as high as 11.1 and a PTH as high as 142.  The hyperparathyroidism persisted even after normalization of her vitamin D.  Of note, she does have CKD which can raise the PTH, but not severe enough to cause this degree of hyperparathyroidism.  Investigation pointed towards a left inferior and right superior parathyroid adenomas.  Thyroid ultrasound showed a left inferior nodule, most consistent with a parathyroid adenoma and she also had a thyroid nodule that was found incidentally at the time of the surgery.  This was benign.  She had 2 gland parathyroidectomy 1.5 years ago and the pathology showed that only the left inferior gland was adenomatous. -Calcium and PTH levels normalized. -Most recent  calcium level was normal 12/23/2019: 9.5.  2.  Osteopenia -Reviewed her most recent DXA scan reports from 10/2016 and 05/25/2019: At the level of the spine, her T score is approximately the same (increased from -2.4 to -2.3), however, at the level of the femoral necks, her bone density increased by 10.8%*. -Since she is in the osteopenic range, we discussed about following her without medications for now and repeat a bone density scan next year -Of note, she was approved for Prolia in the past  3. Vit D def -Continue 2000 units vitamin D daily -We reviewed the vitamin D level from last visit and this was normal.  She had another vitamin D level which was normal at 44.6 on 10/31/2019. -We will not recheck a level today  4. Thyroid nodule -I reviewed the latest thyroid ultrasound report from 04/2017: Right-sided thyroid nodule, with the largest dimension 1.6 cm.  Biopsy of this nodule was inconclusive due to scant epithelial cells and we discussed about repeating the biopsy but she refused to have another biopsy due to the discomfort of the first one.  She had right lobectomy at the time of her para thyroid surgery and the thyroid nodule was benign  5.  Postsurgical hypothyroidism -We started levothyroxine 25 mcg daily, she developed hot flashes >> stopped -Since her TSH is still elevated, I advised her to restart at a lower dose, 12.5 mcg daily, but she is on 25 mcg daily - latest thyroid labs reviewed with pt >> still elevated: Lab  Results  Component Value Date   TSH 10.100 (H) 10/31/2019   - she continues on LT4 25 mcg daily - pt has hot flashes and itching after she takes a tablet - At this visit we discussed about options: Switching to Tirosint which appears to be covered by her insurance, switching to brand name Synthroid, or to desiccated thyroid extract. - we discussed about taking the thyroid hormone every day, with water, >30 minutes before breakfast, separated by >4 hours from acid  reflux medications, calcium, iron, multivitamins. Pt. is taking it correctly, but I advised her to take the medication every day. - We discussed about consequences of persistent, uncontrolled, hypothyroidism - will check thyroid tests today: TSH and fT4.  We will decide about how to change the treatment after the results are back. - If labs are abnormal, she will need to return for repeat TFTs in 1.5 months  Component     Latest Ref Rng & Units 02/29/2020  TSH     0.35 - 4.50 uIU/mL 11.06 (H)  T4,Free(Direct)     0.60 - 1.60 ng/dL 0.90   TSH is still elevated so we will need to increase the dose of levothyroxine.  We will try to send Tirosint 50 mcg daily.  Will need a repeat set of labs in 1.5 months.  [name], MD PhD [facility]

## 2020-02-29 NOTE — Patient Instructions (Addendum)
Please continue Levothyroxine 25 mcg daily.  Take the thyroid hormone every day, with water, at least 30 minutes before breakfast, separated by at least 4 hours from: - acid reflux medications - calcium - iron - multivitamins  Please stop at the lab.  Continue vitamin D 2000 units daily.  Please come back for a follow-up appointment in 6 months.

## 2020-03-01 MED ORDER — TIROSINT 50 MCG PO CAPS: ORAL_CAPSULE | ORAL | 5 refills | Status: DC

## 2020-03-18 ENCOUNTER — Other Ambulatory Visit: Payer: Self-pay | Admitting: Internal Medicine

## 2020-04-09 ENCOUNTER — Other Ambulatory Visit: Payer: Self-pay | Admitting: Family Medicine

## 2020-04-09 DIAGNOSIS — I10 Essential (primary) hypertension: Secondary | ICD-10-CM

## 2020-04-26 ENCOUNTER — Other Ambulatory Visit: Payer: Self-pay | Admitting: Family Medicine

## 2020-05-03 ENCOUNTER — Other Ambulatory Visit: Payer: Self-pay

## 2020-05-03 ENCOUNTER — Ambulatory Visit: Payer: Medicare PPO | Admitting: Family Medicine

## 2020-05-03 ENCOUNTER — Encounter: Payer: Self-pay | Admitting: Family Medicine

## 2020-05-03 VITALS — BP 122/80 | HR 67 | Wt 188.4 lb

## 2020-05-03 DIAGNOSIS — Z79899 Other long term (current) drug therapy: Secondary | ICD-10-CM

## 2020-05-03 DIAGNOSIS — E559 Vitamin D deficiency, unspecified: Secondary | ICD-10-CM | POA: Diagnosis not present

## 2020-05-03 DIAGNOSIS — E782 Mixed hyperlipidemia: Secondary | ICD-10-CM

## 2020-05-03 DIAGNOSIS — Z23 Encounter for immunization: Secondary | ICD-10-CM

## 2020-05-03 DIAGNOSIS — I10 Essential (primary) hypertension: Secondary | ICD-10-CM | POA: Diagnosis not present

## 2020-05-03 NOTE — Patient Instructions (Signed)
It was a pleasure seeing you today.  Continue your current medications.  Make sure you are eating a healthy diet and staying as active as possible.  I will be in touch with your results.  Let me know if you have any concerns, otherwise I will see you back when you are due for your Medicare wellness visit

## 2020-05-03 NOTE — Progress Notes (Signed)
° °  Subjective:    Patient ID: Tracy Rowe, female    DOB: 14-Dec-1945, 74 y.o.   MRN: [id number]  HPI Chief Complaint  Patient presents with   med check    med check, no concerns   She is here for medication management visit. She is followed by Dr. Cruzita Lederer for thyroid issues.   HTN-reports taking her medications daily without any side effects.  HL-states she is taking her statin daily and no concerns  Vitamin D def-taking vitamin D supplement daily.  Hot flashes- not as bad   Denies fever, chills, dizziness, chest pain, palpitations, shortness of breath, abdominal pain, N/V/D, urinary symptoms, LE edema.   Reviewed allergies, medications, past medical, surgical, family, and social history.    Review of Systems Pertinent positives and negatives in the history of present illness.     Objective:   Physical Exam BP 122/80    Pulse 67    Wt 188 lb 6.4 oz (85.5 kg)    BMI 29.07 kg/m   Alert and in no distress.  Cardiac exam shows a regular sinus rhythm without murmurs or gallops. Lungs are clear to auscultation.  Extremities without edema.  Skin is warm and dry.       Assessment & Plan:  Medication management - Plan: VITAMIN D 25 Hydroxy (Vit-D Deficiency, Fractures), Lipid panel  Mixed hyperlipidemia - Plan: Lipid panel  Essential hypertension - Plan: CBC with Differential/Platelet, Comprehensive metabolic panel  Needs flu shot - Plan: Flu Vaccine QUAD High Dose(Fluad)  Vitamin D deficiency - Plan: VITAMIN D 25 Hydroxy (Vit-D Deficiency, Fractures)  She is here for medication management visit.  She has no new concerns. Reports taking blood pressure medication and statin without any side effects. Hypertension is well controlled. Continue on current medications as well as vitamin D supplement. Check labs in follow-up

## 2020-05-04 LAB — CBC WITH DIFFERENTIAL/PLATELET
Basophils Absolute: 0 10*3/uL (ref 0.0–0.2)
Basos: 1 %
EOS (ABSOLUTE): 0.1 10*3/uL (ref 0.0–0.4)
Eos: 2 %
Hematocrit: 40.1 % (ref 34.0–46.6)
Hemoglobin: 13.4 g/dL (ref 11.1–15.9)
Immature Grans (Abs): 0 10*3/uL (ref 0.0–0.1)
Immature Granulocytes: 0 %
Lymphocytes Absolute: 1.3 10*3/uL (ref 0.7–3.1)
Lymphs: 24 %
MCH: 27.2 pg (ref 26.6–33.0)
MCHC: 33.4 g/dL (ref 31.5–35.7)
MCV: 82 fL (ref 79–97)
Monocytes Absolute: 0.4 10*3/uL (ref 0.1–0.9)
Monocytes: 7 %
Neutrophils Absolute: 3.5 10*3/uL (ref 1.4–7.0)
Neutrophils: 66 %
Platelets: 171 10*3/uL (ref 150–450)
RBC: 4.92 x10E6/uL (ref 3.77–5.28)
RDW: 13 % (ref 11.7–15.4)
WBC: 5.3 10*3/uL (ref 3.4–10.8)

## 2020-05-04 LAB — LIPID PANEL
Chol/HDL Ratio: 3.6 ratio (ref 0.0–4.4)
Cholesterol, Total: 170 mg/dL (ref 100–199)
HDL: 47 mg/dL (ref >39)
LDL Chol Calc (NIH): 104 mg/dL — ABNORMAL HIGH (ref 0–99)
Triglycerides: 107 mg/dL (ref 0–149)
VLDL Cholesterol Cal: 19 mg/dL (ref 5–40)

## 2020-05-04 LAB — COMPREHENSIVE METABOLIC PANEL
ALT: 15 IU/L (ref 0–32)
AST: 24 IU/L (ref 0–40)
Albumin/Globulin Ratio: 1.5 (ref 1.2–2.2)
Albumin: 4.4 g/dL (ref 3.7–4.7)
Alkaline Phosphatase: 76 IU/L (ref 48–121)
BUN/Creatinine Ratio: 13 (ref 12–28)
BUN: 15 mg/dL (ref 8–27)
Bilirubin Total: 1.2 mg/dL (ref 0.0–1.2)
CO2: 25 mmol/L (ref 20–29)
Calcium: 9.4 mg/dL (ref 8.7–10.3)
Chloride: 105 mmol/L (ref 96–106)
Creatinine, Ser: 1.14 mg/dL — ABNORMAL HIGH (ref 0.57–1.00)
GFR calc Af Amer: 54 mL/min/{1.73_m2} — ABNORMAL LOW (ref >59)
GFR calc non Af Amer: 47 mL/min/{1.73_m2} — ABNORMAL LOW (ref >59)
Globulin, Total: 3 g/dL (ref 1.5–4.5)
Glucose: 94 mg/dL (ref 65–99)
Potassium: 3.8 mmol/L (ref 3.5–5.2)
Sodium: 144 mmol/L (ref 134–144)
Total Protein: 7.4 g/dL (ref 6.0–8.5)

## 2020-05-04 LAB — VITAMIN D 25 HYDROXY (VIT D DEFICIENCY, FRACTURES): Vit D, 25-Hydroxy: 37.8 ng/mL (ref 30.0–100.0)

## 2020-05-25 ENCOUNTER — Other Ambulatory Visit: Payer: Self-pay | Admitting: Internal Medicine

## 2020-05-25 DIAGNOSIS — Z1211 Encounter for screening for malignant neoplasm of colon: Secondary | ICD-10-CM

## 2020-06-04 DIAGNOSIS — Z1211 Encounter for screening for malignant neoplasm of colon: Secondary | ICD-10-CM | POA: Diagnosis not present

## 2020-06-05 LAB — COLOGUARD: Cologuard: NEGATIVE

## 2020-06-12 LAB — COLOGUARD: COLOGUARD: NEGATIVE

## 2020-06-12 LAB — EXTERNAL GENERIC LAB PROCEDURE: COLOGUARD: NEGATIVE

## 2020-06-14 ENCOUNTER — Encounter: Payer: Self-pay | Admitting: Internal Medicine

## 2020-06-17 ENCOUNTER — Other Ambulatory Visit: Payer: Self-pay | Admitting: Family Medicine

## 2020-09-05 ENCOUNTER — Ambulatory Visit: Payer: Medicare PPO | Admitting: Internal Medicine

## 2020-09-05 ENCOUNTER — Encounter: Payer: Self-pay | Admitting: Internal Medicine

## 2020-09-05 ENCOUNTER — Other Ambulatory Visit: Payer: Self-pay

## 2020-09-05 VITALS — BP 150/100 | HR 77 | Ht 67.0 in | Wt 194.8 lb

## 2020-09-05 DIAGNOSIS — M8580 Other specified disorders of bone density and structure, unspecified site: Secondary | ICD-10-CM

## 2020-09-05 DIAGNOSIS — E890 Postprocedural hypothyroidism: Secondary | ICD-10-CM

## 2020-09-05 DIAGNOSIS — E041 Nontoxic single thyroid nodule: Secondary | ICD-10-CM

## 2020-09-05 DIAGNOSIS — E559 Vitamin D deficiency, unspecified: Secondary | ICD-10-CM | POA: Diagnosis not present

## 2020-09-05 DIAGNOSIS — E210 Primary hyperparathyroidism: Secondary | ICD-10-CM | POA: Diagnosis not present

## 2020-09-05 MED ORDER — TIROSINT 50 MCG PO CAPS: ORAL_CAPSULE | ORAL | 5 refills | Status: DC

## 2020-09-05 NOTE — Progress Notes (Signed)
Patient ID: Tracy Rowe, female   DOB: 1945-11-11, 75 y.o.   MRN: [id number]   This visit occurred during the SARS-CoV-2 public health emergency.  Safety protocols were in place, including screening questions prior to the visit, additional usage of staff PPE, and extensive cleaning of exam room while observing appropriate contact time as indicated for disinfecting solutions.   HPI  Tracy Rowe is a 75 y.o. female, returning for follow-up for h/o primary hyperparathyroidism, vitamin D deficiency, osteopenia, thyroid nodule, and now postsurgical hypothyroidism diagnosed after last visit. Last visit 6 months ago.  Primary hyperparathyroidism: She was diagnosed with hypercalcemia in 09/2016 but she had a technetium sestamibi scan that was positive for parathyroid adenoma in 2002.  Reviewed pertinent labs: Lab Results  Component Value Date   PTH 52 12/23/2017   PTH Comment 12/23/2017   PTH 95 (H) 08/14/2017   PTH Comment 08/14/2017   PTH 55 03/02/2017   PTH 142 (H) 11/26/2016   CALCIUM 9.4 05/03/2020   CALCIUM 9.5 12/06/2019   CALCIUM 9.7 11/10/2019   CALCIUM 9.2 10/31/2019   CALCIUM 9.4 06/29/2019   CALCIUM 9.2 10/29/2018   CALCIUM 9.3 05/07/2018   CALCIUM 9.5 05/03/2018   CALCIUM 9.1 12/23/2017   CALCIUM 9.8 10/26/2017   She had an indeterminate Tc sestamibi scan (2002): NO DEFINITE PARATHYROID ADENOPATHY LOCALIZED IN THE NECK OR CHEST ALTHOUGH THERE IS A FOCUS OF MILDLY PROMINENT ACTIVITY AT THE INFERIOR POLE OF THE LEFT LOBE OF THE THYROID WHICH WOULD BE THE MOST LIKELY SITE ON THIS SCAN.  Other labs reviewed: Component     Latest Ref Rng & Units 03/02/2017  Vitamin D 1, 25 (OH) Total     18 - 72 pg/mL 68  Vitamin D3 1, 25 (OH)     pg/mL 44  Vitamin D2 1, 25 (OH)     pg/mL 24  Phosphorus     2.3 - 4.6 mg/dL 2.4  Magnesium     1.5 - 2.5 mg/dL 1.8   Component     Latest Ref Rng & Units 03/04/2017  Creatinine, Urine     20 - 320 mg/dL 63  Creatinine, 24H Ur      0.63 - 2.50 g/24 h 0.50 (L)  Calcium, Ur     Not estab mg/dL 3  Calcium, 24 hour urine     35 - 250 mg/24 h 24 (L)  Urinary calcium is low, but the creatinine was also low.  I did not ask her to repeat a collection, since even a higher creatinine level will most likely not push her over the limit for hypercalciuria.    She had the following investigations:  Technetium sestamibi parathyroid scan (05/05/2017): Left inferior parathyroid adenoma and a possible right superior parathyroid adenoma versus thyroid nodule.   Thyroid ultrasound (05/20/2017): Several nodules, of which one right superior measuring 1.6 x 1.3 x 1.3, solid, hypoechoic; another left inferior 1.9 x 1.3 x 1.3 cm, mixed, hypoechoic, presumed to be an enlarged parathyroid gland  FNA of the 1.6 cm nodule (07/08/2017): Scant epithelium (Bethesda category 1)  She had parathyroidectomy + right lobectomy on 09/03/2017.  Left inferior parathyroid adenoma was resected.  The right superior parathyroid did not contain an adenoma.  R Thyroid nodule was benign.  Diagnosis 1. Parathyroid gland, left inferior - HYPERCELLULAR PARATHYROID TISSUE CONSISTENT WITH ADENOMA 2. Parathyroid gland, right superior - PARATHYROID TISSUE 3. Thyroid, lobectomy, right lobe - BENIGN FOLLICULAR ADENOMA (1.2 CM) The right thyroid lobe has an adenomatous  lesion which has a very thin fibrous capsule consistent with follicular adenoma. There is no evidence of capsular or vascular invasion. The surrounding thyroid parenchyma is unremarkable.  Calcium normalized after surgery.  Osteopenia: Reviewed previous DXA scan reports: Date L1-4 T score FN T score  05/25/2019  L2, L3: -2.3 (+1.3%) RFN: -2.1 LFN: -1.9 Overall: +10.8%*  11/19/2016 L2, L3:-2.4 RFN: -2.3 LFN: -2.3  08/09/2007 L1, L3, L4: -2.0 RFN: n/a LFN: -1.8  Per review of the chart, she was approved for Prolia.  No falls or fractures.  No history of kidney stones.  + Mild CKD.  Reviewed  BUN/Cr: Lab Results  Component Value Date   BUN 15 05/03/2020   BUN 11 12/06/2019   BUN 13 11/10/2019   BUN 12 10/31/2019   BUN 14 06/29/2019   BUN 11 10/29/2018   BUN 18 05/07/2018   BUN 14 05/03/2018   BUN 11 10/26/2017   BUN 22 (H) 09/04/2017   Lab Results  Component Value Date   CREATININE 1.14 (H) 05/03/2020   CREATININE 1.10 (H) 12/06/2019   CREATININE 1.14 (H) 11/10/2019   CREATININE 1.11 (H) 10/31/2019   CREATININE 1.23 (H) 06/29/2019   CREATININE 1.16 (H) 10/29/2018   CREATININE 1.33 (H) 05/07/2018   CREATININE 1.28 (H) 05/03/2018   CREATININE 1.14 (H) 10/26/2017   CREATININE 1.58 (H) 09/04/2017   She was previously on HCTZ in the past but we changed to Lasix during investigation for hyperparathyroidism.  History of vitamin D deficiency:  Reviewed her vitamin D levels: Lab Results  Component Value Date   VD25OH 37.8 05/03/2020   VD25OH 44.6 10/31/2019   VD25OH 37.23 03/01/2019   VD25OH 39.38 12/23/2017   VD25OH 38.66 08/14/2017   VD25OH 26.51 (L) 05/15/2017   VD25OH 31.30 03/02/2017   VD25OH 30 12/29/2016   VD25OH 10 (L) 10/23/2016   She takes 2000 units of vitamin D daily.  She was previously on ergocalciferol.  Pt does not have a FH of osteoporosis.  She also has HTN.   Postop hypothyroidism  -Developed after her right hemithyroidectomy in 2019  In 02/2019 I advised her to start levothyroxine 25 mcg daily.  She develops hot flashes and stopped.  In 10/2019, TSH was still high so I advised her to restart the lower dose of levothyroxine.  She continues to have hot flashes and itching right after she takes her levothyroxine tablet.    At last visit I suggested to switch to Tirosint (liquid levothyroxine) and increase the dose to 50 mcg daily.  At this visit, patient tells me that she is not on levothyroxine 50 mcg daily (Tirosint) as recommended, but actually takes 12.5 mcg LT4 daily!!!!  Upon questioning, she did not see the MyChart message and did  not receive a call from the pharmacy about Tirosint.  She takes LT4: -Was previously missing 2 days a week, now takes it daily - in am - fasting - at least 30 min from b'fast - no calcium - no iron - no multivitamins - no PPIs - not on Biotin  She is still having some hot flashes after she takes levothyroxine, but not as bad as when she was taking the full tablet.  Reviewed her TFTs: Lab Results  Component Value Date   TSH 11.06 (H) 02/29/2020   TSH 10.100 (H) 10/31/2019   TSH 11.65 (H) 03/01/2019   TSH 4.42 08/31/2018   TSH 5.00 (H) 05/31/2018   TSH 5.630 (H) 05/03/2018   TSH 4.48 12/23/2017  TSH 3.930 10/26/2017   TSH 2.70 11/26/2016   FREET4 0.90 02/29/2020   FREET4 1.07 10/31/2019   FREET4 0.73 03/01/2019   FREET4 0.89 08/31/2018   FREET4 0.81 05/31/2018   FREET4 1.18 05/03/2018   FREET4 0.79 12/23/2017   T3FREE 2.6 03/01/2019   T3FREE 2.6 08/31/2018   T3FREE 2.5 05/31/2018   T3FREE 3.2 12/23/2017   ROS: Constitutional: no weight gain/no weight loss, no fatigue, + subjective hyperthermia, no subjective hypothermia Eyes: no blurry vision, no xerophthalmia ENT: no sore throat, no nodules palpated in neck, no dysphagia, no odynophagia, no hoarseness Cardiovascular: no CP/no SOB/no palpitations/no leg swelling Respiratory: no cough/no SOB/no wheezing Gastrointestinal: no N/no V/no D/no C/no acid reflux Musculoskeletal: no muscle aches/no joint aches Skin: no rashes, no hair loss, + itching Neurological: no tremors/no numbness/no tingling/no dizziness  I reviewed pt's medications, allergies, PMH, social hx, family hx, and changes were documented in the history of present illness. Otherwise, unchanged from my initial visit note.  Past Medical History:  Diagnosis Date  . Aortic atherosclerosis ([place]) 06/28/2019  . HTN (hypertension)   . Hyperlipidemia   . Osteoporosis    Past Surgical History:  Procedure Laterality Date  . ABDOMINAL HYSTERECTOMY    .  PARATHYROIDECTOMY N/A 09/03/2017   Procedure: NECK EXPLORATION WITH LEFT INFERIOR PARATHYROIDECTOMY AND RIGHT SUPERIOR PARATHYROIDECTOMY;  Surgeon: Armandina Gemma, MD;  Location: [facility];  Service: General;  Laterality: N/A;  . THYROID LOBECTOMY Right 09/03/2017   Procedure: RIGHT THYROID LOBECTOMY;  Surgeon: Armandina Gemma, MD;  Location: [facility];  Service: General;  Laterality: Right;   Social History   Socioeconomic History  . Marital status: Married    Spouse name: Not on file  . Number of children: Not on file  . Years of education: Not on file  . Highest education level: Not on file  Occupational History  . Not on file  Tobacco Use  . Smoking status: Never Smoker  . Smokeless tobacco: Never Used  Vaping Use  . Vaping Use: Never used  Substance and Sexual Activity  . Alcohol use: No  . Drug use: No  . Sexual activity: Never  Other Topics Concern  . Not on file  Social History Narrative  . Not on file   Social Determinants of Health   Financial Resource Strain: Not on file  Food Insecurity: Not on file  Transportation Needs: Not on file  Physical Activity: Not on file  Stress: Not on file  Social Connections: Not on file  Intimate Partner Violence: Not on file   Current Outpatient Medications on File Prior to Visit  Medication Sig Dispense Refill  . atorvastatin (LIPITOR) 40 MG tablet Take 1 tablet by mouth once daily 90 tablet 0  . fluticasone (FLONASE) 50 MCG/ACT nasal spray Place 2 sprays into both nostrils daily. 16 g 1  . furosemide (LASIX) 20 MG tablet Take 1 tablet (20 mg total) by mouth daily. 30 tablet 5  . lisinopril (ZESTRIL) 20 MG tablet Take 1 tablet by mouth once daily 90 tablet 1  . polyethylene glycol powder (GLYCOLAX/MIRALAX) powder Take 17 g by mouth 2 (two) times daily as needed. (Patient taking differently: Take 17 g by mouth 2 (two) times daily as needed for mild constipation. ) 3350 g 1  . TIROSINT 50 MCG CAPS Take 1 capsule before breakfast daily 45  capsule 5  . Vitamin D, Cholecalciferol, 1000 units CAPS Take 2,000 Units by mouth daily.      No current facility-administered medications  on file prior to visit.   No Known Allergies Family History  Problem Relation Age of Onset  . Diabetes Sister   . Hypertension Sister   . Hypertension Brother   . Heart disease Brother    PE: BP (!) 150/100   Pulse 77   Ht 5\' 7"  (1.702 m)   Wt 194 lb 12.8 oz (88.4 kg)   SpO2 99%   BMI 30.51 kg/m  Wt Readings from Last 3 Encounters:  09/05/20 194 lb 12.8 oz (88.4 kg)  05/03/20 188 lb 6.4 oz (85.5 kg)  02/29/20 188 lb (85.3 kg)   Constitutional: overweight, in NAD Eyes: PERRLA, EOMI, no exophthalmos ENT: moist mucous membranes, no thyromegaly, no cervical lymphadenopathy Cardiovascular: RRR, No MRG Respiratory: CTA B Gastrointestinal: abdomen soft, NT, ND, BS+ Musculoskeletal: no deformities, strength intact in all 4 Skin: moist, warm, no rashes Neurological: no tremor with outstretched hands, DTR normal in all 4  Assessment: 1. Hypercalcemia/Primary hyperparathyroidism  2.  Osteopenia  3. Vit D def  4.  Thyroid nodule  Plan: 1. Patient with history of primary hyperparathyroidism with a previous calcium level being as high as 11.1 and a PTH as high as 142.  The hyperparathyroidism persisted even after normalization of her vitamin D.  Of note, she does have CKD which can raise the PTH, but not severe enough to cause this degree of hyperparathyroidism.  Investigation pointed towards a left inferior and right superior parathyroid adenomas.  Thyroid ultrasound showed a left inferior nodule, most consistent with a parathyroid adenoma and she had a thyroid nodule that was found incidentally at the time of her surgery.  This was benign.  She had 2 gland parathyroidectomy in 2019 and pathology showed that only the left inferior gland was adenomatous. -Her calcium and PTH levels normalized. -Most recent calcium level was normal in 04/2020, at  9.4  2.  Osteopenia -Reviewed her most recent DXA scan reports from 10/2016 and 05/25/2019: At the level of the spine, her T score is approximately the same (increased from -2.4 to -2.3), however, at the level of the femoral necks, her bone density increased by 10.8%*. -States she was in the osteopenic range, we discussed about following her without medications at last visit and repeat the bone scan this year -Of note, she was approved for Prolia in the past -She will need another bone density scan in 05/2021  3. Vit D def -Continues 2000 units vitamin D daily -Vitamin D level was normal, in 10/2019 and also in 04/2020.  Calcium level was also normal then, at 9 point -We will recheck her vitamin D level at next visit  4. Thyroid nodule -I reviewed the latest thyroid ultrasound report from 04/2017: Right-sided thyroid nodule, with the largest dimension 1.6 cm.  Biopsy of this nodule was inconclusive due to scant epithelial cells and we discussed about repeating the biopsy but she refused to have another biopsy due to the discomfort of the first one.  -She had right lobectomy at the time of her parathyroid surgery and the final pathology was benign  5.  Postsurgical hypothyroidism -Uncontrolled -At last visit, he reported hot flashes from levothyroxine 25 mcg daily.  We transiently decreased the dose to 12.5 mcg daily and then back to 25 mcg daily.  At last visit, TSH was about 10, so I suggested to take Tirosint since she was having itching and hot flashes after dosing solid levothyroxine.  She was also skipping doses and we discussed about the importance of  taking the medication every day.  Since then, she decided to take it every day, but she tells me that she did not receive my message about Tirosint (she mentions that she is not checking MyChart- we deactivated it today upon her request) and was not called by the pharmacy to pick it up.  Infarct, due to the hot flashes, she even decreased the  dose since then, to 12.5 mcg daily. - latest thyroid labs reviewed: Lab Results  Component Value Date   TSH 11.06 (H) 02/29/2020  -We discussed that 12.5 mcg daily is definitely not enough for her, since her TSH was elevated on 25 mcg before.  I again advised her to start [place] which I sent again to her pharmacy.  I did advise her to let me know if this is not covered, in which case, we can try brand-name Synthroid.  I explained that her side effects to the medication are likely not related to the active substance, but rather the excipients.  We also discussed about possible side effects of uncontrolled hypothyroidism. - we discussed about taking the thyroid hormone every day, with water, >30 minutes before breakfast, separated by >4 hours from acid reflux medications, calcium, iron, multivitamins. Pt. is taking it correctly. - will check thyroid tests in 5 to 6 weeks after starting Tirosint and increasing the dose: TSH and fT4  [name], MD PhD [facility] Endocrinology

## 2020-09-05 NOTE — Patient Instructions (Signed)
Please switch to Tirosint 50 mcg daily.  Take the thyroid hormone every day, with water, at least 30 minutes before breakfast, separated by at least 4 hours from: - acid reflux medications - calcium - iron - multivitamins  Please come back for labs in 1.5 months after starting Tirosint.  Continue vitamin D 2000 units daily.  Please come back for a follow-up appointment in 3-4 months.

## 2020-09-12 ENCOUNTER — Telehealth: Payer: Self-pay | Admitting: Internal Medicine

## 2020-09-12 DIAGNOSIS — E210 Primary hyperparathyroidism: Secondary | ICD-10-CM

## 2020-09-12 MED ORDER — SYNTHROID 50 MCG PO TABS: 50.0000 ug | ORAL_TABLET | Freq: Every day | ORAL | 1 refills | Status: DC

## 2020-09-12 NOTE — Telephone Encounter (Signed)
Rx sent to preferred pharmacy as DAW.

## 2020-09-12 NOTE — Telephone Encounter (Signed)
Let's send Synthroid DAW instead. If not covered, we will need to continue with generic Levothyroxine.

## 2020-09-12 NOTE — Telephone Encounter (Signed)
Please advise 

## 2020-09-12 NOTE — Telephone Encounter (Signed)
Pt called regarding her TIROSINT 50MG  tablets. Pt states her insurance does not cover it and was wondering if the Dr could prescribe an alternative that can be covered.   PHARMACY:   [facility] ([place]), [place] - [address] Phone:  [phone]  Fax:  [phone]

## 2020-10-05 ENCOUNTER — Telehealth: Payer: Self-pay | Admitting: Internal Medicine

## 2020-10-05 MED ORDER — FUROSEMIDE 20 MG PO TABS: 20.0000 mg | ORAL_TABLET | Freq: Every day | ORAL | 5 refills | Status: DC

## 2020-10-05 NOTE — Telephone Encounter (Signed)
done

## 2020-10-05 NOTE — Telephone Encounter (Signed)
Refill request for lasix 20mg  to [facility]

## 2020-10-05 NOTE — Telephone Encounter (Signed)
Ok

## 2020-10-30 NOTE — Progress Notes (Signed)
Tracy Rowe is a 75 y.o. female who presents for annual wellness visit, CPE and follow-up on chronic medical conditions.  She has the following concerns:  HTN- taking medications daily  Does not check BP at home.  Diet fairly high in salt.   HL- taking statin and no concerns  Husband with HIV- screen today  Slightly tender mass on left upper back, present for 3 months. No pain  Denies leg pain with walking. Occasional "tingle or burning sensation" in the left toes. Brief episodes and not often. Little toe more often. No injury.  She would like to see a foot specialist.   Sees Dr. Cruzita Rowe for thyroid, vitamin D and osteoporosis     Immunization History  Administered Date(s) Administered  . Fluad Quad(high Dose 65+) 05/03/2020  . Influenza, High Dose Seasonal PF 10/23/2016, 05/22/2017, 06/14/2018, 05/18/2019  . Influenza-Unspecified 05/18/2019  . PFIZER(Purple Top)SARS-COV-2 Vaccination 10/22/2019, 11/19/2019, 07/02/2020  . Pneumococcal Conjugate-13 10/23/2016  . Pneumococcal Polysaccharide-23 10/29/2018  . Tdap 10/30/2016   Last Pap smear:aged out  Last mammogram: 01/24/20 Last colonoscopy: 06/05/20 cologuard Last DEXA: 05/25/19 Dentist: over two years ago Ophtho: over two years  Exercise: walking in yard 20 min   Other doctors caring for patient include:  Dr. Cruzita Rowe -Endocrinologist    Depression screen:  See questionnaire below.  Depression screen [facility] 2/9 10/31/2020 05/03/2020 10/31/2019 06/29/2019 10/29/2018  Decreased Interest 0 0 0 0 0  Down, Depressed, Hopeless 0 0 0 0 0  PHQ - 2 Score 0 0 0 0 0    Fall Risk Screen: see questionnaire below. Fall Risk  10/31/2020 05/03/2020 10/31/2019 06/29/2019 10/29/2018  Falls in the past year? 0 0 0 0 0  Number falls in past yr: 0 0 0 0 0  Injury with Fall? 0 0 0 0 0  Risk for fall due to : No Fall Risks - - - -  Follow up Falls evaluation completed - - - -    ADL screen:  See questionnaire below Functional Status Survey: Is the  patient deaf or have difficulty hearing?: No Does the patient have difficulty seeing, even when wearing glasses/contacts?: No Does the patient have difficulty concentrating, remembering, or making decisions?: No Does the patient have difficulty walking or climbing stairs?: No Does the patient have difficulty dressing or bathing?: No Does the patient have difficulty doing errands alone such as visiting a doctor's office or shopping?: No   End of Life Discussion:  Patient has a living will and medical power of attorney. MOST form reviewed and signed. No changes.   Review of Systems Constitutional: -fever, -chills, -sweats, -unexpected weight change, -anorexia, -fatigue Allergy: -sneezing, -itching, -congestion Dermatology: denies changing moles, rash, +lumps, new worrisome lesions ENT: -runny nose, -ear pain, -sore throat, -hoarseness, -sinus pain, -teeth pain, -tinnitus, -hearing loss, -epistaxis Cardiology:  -chest pain, -palpitations, -edema, -orthopnea, -paroxysmal nocturnal dyspnea Respiratory: -cough, -shortness of breath, -dyspnea on exertion, -wheezing, -hemoptysis Gastroenterology: -abdominal pain, -nausea, -vomiting, -diarrhea, -constipation, -blood in stool, -changes in bowel movement, -dysphagia Hematology: -bleeding or bruising problems Musculoskeletal: -arthralgias, -myalgias, -joint swelling, -back pain, -neck pain, -cramping, -gait changes Ophthalmology: -vision changes, -eye redness, -itching, -discharge Urology: -dysuria, -difficulty urinating, -hematuria, -urinary frequency, -urgency,- incontinence Neurology: -headache, -weakness, -tingling, + (toes)numbness, -speech abnormality, -memory loss, -falls, -dizziness Psychology:  -depressed mood, -agitation, -sleep problems    PHYSICAL EXAM:  BP (!) 144/84   Pulse 68   Temp 98 F (36.7 C)   Ht 5\' 7"  (1.702 m)   Wt  192 lb 6.4 oz (87.3 kg)   SpO2 98%   BMI 30.13 kg/m   General Appearance: Alert, cooperative, no  distress, appears stated age Head: Normocephalic, without obvious abnormality, atraumatic Eyes: PERRL, conjunctiva/corneas clear, EOM's intact Ears: Normal TM's and external ear canals Nose: mask on  Throat: mask on  Neck: Supple, no lymphadenopathy; thyroid: no enlargement/tenderness/nodules; no JVD Back: Spine nontender, no curvature, ROM normal, no CVA tenderness Lungs: Clear to auscultation bilaterally without wheezes, rales or ronchi; respirations unlabored Chest Wall: No tenderness or deformity Heart: Regular rate and rhythm, S1 and S2 normal, no murmur, rub or gallop Breast Exam: No tenderness, masses, or nipple discharge or inversion. No axillary lymphadenopathy Abdomen: Soft, non-tender, nondistended, normoactive bowel sounds, no masses, no hepatosplenomegaly Genitalia: declines  Extremities: No clubbing, cyanosis or edema Pulses: 2+ and symmetric all extremities Skin: Skin color, texture, turgor normal, no rashes or lesions. Round, raised, firm cyst on left upper back without fluctuance or TTP Lymph nodes: Cervical, supraclavicular, and axillary nodes normal Neurologic: CNII-XII intact, normal strength, sensation and gait; reflexes 2+ and symmetric throughout Psych: Normal mood, affect, hygiene and grooming.  ASSESSMENT/PLAN: Medicare annual wellness visit, subsequent -Here for Medicare wellness visit.  Denies any issues with memory, mood, ADLs and no falls.  Medications reviewed.  Advanced directive counseling done.  Routine general medical examination at a health care facility - Plan: CBC with Differential/Platelet, Comprehensive metabolic panel -Preventive health care reviewed.  Counseled on healthy lifestyle including diet exercise.  Immunizations reviewed.  Recommend regular dental and eye exams.  Discussed that her vision is decreased so she should schedule an eye exam soon.  Discussed safety and health promotion.  Aortic atherosclerosis ([place]) - Plan: Lipid  panel -Continue statin therapy and low-fat diet.  I recommend she increase her physical activity  Mixed hyperlipidemia - Plan: Lipid panel -Continue statin therapy and low-fat diet.  I recommend she increase her physical activity  Primary hypertension - Plan: CBC with Differential/Platelet, Comprehensive metabolic panel -Blood pressure elevated today.  Continue current medication.  I recommend she cut back on sodium.  She will start to check her blood pressure at home.  Return in 4 weeks for follow-up on hypertension  Cyst of skin -Use warm compresses.  If this worsens she may need an I&D.  Numbness of toes - Plan: Ambulatory referral to Podiatry -Bilateral lower extremities are neurovascularly intact.  ABIs normal in 2019.  Discussed options and she would prefer to be referred to podiatry at this time.  Encounter for breast cancer screening using non-mammogram modality  Advance directive discussed with patient  Elevated serum creatinine - Plan: CBC with Differential/Platelet, Comprehensive metabolic panel -Continue to monitor.  Discussed importance of good blood pressure control.  At risk for sexually transmitted disease due to partner with HIV - Plan: HIV Antibody (routine testing w rflx) -Follow-up pending results     Discussed monthly self breast exams and yearly mammograms; at least 30 minutes of aerobic activity at least 5 days/week and weight-bearing exercise 2x/week; proper sunscreen use reviewed; healthy diet, including goals of calcium and vitamin D intake and alcohol recommendations (less than or equal to 1 drink/day) reviewed; regular seatbelt use; changing batteries in smoke detectors.  Immunization recommendations discussed.  Colonoscopy recommendations reviewed   Medicare Attestation I have personally reviewed: The patient's medical and social history Their use of alcohol, tobacco or illicit drugs Their current medications and supplements The patient's functional  ability including ADLs,fall risks, home safety risks, cognitive, and hearing and  visual impairment Diet and physical activities Evidence for depression or mood disorders  The patient's weight, height, and BMI have been recorded in the chart.  I have made referrals, counseling, and provided education to the patient based on review of the above and I have provided the patient with a written personalized care plan for preventive services.     Harland Dingwall, NP-C   10/31/2020

## 2020-10-31 ENCOUNTER — Other Ambulatory Visit: Payer: Self-pay

## 2020-10-31 ENCOUNTER — Telehealth: Payer: Self-pay | Admitting: Family Medicine

## 2020-10-31 ENCOUNTER — Encounter: Payer: Self-pay | Admitting: Family Medicine

## 2020-10-31 ENCOUNTER — Ambulatory Visit: Payer: Medicare PPO | Admitting: Family Medicine

## 2020-10-31 VITALS — BP 144/84 | HR 68 | Temp 98.0°F | Ht 67.0 in | Wt 192.4 lb

## 2020-10-31 DIAGNOSIS — R200 Anesthesia of skin: Secondary | ICD-10-CM | POA: Diagnosis not present

## 2020-10-31 DIAGNOSIS — Z0000 Encounter for general adult medical examination without abnormal findings: Secondary | ICD-10-CM

## 2020-10-31 DIAGNOSIS — L729 Follicular cyst of the skin and subcutaneous tissue, unspecified: Secondary | ICD-10-CM | POA: Diagnosis not present

## 2020-10-31 DIAGNOSIS — I10 Essential (primary) hypertension: Secondary | ICD-10-CM

## 2020-10-31 DIAGNOSIS — Z9189 Other specified personal risk factors, not elsewhere classified: Secondary | ICD-10-CM | POA: Diagnosis not present

## 2020-10-31 DIAGNOSIS — R7989 Other specified abnormal findings of blood chemistry: Secondary | ICD-10-CM

## 2020-10-31 DIAGNOSIS — E782 Mixed hyperlipidemia: Secondary | ICD-10-CM | POA: Diagnosis not present

## 2020-10-31 DIAGNOSIS — I700 Atherosclerosis of aorta: Secondary | ICD-10-CM

## 2020-10-31 DIAGNOSIS — Z7189 Other specified counseling: Secondary | ICD-10-CM

## 2020-10-31 DIAGNOSIS — Z1239 Encounter for other screening for malignant neoplasm of breast: Secondary | ICD-10-CM

## 2020-10-31 MED ORDER — ATORVASTATIN CALCIUM 40 MG PO TABS: 40.0000 mg | ORAL_TABLET | Freq: Every day | ORAL | 1 refills | Status: DC

## 2020-10-31 MED ORDER — LISINOPRIL 20 MG PO TABS: 20.0000 mg | ORAL_TABLET | Freq: Every day | ORAL | 1 refills | Status: DC

## 2020-10-31 NOTE — Telephone Encounter (Signed)
Pt needs refill on Lisinopril and Atorvastatin sent to the [facility] on [address]

## 2020-10-31 NOTE — Telephone Encounter (Signed)
Done KH 

## 2020-10-31 NOTE — Telephone Encounter (Signed)
Please give her 6 mnths

## 2020-10-31 NOTE — Patient Instructions (Addendum)
  [name] , Thank you for taking time to come for your Medicare Wellness Visit. I appreciate your ongoing commitment to your health goals. Please review the following plan we discussed and let me know if I can assist you in the future.   These are the goals we discussed:  Make sure you are watching your salt intake and limiting this.  Check your blood pressure at home and let me know if it is staying higher than 130/80.   If the cyst on your upper back gets any larger or more painful, return to see Tracy Rowe for a procedure.  Use warm compresses on it for now.   You will hear from [facility] about your foot issue.   Increase your walking or exercise.   I will be in touch with your results.     This is a list of the screening recommended for you and due dates:  Health Maintenance  Topic Date Due  . Tetanus Vaccine  10/31/2026  . Flu Shot  Completed  . DEXA scan (bone density measurement)  Completed  . COVID-19 Vaccine  Completed  .  Hepatitis C: One time screening is recommended by Center for Disease Control  (CDC) for  adults born from [birth year] through 1967.   Completed  . Pneumonia vaccines  Completed  . HPV Vaccine  Aged Out

## 2020-11-01 ENCOUNTER — Other Ambulatory Visit: Payer: Self-pay

## 2020-11-01 DIAGNOSIS — E870 Hyperosmolality and hypernatremia: Secondary | ICD-10-CM

## 2020-11-01 LAB — COMPREHENSIVE METABOLIC PANEL
ALT: 10 IU/L (ref 0–32)
AST: 17 IU/L (ref 0–40)
Albumin/Globulin Ratio: 1.4 (ref 1.2–2.2)
Albumin: 4.3 g/dL (ref 3.7–4.7)
Alkaline Phosphatase: 77 IU/L (ref 44–121)
BUN/Creatinine Ratio: 11 — ABNORMAL LOW (ref 12–28)
BUN: 12 mg/dL (ref 8–27)
Bilirubin Total: 1 mg/dL (ref 0.0–1.2)
CO2: 24 mmol/L (ref 20–29)
Calcium: 9.5 mg/dL (ref 8.7–10.3)
Chloride: 107 mmol/L — ABNORMAL HIGH (ref 96–106)
Creatinine, Ser: 1.14 mg/dL — ABNORMAL HIGH (ref 0.57–1.00)
Globulin, Total: 3.1 g/dL (ref 1.5–4.5)
Glucose: 94 mg/dL (ref 65–99)
Potassium: 4.2 mmol/L (ref 3.5–5.2)
Sodium: 148 mmol/L — ABNORMAL HIGH (ref 134–144)
Total Protein: 7.4 g/dL (ref 6.0–8.5)
eGFR: 50 mL/min/{1.73_m2} — ABNORMAL LOW (ref >59)

## 2020-11-01 LAB — CBC WITH DIFFERENTIAL/PLATELET
Basophils Absolute: 0 10*3/uL (ref 0.0–0.2)
Basos: 1 %
EOS (ABSOLUTE): 0.1 10*3/uL (ref 0.0–0.4)
Eos: 2 %
Hematocrit: 41.9 % (ref 34.0–46.6)
Hemoglobin: 13.8 g/dL (ref 11.1–15.9)
Immature Grans (Abs): 0 10*3/uL (ref 0.0–0.1)
Immature Granulocytes: 0 %
Lymphocytes Absolute: 1.5 10*3/uL (ref 0.7–3.1)
Lymphs: 29 %
MCH: 27.4 pg (ref 26.6–33.0)
MCHC: 32.9 g/dL (ref 31.5–35.7)
MCV: 83 fL (ref 79–97)
Monocytes Absolute: 0.4 10*3/uL (ref 0.1–0.9)
Monocytes: 7 %
Neutrophils Absolute: 3.2 10*3/uL (ref 1.4–7.0)
Neutrophils: 61 %
Platelets: 196 10*3/uL (ref 150–450)
RBC: 5.04 x10E6/uL (ref 3.77–5.28)
RDW: 13.1 % (ref 11.7–15.4)
WBC: 5.1 10*3/uL (ref 3.4–10.8)

## 2020-11-01 LAB — LIPID PANEL
Chol/HDL Ratio: 3.4 ratio (ref 0.0–4.4)
Cholesterol, Total: 152 mg/dL (ref 100–199)
HDL: 45 mg/dL (ref >39)
LDL Chol Calc (NIH): 89 mg/dL (ref 0–99)
Triglycerides: 95 mg/dL (ref 0–149)
VLDL Cholesterol Cal: 18 mg/dL (ref 5–40)

## 2020-11-01 LAB — HIV ANTIBODY (ROUTINE TESTING W REFLEX): HIV Screen 4th Generation wRfx: NONREACTIVE

## 2020-11-01 NOTE — Progress Notes (Signed)
Her kidney function is stable.  Her sodium level is a little higher than it has been so I would like to recheck this next week.  Please schedule her to return for a lab visit and order a BMP for hypernatremia.  I would like for her to make sure she is drinking plenty of water throughout the day and staying well-hydrated. Otherwise her blood work is fine

## 2020-11-08 ENCOUNTER — Other Ambulatory Visit: Payer: Medicare PPO

## 2020-11-08 ENCOUNTER — Other Ambulatory Visit: Payer: Self-pay

## 2020-11-08 ENCOUNTER — Telehealth: Payer: Self-pay | Admitting: Internal Medicine

## 2020-11-08 DIAGNOSIS — E870 Hyperosmolality and hypernatremia: Secondary | ICD-10-CM | POA: Diagnosis not present

## 2020-11-08 DIAGNOSIS — E210 Primary hyperparathyroidism: Secondary | ICD-10-CM

## 2020-11-08 NOTE — Telephone Encounter (Signed)
Pt came to the office to advise that she would like from now on to order "Levothyroxine" instead of her Synthroid.  MEDICATION: levothyroxine  PHARMACY:   [facility] ([place]), [place] - [address] Phone:  [phone]  Fax:  [phone]       HAS THE PATIENT CONTACTED THEIR PHARMACY?  no  IS THIS A 90 DAY SUPPLY : yes  IS PATIENT OUT OF MEDICATION: no  IF NOT; HOW MUCH IS LEFT: 5 tabs  LAST APPOINTMENT DATE: @1 /19/2022  NEXT APPOINTMENT DATE:@5 /07/2021  DO WE HAVE YOUR PERMISSION TO LEAVE A DETAILED MESSAGE?:  OTHER COMMENTS:    **Let patient know to contact pharmacy at the end of the day to make sure medication is ready. **  ** Please notify patient to allow 48-72 hours to process**  **Encourage patient to contact the pharmacy for refills or they can request refills through [facility]**

## 2020-11-09 LAB — BASIC METABOLIC PANEL
BUN/Creatinine Ratio: 10 — ABNORMAL LOW (ref 12–28)
BUN: 12 mg/dL (ref 8–27)
CO2: 21 mmol/L (ref 20–29)
Calcium: 9.3 mg/dL (ref 8.7–10.3)
Chloride: 106 mmol/L (ref 96–106)
Creatinine, Ser: 1.2 mg/dL — ABNORMAL HIGH (ref 0.57–1.00)
Glucose: 81 mg/dL (ref 65–99)
Potassium: 3.9 mmol/L (ref 3.5–5.2)
Sodium: 145 mmol/L — ABNORMAL HIGH (ref 134–144)
eGFR: 47 mL/min/{1.73_m2} — ABNORMAL LOW (ref >59)

## 2020-11-09 NOTE — Progress Notes (Signed)
Her sodium level is ok now. Seems like she is at baseline for her.

## 2020-11-10 MED ORDER — LEVOTHYROXINE SODIUM 50 MCG PO TABS: 50.0000 ug | ORAL_TABLET | Freq: Every day | ORAL | 1 refills | Status: DC

## 2020-11-10 NOTE — Telephone Encounter (Signed)
Removed DAW from previously sent rx.

## 2020-11-28 ENCOUNTER — Encounter: Payer: Self-pay | Admitting: Family Medicine

## 2020-11-28 ENCOUNTER — Ambulatory Visit: Payer: Medicare PPO | Admitting: Family Medicine

## 2020-11-28 ENCOUNTER — Other Ambulatory Visit: Payer: Self-pay

## 2020-11-28 VITALS — BP 150/88 | HR 94 | Wt 193.2 lb

## 2020-11-28 DIAGNOSIS — I10 Essential (primary) hypertension: Secondary | ICD-10-CM | POA: Diagnosis not present

## 2020-11-28 DIAGNOSIS — G4700 Insomnia, unspecified: Secondary | ICD-10-CM

## 2020-11-28 MED ORDER — LISINOPRIL 40 MG PO TABS: 40.0000 mg | ORAL_TABLET | Freq: Every day | ORAL | 3 refills | Status: DC

## 2020-11-28 NOTE — Progress Notes (Signed)
   Subjective:    Patient ID: Tracy Rowe, female    DOB: June 10, 1946, 75 y.o.   MRN: [id number]  HPI Chief Complaint  Patient presents with  . follow-up    Follow-up on bp. Still running high. Around 154/88.    She is here for a 4-week follow-up on hypertension.   She has been checking her blood pressure at home and reports readings consistently in the 150s/upper 80s. Reports cutting back on sodium and being more active since our last visit.  Currently taking lisinopril 20 mg daily and furosemide 20 mg daily.  Denies history of sleep apnea.  States she does not snore. Reports dozing during the day but no naps.  Reports restorative sleep.   Would like to try melatonin to help her fall asleep easier.   Denies fever, chills, dizziness, chest pain, palpitations, shortness of breath, abdominal pain, nausea, vomiting, LE edema.  Reviewed allergies, medications, past medical, surgical, family, and social history.    Review of Systems Pertinent positives and negatives in the history of present illness.     Objective:   Physical Exam BP (!) 150/88   Pulse 94   Wt 193 lb 3.2 oz (87.6 kg)   BMI 30.26 kg/m   Alert and in no distress.  Cardiac exam shows a regular sinus rhythm without murmurs or gallops. Lungs are clear to auscultation.  Extremities without edema.  Skin is warm and dry.       Assessment & Plan:  Primary hypertension - Plan: lisinopril (ZESTRIL) 40 MG tablet  Insomnia, unspecified type  Epworth sleepiness scale score 4. No sign of OSA.  Discussed that her blood pressures at home and here in the office today are still much higher than recommended.  Continue eating a low-sodium diet and being physically active. I will increase her lisinopril to 40 mg daily and she will continue on furosemide 20 mg daily for now. She may take melatonin as needed for sleep and let me know if this is not helping.  Follow-up virtually or in office in 4 to 6 weeks.

## 2020-11-28 NOTE — Patient Instructions (Signed)
I am increasing your lisinopril to 40 mg daily. Continue on your other medications.  Continue low sodium diet and get at least 150 minutes of physical activity per week.

## 2020-11-29 ENCOUNTER — Ambulatory Visit: Payer: Medicare PPO | Admitting: Sports Medicine

## 2020-11-29 DIAGNOSIS — M79675 Pain in left toe(s): Secondary | ICD-10-CM

## 2020-11-29 DIAGNOSIS — M792 Neuralgia and neuritis, unspecified: Secondary | ICD-10-CM

## 2020-11-29 DIAGNOSIS — M79674 Pain in right toe(s): Secondary | ICD-10-CM

## 2020-11-29 NOTE — Progress Notes (Signed)
Subjective: Tracy Rowe is a 75 y.o. female patient who presents to office for evaluation of numbness at the left greater than right fifth toe reports that it goes numb when she sits on the toilet or while reading or at night when she is lying down.  Does not notice any pain or decrease in sensation during the day but states that over the last month she has noticed this happening.  Patient denies any redness swelling drainage warmth or any acute symptoms at this time to bilateral feet.  Review of system noncontributory.  Patient Active Problem List   Diagnosis Date Noted  . Aortic atherosclerosis ([place]) 06/28/2019  . Elevated serum creatinine 10/29/2018  . Primary hyperparathyroidism ([place]) 09/03/2017  . Thyroid nodule 08/14/2017  . Hyperlipidemia   . Osteopenia 01/12/2017  . Vitamin D deficiency 10/24/2016  . Hypertension 09/25/2016    Current Outpatient Medications on File Prior to Visit  Medication Sig Dispense Refill  . atorvastatin (LIPITOR) 40 MG tablet Take 1 tablet (40 mg total) by mouth daily. 90 tablet 1  . fluticasone (FLONASE) 50 MCG/ACT nasal spray Place 2 sprays into both nostrils daily. 16 g 1  . furosemide (LASIX) 20 MG tablet Take 1 tablet (20 mg total) by mouth daily. 30 tablet 5  . levothyroxine (SYNTHROID) 50 MCG tablet Take 1 tablet (50 mcg total) by mouth daily before breakfast. 90 tablet 1  . lisinopril (ZESTRIL) 40 MG tablet Take 1 tablet (40 mg total) by mouth daily. 90 tablet 3  . polyethylene glycol powder (GLYCOLAX/MIRALAX) powder Take 17 g by mouth 2 (two) times daily as needed. (Patient taking differently: Take 17 g by mouth 2 (two) times daily as needed for mild constipation.) 3350 g 1  . Vitamin D, Cholecalciferol, 1000 units CAPS Take 2,000 Units by mouth daily.      No current facility-administered medications on file prior to visit.    No Known Allergies  Objective:  General: Alert and oriented x3 in no acute distress  Dermatology: No open  lesions bilateral lower extremities, no webspace macerations, no ecchymosis bilateral, all nails x 10 are well manicured.  Vascular: Dorsalis Pedis and Posterior Tibial pedal pulses palpable, Capillary Fill Time 3 seconds,(+) pedal hair growth bilateral, no edema bilateral lower extremities, Temperature gradient within normal limits.  Neurology: Johney Maine sensation intact via light touch bilateral, vibratory sensation intact bilateral.  Thornell Mule intact bilateral.  Musculoskeletal: No reproducible tenderness to palpation bilateral.  Patient has mild hammertoe deformity noted bilateral.  Assessment and Plan: Problem List Items Addressed This Visit   None   Visit Diagnoses    Neuritis    -  Primary   Toe pain, bilateral           -Complete examination performed -Discussed with patient symptoms that are likely of neuritis which is likely secondary to compression when she is sitting in a certain position advised patient that we will closely monitor if symptoms worsen she may need referral for neurology -Patient to return to office as needed or sooner if condition worsens.  Landis Martins, DPM

## 2020-12-04 ENCOUNTER — Encounter: Payer: Self-pay | Admitting: Internal Medicine

## 2021-01-02 ENCOUNTER — Ambulatory Visit: Payer: Medicare PPO | Admitting: Family Medicine

## 2021-01-02 ENCOUNTER — Encounter: Payer: Self-pay | Admitting: Family Medicine

## 2021-01-02 VITALS — BP 138/74 | HR 76 | Wt 191.6 lb

## 2021-01-02 DIAGNOSIS — I10 Essential (primary) hypertension: Secondary | ICD-10-CM | POA: Diagnosis not present

## 2021-01-02 NOTE — Progress Notes (Signed)
   Subjective:    Patient ID: Tracy Rowe, female    DOB: April 24, 1946, 74 y.o.   MRN: [id number]  HPI Chief Complaint  Patient presents with  . follow-up    Follow-up on HTN   She is here to follow up on HTN. She has been checking her BP at home and cutting back on sodium.  Readings at home have been fluctuating between goal and 150s over upper 80s. She brought in her blood pressure machine today to compare against ours and her machine is reading 11 points higher systolically  At her last visit we increased her lisinopril from 20 mg to 40 mg.  She denies any side effects.  Denies fever, chills, dizziness, chest pain, palpitations, shortness of breath, abdominal pain, N/V/D, urinary symptoms, LE edema.    Review of Systems Pertinent positives and negatives in the history of present illness.     Objective:   Physical Exam BP 138/74   Pulse 76   Wt 191 lb 9.6 oz (86.9 kg)   BMI 30.01 kg/m   Alert and oriented in no acute distress.  Respirations unlabored.  Otherwise examined.      Assessment & Plan:  Primary hypertension  Blood pressure has responded well to increased dose of lisinopril.  She is also taking furosemide daily.  Recent kidney function stable.  Continue low-sodium diet and on current medication regimen. She has an appointment tomorrow with Dr. Cruzita Lederer.

## 2021-01-03 ENCOUNTER — Other Ambulatory Visit: Payer: Self-pay

## 2021-01-03 ENCOUNTER — Encounter: Payer: Self-pay | Admitting: Internal Medicine

## 2021-01-03 ENCOUNTER — Ambulatory Visit: Payer: Medicare PPO | Admitting: Internal Medicine

## 2021-01-03 VITALS — BP 150/100 | HR 77 | Ht 67.0 in | Wt 191.0 lb

## 2021-01-03 DIAGNOSIS — E041 Nontoxic single thyroid nodule: Secondary | ICD-10-CM

## 2021-01-03 DIAGNOSIS — E890 Postprocedural hypothyroidism: Secondary | ICD-10-CM | POA: Diagnosis not present

## 2021-01-03 DIAGNOSIS — E559 Vitamin D deficiency, unspecified: Secondary | ICD-10-CM | POA: Diagnosis not present

## 2021-01-03 DIAGNOSIS — M8580 Other specified disorders of bone density and structure, unspecified site: Secondary | ICD-10-CM

## 2021-01-03 DIAGNOSIS — E210 Primary hyperparathyroidism: Secondary | ICD-10-CM | POA: Diagnosis not present

## 2021-01-03 LAB — TSH: TSH: 0.47 u[IU]/mL (ref 0.35–4.50)

## 2021-01-03 LAB — T4, FREE: Free T4: 1.16 ng/dL (ref 0.60–1.60)

## 2021-01-03 NOTE — Progress Notes (Signed)
Patient ID: Tracy Rowe, female   DOB: 1946-02-07, 75 y.o.   MRN: [id number]   This visit occurred during the SARS-CoV-2 public health emergency.  Safety protocols were in place, including screening questions prior to the visit, additional usage of staff PPE, and extensive cleaning of exam room while observing appropriate contact time as indicated for disinfecting solutions.   HPI  Tracy Rowe is a 75 y.o. female, returning for follow-up for h/o primary hyperparathyroidism, vitamin D deficiency, osteopenia, thyroid nodule, and now postsurgical hypothyroidism diagnosed after last visit. Last visit 4 months ago.  Interim history: No falls or fractures since last visit. No complaints at this visit other than hot flushes, which are improved.  Primary hyperparathyroidism: She was diagnosed with hypercalcemia in 09/2016 but she had a technetium sestamibi scan that was positive for parathyroid adenoma in 2002.  Reviewed pertinent labs: Lab Results  Component Value Date   PTH 52 12/23/2017   PTH Comment 12/23/2017   PTH 95 (H) 08/14/2017   PTH Comment 08/14/2017   PTH 55 03/02/2017   PTH 142 (H) 11/26/2016   CALCIUM 9.3 11/08/2020   CALCIUM 9.5 10/31/2020   CALCIUM 9.4 05/03/2020   CALCIUM 9.5 12/06/2019   CALCIUM 9.7 11/10/2019   CALCIUM 9.2 10/31/2019   CALCIUM 9.4 06/29/2019   CALCIUM 9.2 10/29/2018   CALCIUM 9.3 05/07/2018   CALCIUM 9.5 05/03/2018   She had an indeterminate Tc sestamibi scan (2002): NO DEFINITE PARATHYROID ADENOPATHY LOCALIZED IN THE NECK OR CHEST ALTHOUGH THERE IS A FOCUS OF MILDLY PROMINENT ACTIVITY AT THE INFERIOR POLE OF THE LEFT LOBE OF THE THYROID WHICH WOULD BE THE MOST LIKELY SITE ON THIS SCAN.  Other labs reviewed: Component     Latest Ref Rng & Units 03/02/2017  Vitamin D 1, 25 (OH) Total     18 - 72 pg/mL 68  Vitamin D3 1, 25 (OH)     pg/mL 44  Vitamin D2 1, 25 (OH)     pg/mL 24  Phosphorus     2.3 - 4.6 mg/dL 2.4  Magnesium     1.5 - 2.5  mg/dL 1.8   Component     Latest Ref Rng & Units 03/04/2017  Creatinine, Urine     20 - 320 mg/dL 63  Creatinine, 24H Ur     0.63 - 2.50 g/24 h 0.50 (L)  Calcium, Ur     Not estab mg/dL 3  Calcium, 24 hour urine     35 - 250 mg/24 h 24 (L)  Urinary calcium is low, but the creatinine was also low.  I did not ask her to repeat a collection, since even a higher creatinine level will most likely not push her over the limit for hypercalciuria.    She had the following investigations:  Technetium sestamibi parathyroid scan (05/05/2017): Left inferior parathyroid adenoma and a possible right superior parathyroid adenoma versus thyroid nodule.   Thyroid ultrasound (05/20/2017): Several nodules, of which one right superior measuring 1.6 x 1.3 x 1.3, solid, hypoechoic; another left inferior 1.9 x 1.3 x 1.3 cm, mixed, hypoechoic, presumed to be an enlarged parathyroid gland  FNA of the 1.6 cm nodule (07/08/2017): Scant epithelium (Bethesda category 1)  She had parathyroidectomy + right lobectomy on 09/03/2017.  Left inferior parathyroid adenoma was resected.  The right superior parathyroid did not contain an adenoma.  R Thyroid nodule was benign.  Diagnosis 1. Parathyroid gland, left inferior - HYPERCELLULAR PARATHYROID TISSUE CONSISTENT WITH ADENOMA 2. Parathyroid gland, right  superior - PARATHYROID TISSUE 3. Thyroid, lobectomy, right lobe - BENIGN FOLLICULAR ADENOMA (1.2 CM) The right thyroid lobe has an adenomatous lesion which has a very thin fibrous capsule consistent with follicular adenoma. There is no evidence of capsular or vascular invasion. The surrounding thyroid parenchyma is unremarkable.  Calcium normalized after surgery.  Osteopenia: Reviewed previous DXA scan reports: Date L1-4 T score FN T score  05/25/2019  L2, L3: -2.3 (+1.3%) RFN: -2.1 LFN: -1.9 Overall: +10.8%*  11/19/2016 L2, L3:-2.4 RFN: -2.3 LFN: -2.3  08/09/2007 L1, L3, L4: -2.0 RFN: n/a LFN: -1.8  Per  review of the chart, she was approved for Prolia.  No history of kidney stones.  + Mild CKD.  Reviewed BUN/Cr: Lab Results  Component Value Date   BUN 12 11/08/2020   BUN 12 10/31/2020   BUN 15 05/03/2020   BUN 11 12/06/2019   BUN 13 11/10/2019   BUN 12 10/31/2019   BUN 14 06/29/2019   BUN 11 10/29/2018   BUN 18 05/07/2018   BUN 14 05/03/2018   Lab Results  Component Value Date   CREATININE 1.20 (H) 11/08/2020   CREATININE 1.14 (H) 10/31/2020   CREATININE 1.14 (H) 05/03/2020   CREATININE 1.10 (H) 12/06/2019   CREATININE 1.14 (H) 11/10/2019   CREATININE 1.11 (H) 10/31/2019   CREATININE 1.23 (H) 06/29/2019   CREATININE 1.16 (H) 10/29/2018   CREATININE 1.33 (H) 05/07/2018   CREATININE 1.28 (H) 05/03/2018   She was previously on HCTZ in the past but we changed to Lasix during investigation for hyperparathyroidism.  History of vitamin D deficiency:  Reviewed her vitamin D levels: Lab Results  Component Value Date   VD25OH 37.8 05/03/2020   VD25OH 44.6 10/31/2019   VD25OH 37.23 03/01/2019   VD25OH 39.38 12/23/2017   VD25OH 38.66 08/14/2017   VD25OH 26.51 (L) 05/15/2017   VD25OH 31.30 03/02/2017   VD25OH 30 12/29/2016   VD25OH 10 (L) 10/23/2016   She takes 2000 units of vitamin D daily.  She was previously on ergocalciferol.  Pt does not have a FH of osteoporosis.  She also has HTN.   Postop hypothyroidism  -Developed after her right hemithyroidectomy in 2019  In 02/2019 I advised her to start levothyroxine 25 mcg daily.  She develops hot flashes and stopped.  In 10/2019, TSH was still high so I advised her to restart the lower dose of levothyroxine. She started, but she again developed hot flashes and itching right after taking the levothyroxine tablet.  We switched to Tirosint (liquid levothyroxine) and I advised her to increase the dose to 50 mcg daily.  However, at last visit, she was only taking 12.5 mcg.  I advised her to increase the dose to 50 mcg daily.   Since then, we had to stop Tirosint due to insurance coverage and switch to Synthroid d.a.w. . However, this is &$$$ >> would like to switch to generic.  She takes the Synthroid: -Was previously missing 2 days a week, now takes it daily - in am - fasting - at least 30 min from b'fast - no calcium - no iron - no multivitamins - no PPIs - not on Biotin  Reviewed her TFTs: Lab Results  Component Value Date   TSH 11.06 (H) 02/29/2020   TSH 10.100 (H) 10/31/2019   TSH 11.65 (H) 03/01/2019   TSH 4.42 08/31/2018   TSH 5.00 (H) 05/31/2018   TSH 5.630 (H) 05/03/2018   TSH 4.48 12/23/2017   TSH 3.930 10/26/2017  TSH 2.70 11/26/2016   FREET4 0.90 02/29/2020   FREET4 1.07 10/31/2019   FREET4 0.73 03/01/2019   FREET4 0.89 08/31/2018   FREET4 0.81 05/31/2018   FREET4 1.18 05/03/2018   FREET4 0.79 12/23/2017   T3FREE 2.6 03/01/2019   T3FREE 2.6 08/31/2018   T3FREE 2.5 05/31/2018   T3FREE 3.2 12/23/2017   ROS: Constitutional: no weight gain/no weight loss, no fatigue, + improved hot flushes, no subjective hypothermia Eyes: no blurry vision, no xerophthalmia ENT: no sore throat, no nodules palpated in neck, no dysphagia, no odynophagia, no hoarseness Cardiovascular: no CP/no SOB/no palpitations/no leg swelling Respiratory: no cough/no SOB/no wheezing Gastrointestinal: no N/no V/no D/no C/no acid reflux Musculoskeletal: no muscle aches/no joint aches Skin: no rashes, no hair loss, resolved itching Neurological: no tremors/no numbness/no tingling/no dizziness  I reviewed pt's medications, allergies, PMH, social hx, family hx, and changes were documented in the history of present illness. Otherwise, unchanged from my initial visit note.  Past Medical History:  Diagnosis Date  . Aortic atherosclerosis ([place]) 06/28/2019  . HTN (hypertension)   . Hyperlipidemia   . Osteoporosis    Past Surgical History:  Procedure Laterality Date  . ABDOMINAL HYSTERECTOMY    . PARATHYROIDECTOMY  N/A 09/03/2017   Procedure: NECK EXPLORATION WITH LEFT INFERIOR PARATHYROIDECTOMY AND RIGHT SUPERIOR PARATHYROIDECTOMY;  Surgeon: Armandina Gemma, MD;  Location: [facility];  Service: General;  Laterality: N/A;  . THYROID LOBECTOMY Right 09/03/2017   Procedure: RIGHT THYROID LOBECTOMY;  Surgeon: Armandina Gemma, MD;  Location: [facility];  Service: General;  Laterality: Right;   Social History   Socioeconomic History  . Marital status: Married    Spouse name: Not on file  . Number of children: Not on file  . Years of education: Not on file  . Highest education level: Not on file  Occupational History  . Not on file  Tobacco Use  . Smoking status: Never Smoker  . Smokeless tobacco: Never Used  Vaping Use  . Vaping Use: Never used  Substance and Sexual Activity  . Alcohol use: No  . Drug use: No  . Sexual activity: Never  Other Topics Concern  . Not on file  Social History Narrative  . Not on file   Social Determinants of Health   Financial Resource Strain: Not on file  Food Insecurity: Not on file  Transportation Needs: Not on file  Physical Activity: Not on file  Stress: Not on file  Social Connections: Not on file  Intimate Partner Violence: Not on file   Current Outpatient Medications on File Prior to Visit  Medication Sig Dispense Refill  . atorvastatin (LIPITOR) 40 MG tablet Take 1 tablet (40 mg total) by mouth daily. 90 tablet 1  . fluticasone (FLONASE) 50 MCG/ACT nasal spray Place 2 sprays into both nostrils daily. 16 g 1  . furosemide (LASIX) 20 MG tablet Take 1 tablet (20 mg total) by mouth daily. 30 tablet 5  . levothyroxine (SYNTHROID) 50 MCG tablet Take 1 tablet (50 mcg total) by mouth daily before breakfast. 90 tablet 1  . lisinopril (ZESTRIL) 40 MG tablet Take 1 tablet (40 mg total) by mouth daily. 90 tablet 3  . polyethylene glycol powder (GLYCOLAX/MIRALAX) powder Take 17 g by mouth 2 (two) times daily as needed. (Patient taking differently: Take 17 g by mouth 2 (two)  times daily as needed for mild constipation.) 3350 g 1  . Vitamin D, Cholecalciferol, 1000 units CAPS Take 2,000 Units by mouth daily.  No current facility-administered medications on file prior to visit.   No Known Allergies Family History  Problem Relation Age of Onset  . Diabetes Sister   . Hypertension Sister   . Hypertension Brother   . Heart disease Brother    PE: BP (!) 150/100 (BP Location: Right Arm, Patient Position: Sitting, Cuff Size: Normal)   Pulse 77   Ht 5\' 7"  (1.702 m)   Wt 191 lb (86.6 kg)   SpO2 98%   BMI 29.91 kg/m  Wt Readings from Last 3 Encounters:  01/03/21 191 lb (86.6 kg)  01/02/21 191 lb 9.6 oz (86.9 kg)  11/28/20 193 lb 3.2 oz (87.6 kg)   Constitutional: overweight, in NAD Eyes: PERRLA, EOMI, no exophthalmos ENT: moist mucous membranes, no thyromegaly, no cervical lymphadenopathy Cardiovascular: RRR, No MRG Respiratory: CTA B Gastrointestinal: abdomen soft, NT, ND, BS+ Musculoskeletal: no deformities, strength intact in all 4 Skin: moist, warm, no rashes Neurological: no tremor with outstretched hands, DTR normal in all 4  Assessment: 1. Hypercalcemia/Primary hyperparathyroidism  2.  Osteopenia  3. Vit D def  4.  Thyroid nodule  5.  Postsurgical hypothyroidism  Plan: 1. Patient with history of primary hyperparathyroidism with a previous calcium level being as high as 11.1 and a PTH as high as 142.  The hyperparathyroidism persisted even after normalization of her vitamin D.  Of note, she does have CKD which can raise the PTH, but not severe enough to cause this degree of hyperparathyroidism.  Investigation pointed towards a left inferior and right superior parathyroid adenomas.  Thyroid ultrasound showed a left inferior nodule, most consistent with a parathyroid adenoma and she had a thyroid nodule that was found incidentally at the time of her surgery.  This was benign.  She had 2 gland parathyroidectomy in 2019 and pathology showed  that only the left inferior gland was adenomatous. -Her calcium and PTH levels normalized after surgery.  Most recent calcium was normal at 9.3 on 11/08/2020  2.  Osteopenia -Reviewed her DXA scan reports from 10/2016 and 05/25/2019: At the level of the spine, her T score is approximately the same (increased from -2.4 to -2.3), however, at the level of the femoral necks, her bone density increased by 10.8%*. -Since her T-scores were in the osteopenic range, we discussed about following her without medications and repeat the bone density scan and see if she needs intervention at that time. -Of note, she was approved for Prolia in the past -She will need another bone density scan in 05/2021 - will order at next OV  3. Vit D def -Continues 2000 units vitamin D daily -Vitamin D level was normal, in 10/2019 and also in 04/2020.  Calcium level was also normal then, at 9 point -We will recheck her vitamin D level at next visit  4. Thyroid nodule -Per the latest thyroid ultrasound report from 04/2017, she had a right-sided thyroid nodule, with the largest dimension being 1.6 cm.  Biopsy of this nodule was inconclusive due to scant epithelial cells and we discussed about repeating the biopsy but she refused to have another biopsy due to discomfort of the first one. -She had right lobectomy at the time of her parathyroid surgery and the final pathology was benign -She denies neck compression symptoms  5.  Postsurgical hypothyroidism -Uncontrolled -She previously had hot flashes from levothyroxine so we switched to [place].  However, she called since last visit that [place] is not covered and we switched to Synthroid d.a.w.  This is  too expensive >> would like to retry generic LT4. -At last visit she was taking a lower dose of Tirosint and the recommended, 12.5 mcg and her TSH was high: Lab Results  Component Value Date   TSH 11.06 (H) 02/29/2020  -We increased the dose to 50 mcg daily. - pt feels  good on this dose. Itching resolved. - we discussed about taking the thyroid hormone every day, with water, >30 minutes before breakfast, separated by >4 hours from acid reflux medications, calcium, iron, multivitamins. Pt. is taking it correctly. - will check thyroid tests today: TSH and fT4 - If labs are abnormal, she will need to return for repeat TFTs in 1.5 months  Needs generic LT4 refills. Has 10 tabs of Synthroid left.  Component     Latest Ref Rng & Units 01/03/2021  TSH     0.35 - 4.50 uIU/mL 0.47  T4,Free(Direct)     0.60 - 1.60 ng/dL 1.16  TFTs are normal.  We will continue the same dose of levothyroxine.  [name], MD PhD [facility]

## 2021-01-03 NOTE — Patient Instructions (Addendum)
Please continue Synthroid 50 mcg daily.  Take the thyroid hormone  with water, at least 30 minutes before breakfast, separated by at least 4 hours from: - acid reflux medications - calcium - iron - multivitamins  Continue vitamin D 2000 units daily.  Please stop at the lab.  Please come back for a follow-up appointment in 5-6 months.

## 2021-01-04 ENCOUNTER — Encounter: Payer: Self-pay | Admitting: Internal Medicine

## 2021-01-04 ENCOUNTER — Telehealth: Payer: Self-pay

## 2021-01-04 MED ORDER — LEVOTHYROXINE SODIUM 50 MCG PO TABS: 50.0000 ug | ORAL_TABLET | Freq: Every day | ORAL | 3 refills | Status: DC

## 2021-01-04 NOTE — Telephone Encounter (Addendum)
Left a detailed message for pt. ----- Message from [name], MD sent at 01/04/2021  9:37 AM EDT ----- Can you please call pt.:  Thyroid tests are normal.  I sent a prescription for generic levothyroxine.

## 2021-01-22 ENCOUNTER — Other Ambulatory Visit: Payer: Self-pay | Admitting: Family Medicine

## 2021-01-22 DIAGNOSIS — Z1231 Encounter for screening mammogram for malignant neoplasm of breast: Secondary | ICD-10-CM

## 2021-01-25 ENCOUNTER — Ambulatory Visit
Admission: RE | Admit: 2021-01-25 | Discharge: 2021-01-25 | Disposition: A | Discharge status: Home / Self Care | Payer: Medicare PPO | Source: Ambulatory Visit | Attending: Family Medicine | Admitting: Family Medicine

## 2021-01-25 ENCOUNTER — Other Ambulatory Visit: Payer: Self-pay

## 2021-01-25 DIAGNOSIS — Z1231 Encounter for screening mammogram for malignant neoplasm of breast: Secondary | ICD-10-CM | POA: Diagnosis not present

## 2021-02-19 ENCOUNTER — Other Ambulatory Visit: Payer: Self-pay | Admitting: Internal Medicine

## 2021-02-19 MED ORDER — LEVOTHYROXINE SODIUM 50 MCG PO TABS: 50.0000 ug | ORAL_TABLET | Freq: Every day | ORAL | 3 refills | Status: DC

## 2021-03-25 ENCOUNTER — Encounter: Payer: Self-pay | Admitting: Family Medicine

## 2021-03-25 ENCOUNTER — Ambulatory Visit: Payer: Medicare PPO | Admitting: Family Medicine

## 2021-03-25 VITALS — BP 134/80 | HR 83 | Temp 97.7°F | Wt 188.4 lb

## 2021-03-25 DIAGNOSIS — M79671 Pain in right foot: Secondary | ICD-10-CM | POA: Diagnosis not present

## 2021-03-25 NOTE — Patient Instructions (Signed)
Take 2 Tylenol 4 times per day regularly for the next week and if you need more control of your pain then you can take 4 ibuprofen 3 times per day or 2 Naprosyn twice per day.  Take this for about a week and see what it does to help get rid of the pain.  If it does not go away then call for an appointment and we will do further work including an x-ray.  You can also do heat for 20 minutes 3 times per day

## 2021-03-25 NOTE — Progress Notes (Signed)
   Subjective:    Patient ID: Tracy Rowe, female    DOB: Jan 08, 1946, 75 y.o.   MRN: [id number]  HPI She complains of a 1 week history of right foot pain.  No history of injury or overuse.  It intermittently gets worse with standing and usually lasts less than an hour but can recur.  No other joints are involved.   Review of Systems     Objective:   Physical Exam Full motion of the ankle.  No laxity noted.  No palpable tenderness.  Normal strength.  No visible lesions.       Assessment & Plan:  Right foot pain Take 2 Tylenol 4 times per day regularly for the next week and if you need more control of your pain then you can take 4 ibuprofen 3 times per day or 2 Naprosyn twice per day.  Take this for about a week and see what it does to help get rid of the pain.  If it does not go away then call for an appointment and we will do further work including an x-ray.  You can also do heat for 20 minutes 3 times per day

## 2021-04-17 ENCOUNTER — Other Ambulatory Visit: Payer: Self-pay | Admitting: Family Medicine

## 2021-05-06 ENCOUNTER — Other Ambulatory Visit: Payer: Self-pay

## 2021-05-06 ENCOUNTER — Ambulatory Visit (INDEPENDENT_AMBULATORY_CARE_PROVIDER_SITE_OTHER): Payer: Medicare PPO | Admitting: Family Medicine

## 2021-05-06 ENCOUNTER — Ambulatory Visit
Admission: RE | Admit: 2021-05-06 | Discharge: 2021-05-06 | Disposition: A | Discharge status: Home / Self Care | Payer: Medicare PPO | Source: Ambulatory Visit | Attending: Family Medicine | Admitting: Family Medicine

## 2021-05-06 ENCOUNTER — Encounter: Payer: Self-pay | Admitting: Family Medicine

## 2021-05-06 VITALS — BP 158/88 | HR 78 | Temp 98.9°F | Ht 67.0 in | Wt 187.4 lb

## 2021-05-06 DIAGNOSIS — E782 Mixed hyperlipidemia: Secondary | ICD-10-CM | POA: Diagnosis not present

## 2021-05-06 DIAGNOSIS — M79671 Pain in right foot: Secondary | ICD-10-CM

## 2021-05-06 DIAGNOSIS — I700 Atherosclerosis of aorta: Secondary | ICD-10-CM

## 2021-05-06 DIAGNOSIS — I10 Essential (primary) hypertension: Secondary | ICD-10-CM

## 2021-05-06 DIAGNOSIS — M25571 Pain in right ankle and joints of right foot: Secondary | ICD-10-CM

## 2021-05-06 DIAGNOSIS — R7989 Other specified abnormal findings of blood chemistry: Secondary | ICD-10-CM

## 2021-05-06 DIAGNOSIS — M7731 Calcaneal spur, right foot: Secondary | ICD-10-CM | POA: Diagnosis not present

## 2021-05-06 DIAGNOSIS — M19071 Primary osteoarthritis, right ankle and foot: Secondary | ICD-10-CM | POA: Diagnosis not present

## 2021-05-06 DIAGNOSIS — Z23 Encounter for immunization: Secondary | ICD-10-CM

## 2021-05-06 NOTE — Progress Notes (Signed)
   Subjective:    Patient ID: Tracy Rowe, female    DOB: 1945/09/25, 75 y.o.   MRN: [id number]  HPI Chief Complaint  Patient presents with   medication check   Ankle Pain    Right ankle/foot pain since 03/25/21 visit   She is here for a medication management visit and for an acute complaint.  HTN- does not check BP at home. Reports taking medication daily.low sodium diet.   She is taking a statin most days.   Right foot pain x 2 1/2 months. Pain is anterior near her ankle. Pain is intermittent.  States a wheelchair ran over the same foot a few weeks prior.  No numbness, tingling or weakness in her foot.  Tylenol helps.   Denies fever, chills, dizziness, chest pain, palpitations, shortness of breath, abdominal pain, N/V/D, urinary symptoms, LE edema.   Reviewed allergies, medications, past medical, surgical, family, and social history.     Review of Systems Pertinent positives and negatives in the history of present illness.     Objective:   Physical Exam BP (!) 158/88 (BP Location: Left Arm, Patient Position: Sitting, Cuff Size: Normal)   Pulse 78   Temp 98.9 F (37.2 C) (Oral)   Ht '5\' 7"'$  (1.702 m)   Wt 187 lb 6.4 oz (85 kg)   SpO2 96%   BMI 29.35 kg/m    Right foot with normal sensation, pulse, ROM and strength, non tender.       Assessment & Plan:  Acute right ankle pain - Plan: DG Ankle Complete Right  Acute foot pain, right - Plan: DG Foot Complete Right  Elevated serum creatinine - Plan: Comprehensive metabolic panel  Mixed hyperlipidemia - Plan: Lipid panel  Primary hypertension - Plan: CBC with Differential/Platelet, Comprehensive metabolic panel  Aortic atherosclerosis (HCC) - Plan: Lipid panel  Needs flu shot - Plan: CANCELED: Flu Vaccine QUAD High Dose(Fluad)  Right foot exam is unremarkable. I will send her for an XR due to ongoing pain.  Continue medications for HTN, HL and continue seeing endocrinologist.  Follow up pending lab  results.  She is interested in the flu vaccine study with [employer].

## 2021-05-06 NOTE — Patient Instructions (Signed)
Keep an eye on your blood pressure at home.  Goal is less than 130/80.   Continue taking your current medications.    DASH Eating Plan DASH stands for Dietary Approaches to Stop Hypertension. The DASH eating plan is a healthy eating plan that has been shown to: Reduce high blood pressure (hypertension). Reduce your risk for type 2 diabetes, heart disease, and stroke. Help with weight loss. What are tips for following this plan? Reading food labels Check food labels for the amount of salt (sodium) per serving. Choose foods with less than 5 percent of the Daily Value of sodium. Generally, foods with less than 300 milligrams (mg) of sodium per serving fit into this eating plan. To find whole grains, look for the word "whole" as the first word in the ingredient list. Shopping Buy products labeled as "low-sodium" or "no salt added." Buy fresh foods. Avoid canned foods and pre-made or frozen meals. Cooking Avoid adding salt when cooking. Use salt-free seasonings or herbs instead of table salt or sea salt. Check with your health care provider or pharmacist before using salt substitutes. Do not fry foods. Cook foods using healthy methods such as baking, boiling, grilling, roasting, and broiling instead. Cook with heart-healthy oils, such as olive, canola, avocado, soybean, or sunflower oil. Meal planning  Eat a balanced diet that includes: 4 or more servings of fruits and 4 or more servings of vegetables each day. Try to fill one-half of your plate with fruits and vegetables. 6-8 servings of whole grains each day. Less than 6 oz (170 g) of lean meat, poultry, or fish each day. A 3-oz (85-g) serving of meat is about the same size as a deck of cards. One egg equals 1 oz (28 g). 2-3 servings of low-fat dairy each day. One serving is 1 cup (237 mL). 1 serving of nuts, seeds, or beans 5 times each week. 2-3 servings of heart-healthy fats. Healthy fats called omega-3 fatty acids are found in foods  such as walnuts, flaxseeds, fortified milks, and eggs. These fats are also found in cold-water fish, such as sardines, salmon, and mackerel. Limit how much you eat of: Canned or prepackaged foods. Food that is high in trans fat, such as some fried foods. Food that is high in saturated fat, such as fatty meat. Desserts and other sweets, sugary drinks, and other foods with added sugar. Full-fat dairy products. Do not salt foods before eating. Do not eat more than 4 egg yolks a week. Try to eat at least 2 vegetarian meals a week. Eat more home-cooked food and less restaurant, buffet, and fast food. Lifestyle When eating at a restaurant, ask that your food be prepared with less salt or no salt, if possible. If you drink alcohol: Limit how much you use to: 0-1 drink a day for women who are not pregnant. 0-2 drinks a day for men. Be aware of how much alcohol is in your drink. In the [place] one drink equals one 12 oz bottle of beer (355 mL), one 5 oz glass of wine (148 mL), or one 1 oz glass of hard liquor (44 mL). General information Avoid eating more than 2,300 mg of salt a day. If you have hypertension, you may need to reduce your sodium intake to 1,500 mg a day. Work with your health care provider to maintain a healthy body weight or to lose weight. Ask what an ideal weight is for you. Get at least 30 minutes of exercise that causes your heart to  beat faster (aerobic exercise) most days of the week. Activities may include walking, swimming, or biking. Work with your health care provider or dietitian to adjust your eating plan to your individual calorie needs. What foods should I eat? Fruits All fresh, dried, or frozen fruit. Canned fruit in natural juice (without added sugar). Vegetables Fresh or frozen vegetables (raw, steamed, roasted, or grilled). Low-sodium or reduced-sodium tomato and vegetable juice. Low-sodium or reduced-sodium tomato sauce and tomato paste. Low-sodium or  reduced-sodium canned vegetables. Grains Whole-grain or whole-wheat bread. Whole-grain or whole-wheat pasta. Brown rice. [place]. Bulgur. Whole-grain and low-sodium cereals. Pita bread. Low-fat, low-sodium crackers. Whole-wheat flour tortillas. Meats and other proteins Skinless chicken or [place]. Ground chicken or [place]. Pork with fat trimmed off. Fish and seafood. Egg whites. Dried beans, peas, or lentils. Unsalted nuts, nut butters, and seeds. Unsalted canned beans. Lean cuts of beef with fat trimmed off. Low-sodium, lean precooked or cured meat, such as sausages or meat loaves. Dairy Low-fat (1%) or fat-free (skim) milk. Reduced-fat, low-fat, or fat-free cheeses. Nonfat, low-sodium ricotta or cottage cheese. Low-fat or nonfat yogurt. Low-fat, low-sodium cheese. Fats and oils Soft margarine without trans fats. Vegetable oil. Reduced-fat, low-fat, or light mayonnaise and salad dressings (reduced-sodium). Canola, safflower, olive, avocado, soybean, and sunflower oils. Avocado. Seasonings and condiments Herbs. Spices. Seasoning mixes without salt. Other foods Unsalted popcorn and pretzels. Fat-free sweets. The items listed above may not be a complete list of foods and beverages you can eat. Contact a dietitian for more information. What foods should I avoid? Fruits Canned fruit in a light or heavy syrup. Fried fruit. Fruit in cream or butter sauce. Vegetables Creamed or fried vegetables. Vegetables in a cheese sauce. Regular canned vegetables (not low-sodium or reduced-sodium). Regular canned tomato sauce and paste (not low-sodium or reduced-sodium). Regular tomato and vegetable juice (not low-sodium or reduced-sodium). Angie Fava. Olives. Grains Baked goods made with fat, such as croissants, muffins, or some breads. Dry pasta or rice meal packs. Meats and other proteins Fatty cuts of meat. Ribs. Fried meat. Berniece Salines. Bologna, salami, and other precooked or cured meats, such as sausages or  meat loaves. Fat from the back of a pig (fatback). Bratwurst. Salted nuts and seeds. Canned beans with added salt. Canned or smoked fish. Whole eggs or egg yolks. Chicken or [place] with skin. Dairy Whole or 2% milk, cream, and half-and-half. Whole or full-fat cream cheese. Whole-fat or sweetened yogurt. Full-fat cheese. Nondairy creamers. Whipped toppings. Processed cheese and cheese spreads. Fats and oils Butter. Stick margarine. Lard. Shortening. Ghee. Bacon fat. Tropical oils, such as coconut, palm kernel, or palm oil. Seasonings and condiments Onion salt, garlic salt, seasoned salt, table salt, and sea salt. Worcestershire sauce. Tartar sauce. Barbecue sauce. Teriyaki sauce. Soy sauce, including reduced-sodium. Steak sauce. Canned and packaged gravies. Fish sauce. Oyster sauce. Cocktail sauce. Store-bought horseradish. Ketchup. Mustard. Meat flavorings and tenderizers. Bouillon cubes. Hot sauces. Pre-made or packaged marinades. Pre-made or packaged taco seasonings. Relishes. Regular salad dressings. Other foods Salted popcorn and pretzels. The items listed above may not be a complete list of foods and beverages you should avoid. Contact a dietitian for more information. Where to find more information National Heart, Lung, and Blood Institute: https://wilson-eaton.com/ American Heart Association: www.heart.org Academy of Nutrition and Dietetics: www.eatright.[place]: www.kidney.org Summary The DASH eating plan is a healthy eating plan that has been shown to reduce high blood pressure (hypertension). It may also reduce your risk for type 2 diabetes, heart disease, and stroke. When  on the DASH eating plan, aim to eat more fresh fruits and vegetables, whole grains, lean proteins, low-fat dairy, and heart-healthy fats. With the DASH eating plan, you should limit salt (sodium) intake to 2,300 mg a day. If you have hypertension, you may need to reduce your sodium intake to 1,500 mg a  day. Work with your health care provider or dietitian to adjust your eating plan to your individual calorie needs. This information is not intended to replace advice given to you by your health care provider. Make sure you discuss any questions you have with your health care provider. Document Revised: 07/15/2019 Document Reviewed: 07/15/2019 Elsevier Patient Education  2022 [employer].

## 2021-05-07 ENCOUNTER — Other Ambulatory Visit: Payer: Self-pay | Admitting: Family Medicine

## 2021-05-07 DIAGNOSIS — M79671 Pain in right foot: Secondary | ICD-10-CM

## 2021-05-07 DIAGNOSIS — M25571 Pain in right ankle and joints of right foot: Secondary | ICD-10-CM

## 2021-05-07 LAB — COMPREHENSIVE METABOLIC PANEL
ALT: 5 IU/L (ref 0–32)
AST: 17 IU/L (ref 0–40)
Albumin/Globulin Ratio: 1.6 (ref 1.2–2.2)
Albumin: 4.4 g/dL (ref 3.7–4.7)
Alkaline Phosphatase: 63 IU/L (ref 44–121)
BUN/Creatinine Ratio: 11 — ABNORMAL LOW (ref 12–28)
BUN: 13 mg/dL (ref 8–27)
Bilirubin Total: 1.1 mg/dL (ref 0.0–1.2)
CO2: 25 mmol/L (ref 20–29)
Calcium: 9.5 mg/dL (ref 8.7–10.3)
Chloride: 108 mmol/L — ABNORMAL HIGH (ref 96–106)
Creatinine, Ser: 1.18 mg/dL — ABNORMAL HIGH (ref 0.57–1.00)
Globulin, Total: 2.7 g/dL (ref 1.5–4.5)
Glucose: 95 mg/dL (ref 65–99)
Potassium: 4.1 mmol/L (ref 3.5–5.2)
Sodium: 147 mmol/L — ABNORMAL HIGH (ref 134–144)
Total Protein: 7.1 g/dL (ref 6.0–8.5)
eGFR: 48 mL/min/{1.73_m2} — ABNORMAL LOW (ref >59)

## 2021-05-07 LAB — CBC WITH DIFFERENTIAL/PLATELET
Basophils Absolute: 0.1 10*3/uL (ref 0.0–0.2)
Basos: 1 %
EOS (ABSOLUTE): 0.1 10*3/uL (ref 0.0–0.4)
Eos: 2 %
Hematocrit: 38.1 % (ref 34.0–46.6)
Hemoglobin: 12.7 g/dL (ref 11.1–15.9)
Immature Grans (Abs): 0 10*3/uL (ref 0.0–0.1)
Immature Granulocytes: 0 %
Lymphocytes Absolute: 1.8 10*3/uL (ref 0.7–3.1)
Lymphs: 37 %
MCH: 26.7 pg (ref 26.6–33.0)
MCHC: 33.3 g/dL (ref 31.5–35.7)
MCV: 80 fL (ref 79–97)
Monocytes Absolute: 0.3 10*3/uL (ref 0.1–0.9)
Monocytes: 6 %
Neutrophils Absolute: 2.6 10*3/uL (ref 1.4–7.0)
Neutrophils: 54 %
Platelets: 186 10*3/uL (ref 150–450)
RBC: 4.75 x10E6/uL (ref 3.77–5.28)
RDW: 12.8 % (ref 11.7–15.4)
WBC: 4.8 10*3/uL (ref 3.4–10.8)

## 2021-05-07 LAB — LIPID PANEL
Chol/HDL Ratio: 3.7 ratio (ref 0.0–4.4)
Cholesterol, Total: 144 mg/dL (ref 100–199)
HDL: 39 mg/dL — ABNORMAL LOW (ref >39)
LDL Chol Calc (NIH): 87 mg/dL (ref 0–99)
Triglycerides: 98 mg/dL (ref 0–149)
VLDL Cholesterol Cal: 18 mg/dL (ref 5–40)

## 2021-05-07 NOTE — Progress Notes (Signed)
Her labs are stable. Continue on the statin. Refill if needed for her.

## 2021-05-07 NOTE — Progress Notes (Signed)
Her ankle XR shows mild degenerative changes with a bone spur on her heel. Nothing to explain her symptoms. If she would like I am happy to have an orthopedist check it. Does she have an ortho also?

## 2021-05-13 ENCOUNTER — Ambulatory Visit: Payer: Medicare PPO | Admitting: Orthopedic Surgery

## 2021-05-13 ENCOUNTER — Encounter: Payer: Self-pay | Admitting: Orthopedic Surgery

## 2021-05-13 DIAGNOSIS — M25571 Pain in right ankle and joints of right foot: Secondary | ICD-10-CM | POA: Diagnosis not present

## 2021-05-13 NOTE — Progress Notes (Signed)
Office Visit Note   Patient: Tracy Rowe           Date of Birth: [birth year]           MRN: [id number] Visit Date: 05/13/2021              Requested by: Girtha Rm, NP-C [place],  [place] [address] PCP: Girtha Rm, NP-C  Chief Complaint  Patient presents with   Right Foot - Pain   Right Ankle - Pain      HPI: Patient is a 75 year old woman who presents complaining of lateral right ankle and foot pain for several months she denies any specific injury denies spraining her ankle.  She has been using ice and heat she states it is worse at night and with start up.  She states that its a tooth ache like pain.  Assessment & Plan: Visit Diagnoses:  1. Pain in right ankle and joints of right foot     Plan: Discussed treatment options with either Voltaren gel 3 times a day or a subtalar injection.  Patient states she would like to proceed with the Voltaren gel and if she does not have sufficient relief she will call we will set her up for a subtalar injection on the right.  Follow-Up Instructions: Return if symptoms worsen or fail to improve.   Ortho Exam  Patient is alert, oriented, no adenopathy, well-dressed, normal affect, normal respiratory effort. Examination patient has good pulses she has good ankle good subtalar motion no Achilles contraction.  She has no tenderness to palpation anteriorly over the ankle the anterior drawer is stable she is point tender to palpation over the sinus Tarsi with pain in the subtalar joint.  Palpation of the subtalar joint reproduces her symptoms.  The lateral ankle ligaments are nontender to palpation.  Imaging: No results found. No images are attached to the encounter.  Labs: No results found for: HGBA1C, ESRSEDRATE, CRP, LABURIC, REPTSTATUS, GRAMSTAIN, CULT, LABORGA   Lab Results  Component Value Date   ALBUMIN 4.4 05/06/2021   ALBUMIN 4.3 10/31/2020   ALBUMIN 4.4 05/03/2020    Lab Results   Component Value Date   MG 1.8 03/02/2017   Lab Results  Component Value Date   VD25OH 37.8 05/03/2020   VD25OH 44.6 10/31/2019   VD25OH 37.23 03/01/2019    No results found for: PREALBUMIN CBC EXTENDED Latest Ref Rng & Units 05/06/2021 10/31/2020 05/03/2020  WBC 3.4 - 10.8 x10E3/uL 4.8 5.1 5.3  RBC 3.77 - 5.28 x10E6/uL 4.75 5.04 4.92  HGB 11.1 - 15.9 g/dL 12.7 13.8 13.4  HCT 34.0 - 46.6 % 38.1 41.9 40.1  PLT 150 - 450 x10E3/uL 186 196 171  NEUTROABS 1.4 - 7.0 x10E3/uL 2.6 3.2 3.5  LYMPHSABS 0.7 - 3.1 x10E3/uL 1.8 1.5 1.3     There is no height or weight on file to calculate BMI.  Orders:  No orders of the defined types were placed in this encounter.  No orders of the defined types were placed in this encounter.    Procedures: No procedures performed  Clinical Data: No additional findings.  ROS:  All other systems negative, except as noted in the HPI. Review of Systems  Objective: Vital Signs: There were no vitals taken for this visit.  Specialty Comments:  No specialty comments available.  PMFS History: Patient Active Problem List   Diagnosis Date Noted   Aortic atherosclerosis ([place]) 06/28/2019   Elevated serum creatinine 10/29/2018  Primary hyperparathyroidism ([place]) 09/03/2017   Thyroid nodule 08/14/2017   Hyperlipidemia    Osteopenia 01/12/2017   Vitamin D deficiency 10/24/2016   Hypertension 09/25/2016   Past Medical History:  Diagnosis Date   Aortic atherosclerosis ([place]) 06/28/2019   HTN (hypertension)    Hyperlipidemia    Osteoporosis     Family History  Problem Relation Age of Onset   Diabetes Sister    Hypertension Sister    Hypertension Brother    Heart disease Brother     Past Surgical History:  Procedure Laterality Date   ABDOMINAL HYSTERECTOMY     PARATHYROIDECTOMY N/A 09/03/2017   Procedure: NECK EXPLORATION WITH LEFT INFERIOR PARATHYROIDECTOMY AND RIGHT SUPERIOR PARATHYROIDECTOMY;  Surgeon: Armandina Gemma, MD;  Location: [facility];   Service: General;  Laterality: N/A;   THYROID LOBECTOMY Right 09/03/2017   Procedure: RIGHT THYROID LOBECTOMY;  Surgeon: Armandina Gemma, MD;  Location: [facility];  Service: General;  Laterality: Right;   Social History   Occupational History   Not on file  Tobacco Use   Smoking status: Never   Smokeless tobacco: Never  Vaping Use   Vaping Use: Never used  Substance and Sexual Activity   Alcohol use: No   Drug use: No   Sexual activity: Never

## 2021-05-31 ENCOUNTER — Telehealth: Payer: Self-pay | Admitting: Internal Medicine

## 2021-05-31 NOTE — Telephone Encounter (Signed)
Pt changed her appt to 11/29 and verbalized that she was okay on her medication and doesn't need any refills. Instructed her to call us if that changes.

## 2021-06-03 ENCOUNTER — Other Ambulatory Visit: Payer: Self-pay | Admitting: Family Medicine

## 2021-06-03 NOTE — Telephone Encounter (Signed)
Please advise due to you leaving. [place]

## 2021-06-06 ENCOUNTER — Ambulatory Visit: Payer: Medicare PPO | Admitting: Internal Medicine

## 2021-06-26 ENCOUNTER — Other Ambulatory Visit (INDEPENDENT_AMBULATORY_CARE_PROVIDER_SITE_OTHER): Payer: Medicare PPO

## 2021-06-26 ENCOUNTER — Other Ambulatory Visit: Payer: Self-pay

## 2021-06-26 DIAGNOSIS — Z23 Encounter for immunization: Secondary | ICD-10-CM | POA: Diagnosis not present

## 2021-07-10 ENCOUNTER — Telehealth: Payer: Self-pay | Admitting: Family Medicine

## 2021-07-10 MED ORDER — FUROSEMIDE 20 MG PO TABS: 20.0000 mg | ORAL_TABLET | Freq: Every day | ORAL | 0 refills | Status: DC

## 2021-07-10 NOTE — Telephone Encounter (Signed)
[facility] sent refill request for furosemide 20 mg please send to the [facility] [place] - [address]

## 2021-07-23 ENCOUNTER — Ambulatory Visit: Payer: Medicare PPO | Admitting: Internal Medicine

## 2021-07-23 ENCOUNTER — Other Ambulatory Visit: Payer: Self-pay

## 2021-07-23 ENCOUNTER — Encounter: Payer: Self-pay | Admitting: Internal Medicine

## 2021-07-23 VITALS — BP 122/78 | HR 68 | Ht 67.0 in | Wt 188.2 lb

## 2021-07-23 DIAGNOSIS — M8580 Other specified disorders of bone density and structure, unspecified site: Secondary | ICD-10-CM | POA: Diagnosis not present

## 2021-07-23 DIAGNOSIS — E041 Nontoxic single thyroid nodule: Secondary | ICD-10-CM

## 2021-07-23 DIAGNOSIS — E559 Vitamin D deficiency, unspecified: Secondary | ICD-10-CM | POA: Diagnosis not present

## 2021-07-23 DIAGNOSIS — E890 Postprocedural hypothyroidism: Secondary | ICD-10-CM | POA: Diagnosis not present

## 2021-07-23 DIAGNOSIS — E210 Primary hyperparathyroidism: Secondary | ICD-10-CM

## 2021-07-23 LAB — VITAMIN D 25 HYDROXY (VIT D DEFICIENCY, FRACTURES): VITD: 44.4 ng/mL (ref 30.00–100.00)

## 2021-07-23 LAB — T4, FREE: Free T4: 1.23 ng/dL (ref 0.60–1.60)

## 2021-07-23 LAB — TSH: TSH: 0.54 u[IU]/mL (ref 0.35–5.50)

## 2021-07-23 NOTE — Progress Notes (Signed)
Patient ID: DEANNA WIATER, female   DOB: 02/18/1946, 75 y.o.   MRN: [id number]   This visit occurred during the SARS-CoV-2 public health emergency.  Safety protocols were in place, including screening questions prior to the visit, additional usage of staff PPE, and extensive cleaning of exam room while observing appropriate contact time as indicated for disinfecting solutions.   HPI  AMARYLIS ROVITO is a 75 y.o. female, returning for follow-up for h/o primary hyperparathyroidism, vitamin D deficiency, osteopenia, thyroid nodule, and now postsurgical hypothyroidism diagnosed after last visit. Last visit 6 months ago.  Interim history: No falls or fractures since last visit. She feels well at this visit, without concerns.  Primary hyperparathyroidism: She was diagnosed with hypercalcemia in 09/2016 but she had a technetium sestamibi scan that was positive for parathyroid adenoma in 2002.  Reviewed pertinent labs: Lab Results  Component Value Date   PTH 52 12/23/2017   PTH Comment 12/23/2017   PTH 95 (H) 08/14/2017   PTH Comment 08/14/2017   PTH 55 03/02/2017   PTH 142 (H) 11/26/2016   CALCIUM 9.5 05/06/2021   CALCIUM 9.3 11/08/2020   CALCIUM 9.5 10/31/2020   CALCIUM 9.4 05/03/2020   CALCIUM 9.5 12/06/2019   CALCIUM 9.7 11/10/2019   CALCIUM 9.2 10/31/2019   CALCIUM 9.4 06/29/2019   CALCIUM 9.2 10/29/2018   CALCIUM 9.3 05/07/2018   She had an indeterminate Tc sestamibi scan (2002): NO DEFINITE PARATHYROID ADENOPATHY LOCALIZED IN THE NECK OR CHEST ALTHOUGH THERE IS A FOCUS OF MILDLY PROMINENT ACTIVITY AT THE INFERIOR POLE OF THE LEFT LOBE OF THE THYROID WHICH WOULD BE THE MOST LIKELY SITE ON THIS SCAN.  Other labs reviewed: Component     Latest Ref Rng & Units 03/02/2017  Vitamin D 1, 25 (OH) Total     18 - 72 pg/mL 68  Vitamin D3 1, 25 (OH)     pg/mL 44  Vitamin D2 1, 25 (OH)     pg/mL 24  Phosphorus     2.3 - 4.6 mg/dL 2.4  Magnesium     1.5 - 2.5 mg/dL 1.8    Component     Latest Ref Rng & Units 03/04/2017  Creatinine, Urine     20 - 320 mg/dL 63  Creatinine, 24H Ur     0.63 - 2.50 g/24 h 0.50 (L)  Calcium, Ur     Not estab mg/dL 3  Calcium, 24 hour urine     35 - 250 mg/24 h 24 (L)  Urinary calcium is low, but the creatinine was also low.  I did not ask her to repeat a collection, since even a higher creatinine level will most likely not push her over the limit for hypercalciuria.    She had the following investigations:  Technetium sestamibi parathyroid scan (05/05/2017): Left inferior parathyroid adenoma and a possible right superior parathyroid adenoma versus thyroid nodule.   Thyroid ultrasound (05/20/2017): Several nodules, of which one right superior measuring 1.6 x 1.3 x 1.3, solid, hypoechoic; another left inferior 1.9 x 1.3 x 1.3 cm, mixed, hypoechoic, presumed to be an enlarged parathyroid gland  FNA of the 1.6 cm nodule (07/08/2017): Scant epithelium (Bethesda category 1)  She had parathyroidectomy + right lobectomy on 09/03/2017.  Left inferior parathyroid adenoma was resected.  The right superior parathyroid did not contain an adenoma.  R Thyroid nodule was benign.  Diagnosis 1. Parathyroid gland, left inferior - HYPERCELLULAR PARATHYROID TISSUE CONSISTENT WITH ADENOMA 2. Parathyroid gland, right superior - PARATHYROID TISSUE  3. Thyroid, lobectomy, right lobe - BENIGN FOLLICULAR ADENOMA (1.2 CM) The right thyroid lobe has an adenomatous lesion which has a very thin fibrous capsule consistent with follicular adenoma. There is no evidence of capsular or vascular invasion. The surrounding thyroid parenchyma is unremarkable.  Calcium normalized after surgery.  Osteopenia: Reviewed previous DXA scan reports: Date L1-4 T score FN T score  05/25/2019  L2, L3: -2.3 (+1.3%) RFN: -2.1 LFN: -1.9 Overall: +10.8%*  11/19/2016 L2, L3:-2.4 RFN: -2.3 LFN: -2.3  08/09/2007 L1, L3, L4: -2.0 RFN: n/a LFN: -1.8  Per review of the  chart, she was approved for Prolia.  No history of kidney stones.  + Mild CKD.  Reviewed BUN/Cr: Lab Results  Component Value Date   BUN 13 05/06/2021   BUN 12 11/08/2020   BUN 12 10/31/2020   BUN 15 05/03/2020   BUN 11 12/06/2019   BUN 13 11/10/2019   BUN 12 10/31/2019   BUN 14 06/29/2019   BUN 11 10/29/2018   BUN 18 05/07/2018   Lab Results  Component Value Date   CREATININE 1.18 (H) 05/06/2021   CREATININE 1.20 (H) 11/08/2020   CREATININE 1.14 (H) 10/31/2020   CREATININE 1.14 (H) 05/03/2020   CREATININE 1.10 (H) 12/06/2019   CREATININE 1.14 (H) 11/10/2019   CREATININE 1.11 (H) 10/31/2019   CREATININE 1.23 (H) 06/29/2019   CREATININE 1.16 (H) 10/29/2018   CREATININE 1.33 (H) 05/07/2018   She was previously on HCTZ in the past but we changed to Lasix during investigation for hyperparathyroidism.  History of vitamin D deficiency:  Reviewed her vitamin D levels: Lab Results  Component Value Date   VD25OH 37.8 05/03/2020   VD25OH 44.6 10/31/2019   VD25OH 37.23 03/01/2019   VD25OH 39.38 12/23/2017   VD25OH 38.66 08/14/2017   VD25OH 26.51 (L) 05/15/2017   VD25OH 31.30 03/02/2017   VD25OH 30 12/29/2016   VD25OH 10 (L) 10/23/2016   She takes 2000 units of vitamin D daily.  She was previously on ergocalciferol.  Pt does not have a FH of osteoporosis.  She also has HTN.  Postsurgical hypothyroidism  -Developed after her right hemithyroidectomy in 2019  In 02/2019 I advised her to start levothyroxine 25 mcg daily.  She develops hot flashes and stopped.  In 10/2019, TSH was still high so I advised her to restart the lower dose of levothyroxine. She started, but she again developed hot flashes and itching right after taking the levothyroxine tablet.  We switched to Tirosint (liquid levothyroxine) and I advised her to increase the dose to 50 mcg daily.  However, at last visit, she was only taking 12.5 mcg.  I advised her to increase the dose to 50 mcg daily.  Since then,  we had to stop Tirosint due to insurance coverage and switch to Synthroid d.a.w. . However, this was $$$ >> we switched to generic in 12/2020.  She takes the levothyroxine - daily - in am - fasting - no coffee - at least 30 min from b'fast - no calcium - no iron - no multivitamins - no PPIs - not on Biotin  Reviewed her TFTs: Lab Results  Component Value Date   TSH 0.47 01/03/2021   TSH 11.06 (H) 02/29/2020   TSH 10.100 (H) 10/31/2019   TSH 11.65 (H) 03/01/2019   TSH 4.42 08/31/2018   TSH 5.00 (H) 05/31/2018   TSH 5.630 (H) 05/03/2018   TSH 4.48 12/23/2017   TSH 3.930 10/26/2017   TSH 2.70 11/26/2016   FREET4  1.16 01/03/2021   FREET4 0.90 02/29/2020   FREET4 1.07 10/31/2019   FREET4 0.73 03/01/2019   FREET4 0.89 08/31/2018   FREET4 0.81 05/31/2018   FREET4 1.18 05/03/2018   FREET4 0.79 12/23/2017   T3FREE 2.6 03/01/2019   T3FREE 2.6 08/31/2018   T3FREE 2.5 05/31/2018   T3FREE 3.2 12/23/2017   ROS: + see HPI  I reviewed pt's medications, allergies, PMH, social hx, family hx, and changes were documented in the history of present illness. Otherwise, unchanged from my initial visit note.  Past Medical History:  Diagnosis Date   Aortic atherosclerosis ([place]) 06/28/2019   HTN (hypertension)    Hyperlipidemia    Osteoporosis    Past Surgical History:  Procedure Laterality Date   ABDOMINAL HYSTERECTOMY     PARATHYROIDECTOMY N/A 09/03/2017   Procedure: NECK EXPLORATION WITH LEFT INFERIOR PARATHYROIDECTOMY AND RIGHT SUPERIOR PARATHYROIDECTOMY;  Surgeon: Armandina Gemma, MD;  Location: [facility];  Service: General;  Laterality: N/A;   THYROID LOBECTOMY Right 09/03/2017   Procedure: RIGHT THYROID LOBECTOMY;  Surgeon: Armandina Gemma, MD;  Location: [facility];  Service: General;  Laterality: Right;   Social History   Socioeconomic History   Marital status: Married    Spouse name: Not on file   Number of children: Not on file   Years of education: Not on file   Highest education  level: Not on file  Occupational History   Not on file  Tobacco Use   Smoking status: Never   Smokeless tobacco: Never  Vaping Use   Vaping Use: Never used  Substance and Sexual Activity   Alcohol use: No   Drug use: No   Sexual activity: Never  Other Topics Concern   Not on file  Social History Narrative   Not on file   Social Determinants of Health   Financial Resource Strain: Not on file  Food Insecurity: Not on file  Transportation Needs: Not on file  Physical Activity: Not on file  Stress: Not on file  Social Connections: Not on file  Intimate Partner Violence: Not on file   Current Outpatient Medications on File Prior to Visit  Medication Sig Dispense Refill   atorvastatin (LIPITOR) 40 MG tablet Take 1 tablet (40 mg total) by mouth daily. 90 tablet 1   fluticasone (FLONASE) 50 MCG/ACT nasal spray Place 2 sprays into both nostrils daily. 16 g 1   furosemide (LASIX) 20 MG tablet Take 1 tablet (20 mg total) by mouth daily. 30 tablet 0   levothyroxine (SYNTHROID) 50 MCG tablet Take 1 tablet (50 mcg total) by mouth daily. GENERIC. 90 tablet 3   lisinopril (ZESTRIL) 40 MG tablet Take 1 tablet (40 mg total) by mouth daily. 90 tablet 3   polyethylene glycol powder (GLYCOLAX/MIRALAX) powder Take 17 g by mouth 2 (two) times daily as needed. (Patient taking differently: Take 17 g by mouth 2 (two) times daily as needed for mild constipation.) 3350 g 1   Vitamin D, Cholecalciferol, 1000 units CAPS Take 2,000 Units by mouth daily.      No current facility-administered medications on file prior to visit.   No Known Allergies Family History  Problem Relation Age of Onset   Diabetes Sister    Hypertension Sister    Hypertension Brother    Heart disease Brother    PE: There were no vitals taken for this visit. Wt Readings from Last 3 Encounters:  05/06/21 187 lb 6.4 oz (85 kg)  03/25/21 188 lb 6.4 oz (85.5 kg)  01/03/21 191 lb (86.6 kg)   Constitutional: overweight, in  NAD Eyes: PERRLA, EOMI, no exophthalmos ENT: moist mucous membranes, no thyromegaly, no cervical lymphadenopathy Cardiovascular: RRR, No MRG Respiratory: CTA B Musculoskeletal: no deformities, strength intact in all 4 Skin: moist, warm, no rashes Neurological: no tremor with outstretched hands, DTR normal in all 4  Assessment: 1. Hypercalcemia/Primary hyperparathyroidism  2.  Osteopenia  3. Vit D def  4.  Thyroid nodule  5.  Postsurgical hypothyroidism  Plan: 1. Patient with history of primary hyperparathyroidism with a previous calcium level being as high as 11.1 and a PTH as high as 142.  The hyperparathyroidism persisted even after normalization of her vitamin D.  Of note, she does have CKD which can raise the PTH, but not severe enough to cause this degree of hyperparathyroidism.  Investigation pointed towards a left inferior and right superior parathyroid adenomas.  Thyroid ultrasound showed a left inferior nodule, most consistent with a parathyroid adenoma and she had a thyroid nodule that was found incidentally at the time of her surgery.  Thyroid ultrasound showed a left inferior nodule, most consistent with a parathyroid adenoma and she had a thyroid nodule that was found incidentally at the time of her surgery - this was benign. -She had 2 gland parathyroidectomy in 2019 and pathology showed that only the left inferior gland was adenomatous -Her PTH and calcium levels normalized after surgery.  Most recent calcium was normal at 9.5 on 05/06/2021.  We will not repeat this today.  2.  Osteopenia -Reviewed her DXA scan report from 10/2017 and 04/2019: At the level of the spine, her T score is approximately the same (increased from -2.4 to -2.3), however, at the level of the femoral necks, bone density increased by 10.8%* -Since her T-scores were in the osteopenic range, we discussed about following her without medications and repeat the bone density scan, after which we can see if  she needs antiresorptive medication. -Of note, she was approved for Prolia in the past -She needs another bone density scan now-I ordered it -We discussed that after the results return, we may need to start medication for this.  She agrees.  3. Vit D def -Continues on 2000 units vitamin D daily -Calcium and vitamin D levels were normal at last check -We will recheck her vitamin D level today  4. Thyroid nodule -Per the latest thyroid ultrasound report from 04/2017, she had a right-sided thyroid nodule, with the largest dimension being 1.6 cm.  Biopsy of this nodule was inconclusive due to scant epithelial cells and we discussed about repeating the biopsy but she refused to have another biopsy due to discomfort of the first one. -She had a right lobectomy at the time of her parathyroid surgery and the final pathology was benign -She denies neck compression symptoms  5.  Postsurgical hypothyroidism -Previously uncontrolled -She previously had hot flashes from levothyroxine so we switched to Tirosint due to itching with the levothyroxine tablet.  However, this was not covered so we switched to Synthroid d.a.w.  Afterwards, since this was also expensive, we retried generic levothyroxine -we have her on 50 mcg daily dose, which is without coloring agents. - latest thyroid labs reviewed with pt. >> normal: Lab Results  Component Value Date   TSH 0.47 01/03/2021  - she continues on LT4 50 mcg daily - pt feels good on this dose. - we discussed about taking the thyroid hormone every day, with water, >30 minutes before breakfast, separated by >4  hours from acid reflux medications, calcium, iron, multivitamins. Pt. is taking it correctly. - We will recheck her TSH and free T4 at today's visit  Component     Latest Ref Rng & Units 07/23/2021  TSH     0.35 - 5.50 uIU/mL 0.54  VITD     30.00 - 100.00 ng/mL 44.40  T4,Free(Direct)     0.60 - 1.60 ng/dL 1.23   TFTs and vitamin D level  normal.    [name], MD PhD [facility] Endocrinology

## 2021-07-23 NOTE — Patient Instructions (Addendum)
Please continue Levothyroxine 50 mcg daily.  Take the thyroid hormone  with water, at least 30 minutes before breakfast, separated by at least 4 hours from: - acid reflux medications - calcium - iron - multivitamins  Continue vitamin D 2000 units daily.  [facility]  Please stop at the lab.  Please come back for a follow-up appointment in 1 year.

## 2021-07-31 ENCOUNTER — Other Ambulatory Visit: Payer: Self-pay

## 2021-07-31 ENCOUNTER — Ambulatory Visit (INDEPENDENT_AMBULATORY_CARE_PROVIDER_SITE_OTHER): Payer: Medicare PPO

## 2021-07-31 DIAGNOSIS — Z23 Encounter for immunization: Secondary | ICD-10-CM | POA: Diagnosis not present

## 2021-08-05 ENCOUNTER — Other Ambulatory Visit: Payer: Self-pay | Admitting: Family Medicine

## 2021-10-09 ENCOUNTER — Other Ambulatory Visit: Payer: Self-pay | Admitting: Family Medicine

## 2021-10-10 ENCOUNTER — Telehealth: Payer: Self-pay | Admitting: Physician Assistant

## 2021-10-10 ENCOUNTER — Other Ambulatory Visit: Payer: Self-pay

## 2021-10-10 MED ORDER — ATORVASTATIN CALCIUM 40 MG PO TABS: 40.0000 mg | ORAL_TABLET | Freq: Every day | ORAL | 1 refills | Status: DC

## 2021-10-10 NOTE — Telephone Encounter (Signed)
[facility] sent refill request for atorvastatin 40 mg please send to the [facility] ([place]), [place] - [address]

## 2021-10-16 ENCOUNTER — Ambulatory Visit
Admission: RE | Admit: 2021-10-16 | Discharge: 2021-10-16 | Disposition: A | Discharge status: Home / Self Care | Payer: Medicare PPO | Source: Ambulatory Visit | Attending: Internal Medicine | Admitting: Internal Medicine

## 2021-10-16 ENCOUNTER — Other Ambulatory Visit: Payer: Self-pay

## 2021-10-16 DIAGNOSIS — M8580 Other specified disorders of bone density and structure, unspecified site: Secondary | ICD-10-CM

## 2021-10-16 DIAGNOSIS — M8588 Other specified disorders of bone density and structure, other site: Secondary | ICD-10-CM | POA: Diagnosis not present

## 2021-10-16 DIAGNOSIS — E210 Primary hyperparathyroidism: Secondary | ICD-10-CM

## 2021-10-16 DIAGNOSIS — Z780 Asymptomatic menopausal state: Secondary | ICD-10-CM | POA: Diagnosis not present

## 2021-10-16 DIAGNOSIS — M810 Age-related osteoporosis without current pathological fracture: Secondary | ICD-10-CM | POA: Diagnosis not present

## 2021-10-16 MED ORDER — ALENDRONATE SODIUM 70 MG PO TABS: 70.0000 mg | ORAL_TABLET | ORAL | 3 refills | Status: DC

## 2021-11-05 NOTE — Progress Notes (Signed)
? ?Tracy Rowe is a 76 y.o. female who presents for annual wellness visit and follow-up on chronic medical conditions.   ? ?She has the following concerns: none ? ?Eats 2 meals a day; drinks 4 - 5 bottles of water a day; occasionally walks for exercise; sleeps 6 - 7 hours at night ? ?Immunizations and Health Maintenance ?Immunization History  ?Administered Date(s) Administered  ? Fluad Quad(high Dose 65+) 05/03/2020, 06/26/2021  ? Influenza, High Dose Seasonal PF 10/23/2016, 05/22/2017, 06/14/2018, 05/18/2019  ? Influenza-Unspecified 05/18/2019  ? PFIZER(Purple Top)SARS-COV-2 Vaccination 10/22/2019, 11/19/2019, 07/02/2020  ? Pension scheme manager 40yr & up 07/31/2021  ? Pneumococcal Conjugate-13 10/23/2016  ? Pneumococcal Polysaccharide-23 10/29/2018  ? Tdap 10/30/2016  ? ?Health Maintenance  ?Topic Date Due  ? Zoster Vaccines- Shingrix (1 of 2) 02/07/2022 (Originally 03/06/1965)  ? TETANUS/TDAP  10/31/2026  ? Pneumonia Vaccine 78 Years old  Completed  ? INFLUENZA VACCINE  Completed  ? DEXA SCAN  Completed  ? COVID-19 Vaccine  Completed  ? Hepatitis C Screening  Completed  ? HPV VACCINES  Aged Out  ? Fecal DNA (Cologuard)  Discontinued  ?  ? ?Patient Care Team: ?WMarcellina Millinas PCP - General (Physician Assistant) ?GPhilemon Kingdom MD as Consulting Physician (Internal Medicine) ? ? ?Advanced directives: ?Does Patient Have a Medical Advance Directive?: Yes ?Does patient want to make changes to medical advance directive?: No - Patient declined ? ?Depression screen:  See questionnaire below.  ?[facility]/07/2021 01/02/2021 10/31/2020 05/03/2020 10/31/2019  ?Decreased Interest 0 0 0 0 0  ?Down, Depressed, Hopeless 0 0 0 0 0  ?PHQ - 2 Score 0 0 0 0 0  ? ? ?Fall Risk Screen: see questionnaire below. ?Fall Risk  11/07/2021 01/02/2021 10/31/2020 05/03/2020 10/31/2019  ?Falls in the past year? 0 0 0 0 0  ?Number falls in past yr: 0 0 0 0 0  ?Injury with Fall? 0 0 0 0 0  ?Risk for fall  due to : No Fall Risks No Fall Risks No Fall Risks - -  ?Follow up Falls evaluation completed Falls evaluation completed Falls evaluation completed - -  ? ? ?ADL screen:  See questionnaire below ?Functional Status Survey: ? ?The 10-year ASCVD risk score (Arnett DK, et al., 2019) is: 12% ?  Values used to calculate the score: ?    Age: [age]years ?    Sex: Female ?    Is Non-Hispanic African American: Yes ?    Diabetic: No ?    Tobacco smoker: No ?    Systolic Blood Pressure: 1992mmHg ?    Is BP treated: Yes ?    HDL Cholesterol: 39 mg/dL ?    Total Cholesterol: 144 mg/dL  ?  ? ? ?Review of Systems  ?Constitutional:  Negative for activity change and fever.  ?HENT:  Negative for congestion, ear pain and voice change.   ?Eyes:  Negative for redness.  ?Respiratory:  Negative for cough.   ?Cardiovascular:  Negative for chest pain.  ?Gastrointestinal:  Negative for constipation and diarrhea.  ?Endocrine: Negative for polyuria.  ?Genitourinary:  Negative for flank pain.  ?Musculoskeletal:  Negative for gait problem and neck stiffness.  ?Skin:  Negative for color change and rash.  ?Neurological:  Negative for dizziness.  ?Hematological:  Negative for adenopathy.  ?Psychiatric/Behavioral:  Negative for agitation, behavioral problems and confusion.   ? ?BP 140/90   Pulse 76   Ht '5\' 9"'$  (1.753 m)  Wt 181 lb (82.1 kg)   SpO2 99%   BMI 26.73 kg/m?  ? ?Physical Exam ?Vitals and nursing note reviewed.  ?Constitutional:   ?   General: She is not in acute distress. ?   Appearance: Normal appearance. She is not ill-appearing.  ?HENT:  ?   Head: Normocephalic and atraumatic.  ?   Right Ear: External ear normal.  ?   Left Ear: External ear normal.  ?   Nose: No congestion.  ?Eyes:  ?   Extraocular Movements: Extraocular movements intact.  ?   Conjunctiva/sclera: Conjunctivae normal.  ?   Pupils: Pupils are equal, round, and reactive to light.  ?Cardiovascular:  ?   Rate and Rhythm: Normal rate and regular rhythm.  ?   Pulses: Normal  pulses.  ?   Heart sounds: Normal heart sounds.  ?Pulmonary:  ?   Effort: Pulmonary effort is normal.  ?   Breath sounds: Normal breath sounds. No wheezing.  ?Abdominal:  ?   General: Bowel sounds are normal.  ?   Palpations: Abdomen is soft.  ?Musculoskeletal:  ?   Cervical back: Normal range of motion and neck supple.  ?   Right lower leg: No edema.  ?   Left lower leg: No edema.  ?Skin: ?   General: Skin is warm and dry.  ?   Findings: No bruising.  ?Neurological:  ?   General: No focal deficit present.  ?   Mental Status: She is alert and oriented to person, place, and time.  ?Psychiatric:     ?   Mood and Affect: Mood normal.     ?   Behavior: Behavior normal.     ?   Thought Content: Thought content normal.  ? ? ?ASSESSMENT/PLAN ? ?Problem List Items Addressed This Visit   ? ?  ? Cardiovascular and Mediastinum  ? Aortic atherosclerosis ([place])  ?  Stable, is on a statin, diagnosed 06/28/2019, lipid panel drawn today ?  ?  ? Relevant Medications  ? atorvastatin (LIPITOR) 40 MG tablet  ? furosemide (LASIX) 20 MG tablet  ? lisinopril (ZESTRIL) 40 MG tablet  ? Primary hypertension  ?  controlled, continue lisinopril 40 mg daily, eat a low salt diet, do not add any salt to food when cooking, avoid processed foods, avoid fried foods ? ?  ?  ? Relevant Medications  ? atorvastatin (LIPITOR) 40 MG tablet  ? furosemide (LASIX) 20 MG tablet  ? lisinopril (ZESTRIL) 40 MG tablet  ? Other Relevant Orders  ? CBC with Differential/Platelet  ? Comprehensive metabolic panel  ? Lipid panel  ?  ? Respiratory  ? Non-seasonal allergic rhinitis  ?  Stable, continue Flonase nasal spray and OTC antihistamines as needed ?  ?  ? Relevant Orders  ? CBC with Differential/Platelet  ? Comprehensive metabolic panel  ? Lipid panel  ?  ? Digestive  ? Chronic constipation  ?  controlled, continue a high fiber diet, drink 8 - 10 glasses of water daily,  ?  ?  ? Relevant Orders  ? CBC with Differential/Platelet  ? Comprehensive metabolic panel  ?  Lipid panel  ?  ? Endocrine  ? Primary hyperparathyroidism ([place])  ?  Stable, continue current management, followed by Endocrinology [name], MD ?  ?  ?  ? Musculoskeletal and Integument  ? Osteopenia  ?  stable, continue current management, followed by Endocrinology [name], MD, is on Fosamax, last dexa bd 10/17/2021 ? ?  ?  ?  ?  Other  ? Vitamin D deficiency  ?  Stable, continue current management, followed by Endocrinology [name], MD, 07/23/2021 Vitamin D level 44.40 ? ?  ?  ? Elevated serum creatinine  ?  stable, will monitor, CMP drawn today, avoid nephrotoxic medicines like NSAIDS (Advil, Motrin, Ibuprofen, Aleve, Naproxen) ? ?  ?  ? Relevant Orders  ? Comprehensive metabolic panel  ? Mixed hyperlipidemia  ? Relevant Medications  ? atorvastatin (LIPITOR) 40 MG tablet  ? furosemide (LASIX) 20 MG tablet  ? lisinopril (ZESTRIL) 40 MG tablet  ? Other Relevant Orders  ? Lipid panel  ? ?Other Visit Diagnoses   ? ? Medicare annual wellness visit, subsequent    -  Primary  ? Relevant Orders  ? CBC with Differential/Platelet  ? Comprehensive metabolic panel  ? Lipid panel  ? ?  ? ? ?Discussed monthly self breast exams and yearly mammograms; at least 30 minutes of aerobic activity at least 5 days/week and weight-bearing exercise 2x/week; proper sunscreen use reviewed; healthy diet, including goals of calcium and vitamin D intake and alcohol recommendations (less than or equal to 1 drink/day) reviewed; regular seatbelt use; changing batteries in smoke detectors.  Immunization recommendations discussed.  Colonoscopy recommendations reviewed ? ? ?Medicare Attestation ?I have personally reviewed: ?The patient's medical and social history ?Their use of alcohol, tobacco or illicit drugs ?Their current medications and supplements ?The patient's functional ability including ADLs,fall risks, home safety risks, cognitive, and hearing and visual impairment ?Diet and physical activities ?Evidence for  depression or mood disorders ? ?The patient's weight, height, and BMI have been recorded in the chart.  I have made referrals, counseling, and provided education to the patient based on review of the above and I

## 2021-11-06 ENCOUNTER — Ambulatory Visit: Payer: Medicare PPO | Admitting: Family Medicine

## 2021-11-07 ENCOUNTER — Other Ambulatory Visit: Payer: Self-pay

## 2021-11-07 ENCOUNTER — Encounter: Payer: Self-pay | Admitting: Physician Assistant

## 2021-11-07 ENCOUNTER — Ambulatory Visit: Payer: Medicare PPO | Admitting: Physician Assistant

## 2021-11-07 VITALS — BP 140/90 | HR 76 | Ht 69.0 in | Wt 181.0 lb

## 2021-11-07 DIAGNOSIS — E210 Primary hyperparathyroidism: Secondary | ICD-10-CM | POA: Diagnosis not present

## 2021-11-07 DIAGNOSIS — J3089 Other allergic rhinitis: Secondary | ICD-10-CM

## 2021-11-07 DIAGNOSIS — Z0000 Encounter for general adult medical examination without abnormal findings: Secondary | ICD-10-CM

## 2021-11-07 DIAGNOSIS — E559 Vitamin D deficiency, unspecified: Secondary | ICD-10-CM

## 2021-11-07 DIAGNOSIS — E782 Mixed hyperlipidemia: Secondary | ICD-10-CM | POA: Diagnosis not present

## 2021-11-07 DIAGNOSIS — I10 Essential (primary) hypertension: Secondary | ICD-10-CM | POA: Diagnosis not present

## 2021-11-07 DIAGNOSIS — I700 Atherosclerosis of aorta: Secondary | ICD-10-CM

## 2021-11-07 DIAGNOSIS — M8580 Other specified disorders of bone density and structure, unspecified site: Secondary | ICD-10-CM

## 2021-11-07 DIAGNOSIS — R7989 Other specified abnormal findings of blood chemistry: Secondary | ICD-10-CM | POA: Diagnosis not present

## 2021-11-07 DIAGNOSIS — K5909 Other constipation: Secondary | ICD-10-CM | POA: Diagnosis not present

## 2021-11-07 MED ORDER — FUROSEMIDE 20 MG PO TABS: 20.0000 mg | ORAL_TABLET | Freq: Every day | ORAL | 1 refills | Status: DC

## 2021-11-07 MED ORDER — ATORVASTATIN CALCIUM 40 MG PO TABS: 40.0000 mg | ORAL_TABLET | Freq: Every day | ORAL | 1 refills | Status: DC

## 2021-11-07 MED ORDER — LISINOPRIL 40 MG PO TABS: 40.0000 mg | ORAL_TABLET | Freq: Every day | ORAL | 2 refills | Status: DC

## 2021-11-07 NOTE — Assessment & Plan Note (Signed)
Stable, is on a statin, diagnosed 06/28/2019, lipid panel drawn today ?

## 2021-11-07 NOTE — Assessment & Plan Note (Signed)
controlled, continue lisinopril 40 mg daily, eat a low salt diet, do not add any salt to food when cooking, avoid processed foods, avoid fried foods ? ?

## 2021-11-07 NOTE — Assessment & Plan Note (Signed)
Stable, continue Flonase nasal spray and OTC antihistamines as needed ?

## 2021-11-07 NOTE — Assessment & Plan Note (Signed)
Stable, continue current management, followed by Endocrinology [name], MD ?

## 2021-11-07 NOTE — Assessment & Plan Note (Signed)
controlled, continue a high fiber diet, drink 8 - 10 glasses of water daily,  ?

## 2021-11-07 NOTE — Assessment & Plan Note (Addendum)
Stable, continue current management, followed by Endocrinology [name], MD, 07/23/2021 Vitamin D level 44.40 ? ?

## 2021-11-07 NOTE — Assessment & Plan Note (Signed)
stable, will monitor, CMP drawn today, avoid nephrotoxic medicines like NSAIDS (Advil, Motrin, Ibuprofen, Aleve, Naproxen) ? ?

## 2021-11-07 NOTE — Patient Instructions (Signed)

## 2021-11-07 NOTE — Assessment & Plan Note (Addendum)
stable, continue current management, followed by Endocrinology [name], MD, is on Fosamax, last dexa bd 10/17/2021 ? ?

## 2021-11-08 LAB — CBC WITH DIFFERENTIAL/PLATELET
Basophils Absolute: 0 10*3/uL (ref 0.0–0.2)
Basos: 1 %
EOS (ABSOLUTE): 0.1 10*3/uL (ref 0.0–0.4)
Eos: 2 %
Hematocrit: 40.1 % (ref 34.0–46.6)
Hemoglobin: 13.2 g/dL (ref 11.1–15.9)
Immature Grans (Abs): 0 10*3/uL (ref 0.0–0.1)
Immature Granulocytes: 0 %
Lymphocytes Absolute: 1.4 10*3/uL (ref 0.7–3.1)
Lymphs: 32 %
MCH: 27.1 pg (ref 26.6–33.0)
MCHC: 32.9 g/dL (ref 31.5–35.7)
MCV: 82 fL (ref 79–97)
Monocytes Absolute: 0.3 10*3/uL (ref 0.1–0.9)
Monocytes: 6 %
Neutrophils Absolute: 2.7 10*3/uL (ref 1.4–7.0)
Neutrophils: 59 %
Platelets: 194 10*3/uL (ref 150–450)
RBC: 4.87 x10E6/uL (ref 3.77–5.28)
RDW: 13.2 % (ref 11.7–15.4)
WBC: 4.5 10*3/uL (ref 3.4–10.8)

## 2021-11-08 LAB — COMPREHENSIVE METABOLIC PANEL
ALT: 8 IU/L (ref 0–32)
AST: 18 IU/L (ref 0–40)
Albumin/Globulin Ratio: 1.8 (ref 1.2–2.2)
Albumin: 4.5 g/dL (ref 3.7–4.7)
Alkaline Phosphatase: 72 IU/L (ref 44–121)
BUN/Creatinine Ratio: 15 (ref 12–28)
BUN: 15 mg/dL (ref 8–27)
Bilirubin Total: 1.1 mg/dL (ref 0.0–1.2)
CO2: 26 mmol/L (ref 20–29)
Calcium: 9.4 mg/dL (ref 8.7–10.3)
Chloride: 105 mmol/L (ref 96–106)
Creatinine, Ser: 1.03 mg/dL — ABNORMAL HIGH (ref 0.57–1.00)
Globulin, Total: 2.5 g/dL (ref 1.5–4.5)
Glucose: 92 mg/dL (ref 70–99)
Potassium: 3.9 mmol/L (ref 3.5–5.2)
Sodium: 145 mmol/L — ABNORMAL HIGH (ref 134–144)
Total Protein: 7 g/dL (ref 6.0–8.5)
eGFR: 56 mL/min/{1.73_m2} — ABNORMAL LOW (ref >59)

## 2021-11-08 LAB — LIPID PANEL
Chol/HDL Ratio: 3.4 ratio (ref 0.0–4.4)
Cholesterol, Total: 150 mg/dL (ref 100–199)
HDL: 44 mg/dL (ref >39)
LDL Chol Calc (NIH): 90 mg/dL (ref 0–99)
Triglycerides: 81 mg/dL (ref 0–149)
VLDL Cholesterol Cal: 16 mg/dL (ref 5–40)

## 2021-11-08 NOTE — Progress Notes (Signed)
Please call patient to tell her-  ? ?Regarding your recent blood work: ? ?Blood counts are fine, no anemia ? ?Liver and kidneys are fine ? ?Cholesterol levels are fine; continue to eat a low fat diet and avoid fried and processed foods ?

## 2022-01-07 ENCOUNTER — Other Ambulatory Visit: Payer: Self-pay | Admitting: Family Medicine

## 2022-01-07 DIAGNOSIS — I10 Essential (primary) hypertension: Secondary | ICD-10-CM

## 2022-01-09 ENCOUNTER — Other Ambulatory Visit: Payer: Self-pay | Admitting: Physician Assistant

## 2022-01-09 DIAGNOSIS — Z1231 Encounter for screening mammogram for malignant neoplasm of breast: Secondary | ICD-10-CM

## 2022-01-28 ENCOUNTER — Ambulatory Visit
Admission: RE | Admit: 2022-01-28 | Discharge: 2022-01-28 | Disposition: A | Discharge status: Home / Self Care | Payer: Medicare PPO | Source: Ambulatory Visit | Attending: Physician Assistant | Admitting: Physician Assistant

## 2022-01-28 DIAGNOSIS — Z1231 Encounter for screening mammogram for malignant neoplasm of breast: Secondary | ICD-10-CM | POA: Diagnosis not present

## 2022-02-21 ENCOUNTER — Other Ambulatory Visit: Payer: Self-pay | Admitting: Internal Medicine

## 2022-04-23 ENCOUNTER — Other Ambulatory Visit: Payer: Self-pay | Admitting: Physician Assistant

## 2022-05-12 ENCOUNTER — Ambulatory Visit (INDEPENDENT_AMBULATORY_CARE_PROVIDER_SITE_OTHER): Payer: Medicare HMO | Admitting: Medical

## 2022-05-12 VITALS — BP 122/80 | HR 73 | Wt 185.8 lb

## 2022-05-12 DIAGNOSIS — M810 Age-related osteoporosis without current pathological fracture: Secondary | ICD-10-CM | POA: Diagnosis not present

## 2022-05-12 DIAGNOSIS — N1831 Chronic kidney disease, stage 3a: Secondary | ICD-10-CM | POA: Diagnosis not present

## 2022-05-12 DIAGNOSIS — Z23 Encounter for immunization: Secondary | ICD-10-CM

## 2022-05-12 DIAGNOSIS — E210 Primary hyperparathyroidism: Secondary | ICD-10-CM | POA: Diagnosis not present

## 2022-05-12 DIAGNOSIS — I10 Essential (primary) hypertension: Secondary | ICD-10-CM | POA: Diagnosis not present

## 2022-05-12 DIAGNOSIS — R7989 Other specified abnormal findings of blood chemistry: Secondary | ICD-10-CM

## 2022-05-12 DIAGNOSIS — E782 Mixed hyperlipidemia: Secondary | ICD-10-CM

## 2022-05-12 DIAGNOSIS — E041 Nontoxic single thyroid nodule: Secondary | ICD-10-CM | POA: Diagnosis not present

## 2022-05-12 DIAGNOSIS — I700 Atherosclerosis of aorta: Secondary | ICD-10-CM | POA: Diagnosis not present

## 2022-05-12 NOTE — Progress Notes (Signed)
Subjective:  Tracy Rowe is a 76 y.o. female who presents for Chief Complaint  Patient presents with   med check    Med check, no concerns, flu shot today      Medical team: Dr. [name], endocrinology Dr. Landis Martins, podiatry   Here for med check.  No particular concerns today  Wants flu shot today  Here for routine med check.   this is my first visit with her.  Chart history she has history of vitamin D deficiency, hypertension, hyperlipidemia, chronic constipation, prior elevated creatinine, history of thyroid nodule.  Hypertension-compliant with medication without complaint.  She takes Lasix and lisinopril.  She does not recall having problems with edema in the past.  No history of CHF.  She states that she is on Lasix for high blood pressure.  Water intake could be better.  She does not drink likely 60 ounces or more daily.  Thyroid disease-she is compliant with levothyroxine daily 50 mcg daily  Hyperlipidemia-compliant with atorvastatin daily  Osteoporosis-Fosamax was started earlier this year.  She takes this without issue.  Doing fine on this medication.  She is not getting weightbearing exercise  No other aggravating or relieving factors.    No other c/o.  The following portions of the patient's history were reviewed and updated as appropriate: allergies, current medications, past family history, past medical history, past social history, past surgical history and problem list.  ROS Otherwise as in subjective above  Objective: BP 122/80   Pulse 73   Wt 185 lb 12.8 oz (84.3 kg)   BMI 27.44 kg/m   General appearance: alert, no distress, well developed, well nourished Neck: supple, no lymphadenopathy, no thyromegaly, no masses Heart: RRR, normal S1, S2, no murmurs Lungs: CTA bilaterally, no wheezes, rhonchi, or rales Pulses: 2+ radial pulses, 2+ pedal pulses, normal cap refill Ext: no edema   Assessment: Encounter Diagnoses  Name Primary?    Primary hypertension    Stage 3a chronic kidney disease (HCC)    Needs flu shot Yes   Aortic atherosclerosis (HCC)    Mixed hyperlipidemia    Abnormal thyroid blood test    Osteoporosis, unspecified osteoporosis type, unspecified pathological fracture presence    Primary hyperparathyroidism ([place])      Plan: Hypertension-controlled on current medication, lisinopril 40 mg and Lasix 20 mg daily  CKD 3-looking back over her chart, this chart record shows what appears to be a CKD 3A for at least 2 years.  She likely is due to hypertension.  I counseled on maybe trying to get more water intake.  We discussed risk and benefits of her medications.  Consider lowering her Lasix dose changing to a different medication if kidney marker looks any different today.  No appreciable swelling today.  No history of CHF.  I reviewed endocrinology notes from 06/2021.  She has postsurgical hypothyroidism diagnosed 2022.   Hx/o primary hyperparathyroidism diagnosed 2018, and had scan positive for parathyroid adenoma 2022.  Atherosclerosis, hyperlipidemia-continue statin and low-cholesterol diet  Osteoporosis-I reviewed her bone density test from February 2023 which was abnormal.  She is compliant with Fosamax started earlier this year  Counseled on the influenza virus vaccine.  Vaccine information sheet given.   High dose Influenza vaccine given after consent obtained.  Valary was seen today for med check.  Diagnoses and all orders for this visit:  Needs flu shot -     Flu Vaccine QUAD High Dose(Fluad)  Primary hypertension  Stage 3a chronic kidney disease (  [place]) -     Basic metabolic panel  Aortic atherosclerosis (HCC)  Mixed hyperlipidemia  Abnormal thyroid blood test -     TSH + free T4 -     PTH, Intact and Calcium  Osteoporosis, unspecified osteoporosis type, unspecified pathological fracture presence  Primary hyperparathyroidism ([place])    Follow up: pending labs

## 2022-05-13 ENCOUNTER — Other Ambulatory Visit: Payer: Self-pay | Admitting: Medical

## 2022-05-13 DIAGNOSIS — N1831 Chronic kidney disease, stage 3a: Secondary | ICD-10-CM

## 2022-05-13 LAB — BASIC METABOLIC PANEL
BUN/Creatinine Ratio: 13 (ref 12–28)
BUN: 15 mg/dL (ref 8–27)
CO2: 21 mmol/L (ref 20–29)
Calcium: 9.3 mg/dL (ref 8.7–10.3)
Chloride: 107 mmol/L — ABNORMAL HIGH (ref 96–106)
Creatinine, Ser: 1.2 mg/dL — ABNORMAL HIGH (ref 0.57–1.00)
Glucose: 94 mg/dL (ref 70–99)
Potassium: 4 mmol/L (ref 3.5–5.2)
Sodium: 142 mmol/L (ref 134–144)
eGFR: 46 mL/min/{1.73_m2} — ABNORMAL LOW (ref >59)

## 2022-05-13 LAB — TSH+FREE T4
Free T4: 1.42 ng/dL (ref 0.82–1.77)
TSH: 1.49 u[IU]/mL (ref 0.450–4.500)

## 2022-05-13 LAB — PTH, INTACT AND CALCIUM: PTH: 44 pg/mL (ref 15–65)

## 2022-05-13 NOTE — Progress Notes (Signed)
Bmet 

## 2022-05-27 ENCOUNTER — Other Ambulatory Visit: Payer: Medicare HMO

## 2022-05-27 DIAGNOSIS — N1831 Chronic kidney disease, stage 3a: Secondary | ICD-10-CM

## 2022-05-28 LAB — BASIC METABOLIC PANEL
BUN/Creatinine Ratio: 9 — ABNORMAL LOW (ref 12–28)
BUN: 11 mg/dL (ref 8–27)
CO2: 26 mmol/L (ref 20–29)
Calcium: 9.6 mg/dL (ref 8.7–10.3)
Chloride: 107 mmol/L — ABNORMAL HIGH (ref 96–106)
Creatinine, Ser: 1.19 mg/dL — ABNORMAL HIGH (ref 0.57–1.00)
Glucose: 104 mg/dL — ABNORMAL HIGH (ref 70–99)
Potassium: 4.3 mmol/L (ref 3.5–5.2)
Sodium: 145 mmol/L — ABNORMAL HIGH (ref 134–144)
eGFR: 47 mL/min/{1.73_m2} — ABNORMAL LOW (ref >59)

## 2022-06-30 ENCOUNTER — Ambulatory Visit (INDEPENDENT_AMBULATORY_CARE_PROVIDER_SITE_OTHER): Payer: Medicare HMO | Admitting: Medical

## 2022-06-30 VITALS — BP 110/70 | HR 77 | Wt 189.2 lb

## 2022-06-30 DIAGNOSIS — I700 Atherosclerosis of aorta: Secondary | ICD-10-CM

## 2022-06-30 DIAGNOSIS — E782 Mixed hyperlipidemia: Secondary | ICD-10-CM | POA: Diagnosis not present

## 2022-06-30 DIAGNOSIS — I10 Essential (primary) hypertension: Secondary | ICD-10-CM | POA: Diagnosis not present

## 2022-06-30 DIAGNOSIS — N1831 Chronic kidney disease, stage 3a: Secondary | ICD-10-CM

## 2022-06-30 DIAGNOSIS — E210 Primary hyperparathyroidism: Secondary | ICD-10-CM

## 2022-06-30 NOTE — Progress Notes (Signed)
Subjective:  Tracy Rowe is a 76 y.o. female who presents for Chief Complaint  Patient presents with   1 month follow-up    1 month follow-up on bp, hasn't been check BP     Medical team: Dr. [name], endocrinology Dr. Landis Martins, podiatry No current nephrology and cardiology  Here for med check.   Last visit we cut her Lasix back to see how she would tolerate this since she was urinating a lot and her blood pressure look good without any edema.  Since last visit she has had no edema issues, she is urinating less thankfully.  Feels fine otherwise.  So currently for blood pressure she is using lisinopril 40 mg daily, Lasix 20 mg 1/2 tablet daily.  Last cardiology visit was probably more than 10 years ago with Dr. Christin Fudge.  Walks some for exercise, yard work.    Compliant with her mother medicines as usual  No other aggravating or relieving factors.    No other c/o.  Past Medical History:  Diagnosis Date   Aortic atherosclerosis ([place]) 06/28/2019   HTN (hypertension)    Hyperlipidemia    Hypertension 09/25/2016   Osteoporosis    Current Outpatient Medications on File Prior to Visit  Medication Sig Dispense Refill   alendronate (FOSAMAX) 70 MG tablet Take 1 tablet (70 mg total) by mouth once a week. Take with a full glass of water on an empty stomach. 12 tablet 3   atorvastatin (LIPITOR) 40 MG tablet Take 1 tablet (40 mg total) by mouth daily. 90 tablet 1   fluticasone (FLONASE) 50 MCG/ACT nasal spray Place 2 sprays into both nostrils daily. 16 g 1   furosemide (LASIX) 20 MG tablet Take 1 tablet by mouth once daily 90 tablet 0   levothyroxine (SYNTHROID) 50 MCG tablet Take 1 tablet by mouth once daily 90 tablet 1   lisinopril (ZESTRIL) 40 MG tablet Take 1 tablet (40 mg total) by mouth daily. 90 tablet 2   polyethylene glycol powder (GLYCOLAX/MIRALAX) powder Take 17 g by mouth 2 (two) times daily as needed. (Patient taking differently: Take 17 g by mouth 2 (two)  times daily as needed for mild constipation.) 3350 g 1   Vitamin D, Cholecalciferol, 1000 units CAPS Take 2,000 Units by mouth daily.      No current facility-administered medications on file prior to visit.     The following portions of the patient's history were reviewed and updated as appropriate: allergies, current medications, past family history, past medical history, past social history, past surgical history and problem list.  ROS Otherwise as in subjective above     Objective: BP 110/70   Pulse 77   Wt 189 lb 3.2 oz (85.8 kg)   BMI 27.94 kg/m   Wt Readings from Last 3 Encounters:  06/30/22 189 lb 3.2 oz (85.8 kg)  05/12/22 185 lb 12.8 oz (84.3 kg)  11/07/21 181 lb (82.1 kg)   BP Readings from Last 3 Encounters:  06/30/22 110/70  05/12/22 122/80  11/07/21 140/90   General appearance: alert, no distress, well developed, well nourished Neck: supple, no lymphadenopathy, no thyromegaly, no masses Heart: RRR, normal S1, S2, no murmurs Lungs: CTA bilaterally, no wheezes, rhonchi, or rales Pulses: 2+ radial pulses, 2+ pedal pulses, normal cap refill Ext: no edema   Assessment: Encounter Diagnoses  Name Primary?   Primary hypertension Yes   Aortic atherosclerosis ([place])    Primary hyperparathyroidism (HCC)    Stage 3a chronic kidney disease (  [place])    Mixed hyperlipidemia       Plan: Hypertension-controlled on current medication, lisinopril 40 mg and Lasix 20 mg, 1/2 tablet daily since last visit  CKD 3-looking back over her chart, this chart record shows what appears to be a CKD 3A for at least 2 years.  No appreciable swelling today.  No history of CHF.  Labs today since she has changed to half tab of Lasix 20 mg daily  I reviewed endocrinology notes from 06/2021.  She has postsurgical hypothyroidism diagnosed 2022.   Hx/o primary hyperparathyroidism diagnosed 2018, and had scan positive for parathyroid adenoma 2022.  Atherosclerosis, hyperlipidemia-continue  statin and low-cholesterol diet  Ricardo was seen today for 1 month follow-up.  Diagnoses and all orders for this visit:  Primary hypertension -     Basic metabolic panel  Aortic atherosclerosis ([place])  Primary hyperparathyroidism ([place])  Stage 3a chronic kidney disease ([place])  Mixed hyperlipidemia     Follow up: pending labs

## 2022-07-01 LAB — BASIC METABOLIC PANEL
BUN/Creatinine Ratio: 15 (ref 12–28)
BUN: 18 mg/dL (ref 8–27)
CO2: 23 mmol/L (ref 20–29)
Calcium: 9.8 mg/dL (ref 8.7–10.3)
Chloride: 105 mmol/L (ref 96–106)
Creatinine, Ser: 1.23 mg/dL — ABNORMAL HIGH (ref 0.57–1.00)
Glucose: 88 mg/dL (ref 70–99)
Potassium: 4.4 mmol/L (ref 3.5–5.2)
Sodium: 144 mmol/L (ref 134–144)
eGFR: 45 mL/min/{1.73_m2} — ABNORMAL LOW (ref >59)

## 2022-07-02 ENCOUNTER — Other Ambulatory Visit: Payer: Self-pay | Admitting: Medical

## 2022-07-02 DIAGNOSIS — I10 Essential (primary) hypertension: Secondary | ICD-10-CM

## 2022-07-02 MED ORDER — ATORVASTATIN CALCIUM 40 MG PO TABS: 40.0000 mg | ORAL_TABLET | Freq: Every day | ORAL | 1 refills | Status: DC

## 2022-07-02 MED ORDER — FUROSEMIDE 20 MG PO TABS: 20.0000 mg | ORAL_TABLET | Freq: Every day | ORAL | 0 refills | Status: DC

## 2022-07-02 MED ORDER — FUROSEMIDE 20 MG PO TABS: 10.0000 mg | ORAL_TABLET | Freq: Every day | ORAL | 0 refills | Status: DC

## 2022-07-02 MED ORDER — LISINOPRIL 40 MG PO TABS: 40.0000 mg | ORAL_TABLET | Freq: Every day | ORAL | 2 refills | Status: DC

## 2022-07-15 ENCOUNTER — Other Ambulatory Visit (INDEPENDENT_AMBULATORY_CARE_PROVIDER_SITE_OTHER): Payer: Medicare HMO

## 2022-07-15 DIAGNOSIS — Z23 Encounter for immunization: Secondary | ICD-10-CM

## 2022-07-23 ENCOUNTER — Encounter: Payer: Self-pay | Admitting: Internal Medicine

## 2022-07-23 ENCOUNTER — Ambulatory Visit: Payer: Medicare HMO | Admitting: Internal Medicine

## 2022-07-23 VITALS — BP 150/100 | HR 76 | Ht 69.0 in | Wt 188.4 lb

## 2022-07-23 DIAGNOSIS — E890 Postprocedural hypothyroidism: Secondary | ICD-10-CM

## 2022-07-23 DIAGNOSIS — M8580 Other specified disorders of bone density and structure, unspecified site: Secondary | ICD-10-CM | POA: Diagnosis not present

## 2022-07-23 DIAGNOSIS — E041 Nontoxic single thyroid nodule: Secondary | ICD-10-CM | POA: Diagnosis not present

## 2022-07-23 DIAGNOSIS — E210 Primary hyperparathyroidism: Secondary | ICD-10-CM | POA: Diagnosis not present

## 2022-07-23 DIAGNOSIS — E559 Vitamin D deficiency, unspecified: Secondary | ICD-10-CM

## 2022-07-23 LAB — VITAMIN D 25 HYDROXY (VIT D DEFICIENCY, FRACTURES): VITD: 53.74 ng/mL (ref 30.00–100.00)

## 2022-07-23 NOTE — Patient Instructions (Addendum)
Please continue Levothyroxine 50 mcg daily.  Take the thyroid hormone every day, with water, at least 30 minutes before breakfast, separated by at least 4 hours from: - acid reflux medications - calcium - iron - multivitamins  Please stop at the lab.  Continue vitamin D 2000 units daily.  Please come back for a follow-up appointment in 1 year.

## 2022-07-23 NOTE — Progress Notes (Unsigned)
Patient ID: Tracy Rowe, female   DOB: [birth year], 76 y.o.   MRN: [id number]   HPI  Tracy Rowe is a 76 y.o. female, returning for follow-up for h/o primary hyperparathyroidism, vitamin D deficiency, osteopenia, thyroid nodule, and now postsurgical hypothyroidism diagnosed after last visit. Last visit 1 year ago.  Interim history: No falls or fractures since last visit. She has R sciatica.  She wakes up frequently during the night. She otherwise has no complaints today.  Primary hyperparathyroidism: She was diagnosed with hypercalcemia in 09/2016 but she had a technetium sestamibi scan that was positive for parathyroid adenoma in 2002.  Reviewed pertinent labs: Lab Results  Component Value Date   PTH 44 05/12/2022   PTH Comment 05/12/2022   PTH 52 12/23/2017   PTH Comment 12/23/2017   PTH 95 (H) 08/14/2017   PTH Comment 08/14/2017   PTH 55 03/02/2017   PTH 142 (H) 11/26/2016   CALCIUM 9.8 06/30/2022   CALCIUM 9.6 05/27/2022   CALCIUM 9.3 05/12/2022   CALCIUM 9.4 11/07/2021   CALCIUM 9.5 05/06/2021   CALCIUM 9.3 11/08/2020   CALCIUM 9.5 10/31/2020   CALCIUM 9.4 05/03/2020   CALCIUM 9.5 12/06/2019   CALCIUM 9.7 11/10/2019   She had an indeterminate Tc sestamibi scan (2002): NO DEFINITE PARATHYROID ADENOPATHY LOCALIZED IN THE NECK OR CHEST ALTHOUGH THERE IS A FOCUS OF MILDLY PROMINENT ACTIVITY AT THE INFERIOR POLE OF THE LEFT LOBE OF THE THYROID WHICH WOULD BE THE MOST LIKELY SITE ON THIS SCAN.  Other labs reviewed: Component     Latest Ref Rng & Units 03/02/2017  Vitamin D 1, 25 (OH) Total     18 - 72 pg/mL 68  Vitamin D3 1, 25 (OH)     pg/mL 44  Vitamin D2 1, 25 (OH)     pg/mL 24  Phosphorus     2.3 - 4.6 mg/dL 2.4  Magnesium     1.5 - 2.5 mg/dL 1.8   Component     Latest Ref Rng & Units 03/04/2017  Creatinine, Urine     20 - 320 mg/dL 63  Creatinine, 24H Ur     0.63 - 2.50 g/24 h 0.50 (L)  Calcium, Ur     Not estab mg/dL 3  Calcium, 24 hour urine      35 - 250 mg/24 h 24 (L)  Urinary calcium was low, but the creatinine was also low.  I did not ask her to repeat a collection, since even a higher creatinine level will most likely not push her over the limit for hypercalciuria.    She had the following investigations:  Technetium sestamibi parathyroid scan (05/05/2017): Left inferior parathyroid adenoma and a possible right superior parathyroid adenoma versus thyroid nodule.   Thyroid ultrasound (05/20/2017): Several nodules, of which one right superior measuring 1.6 x 1.3 x 1.3, solid, hypoechoic; another left inferior 1.9 x 1.3 x 1.3 cm, mixed, hypoechoic, presumed to be an enlarged parathyroid gland  FNA of the 1.6 cm nodule (07/08/2017): Scant epithelium (Bethesda category 1)  She had parathyroidectomy + right lobectomy on 09/03/2017.  Left inferior parathyroid adenoma was resected.  The right superior parathyroid did not contain an adenoma.  R Thyroid nodule was benign.  Diagnosis 1. Parathyroid gland, left inferior - HYPERCELLULAR PARATHYROID TISSUE CONSISTENT WITH ADENOMA 2. Parathyroid gland, right superior - PARATHYROID TISSUE 3. Thyroid, lobectomy, right lobe - BENIGN FOLLICULAR ADENOMA (1.2 CM) The right thyroid lobe has an adenomatous lesion which has a very thin fibrous  capsule consistent with follicular adenoma. There is no evidence of capsular or vascular invasion. The surrounding thyroid parenchyma is unremarkable.  Calcium normalized after surgery.  Osteopenia: Reviewed previous DXA scan reports ([facility] ) Date L1-L4 T score FN T score  10/16/2021 L2, L3: -2.2 L1-L3 (L4): -1.7 (+2.5%) RFN: -2.3 LFN: -2.1 Overall: -2.4%  05/25/2019 L2, L3: -2.3 (+1.3%) L1-L3 (L4): -1.9 RFN: -2.1 LFN: -1.9 Overall: +10.8%*  11/19/2016 L2, L3:-2.4 RFN: -2.3 LFN: -2.3  08/09/2007 L1, L3, L4: -2.0 RFN: n/a LFN: -1.8  Per review of the chart, she was approved for Prolia.  However, after the latest results returned, we started  Fosamax 70 mg weekly.  She takes this consistently.  No side effects.  No history of kidney stones.  + Mild CKD.  Reviewed BUN/Cr: Lab Results  Component Value Date   BUN 18 06/30/2022   BUN 11 05/27/2022   BUN 15 05/12/2022   BUN 15 11/07/2021   BUN 13 05/06/2021   BUN 12 11/08/2020   BUN 12 10/31/2020   BUN 15 05/03/2020   BUN 11 12/06/2019   BUN 13 11/10/2019   Lab Results  Component Value Date   CREATININE 1.23 (H) 06/30/2022   CREATININE 1.19 (H) 05/27/2022   CREATININE 1.20 (H) 05/12/2022   CREATININE 1.03 (H) 11/07/2021   CREATININE 1.18 (H) 05/06/2021   CREATININE 1.20 (H) 11/08/2020   CREATININE 1.14 (H) 10/31/2020   CREATININE 1.14 (H) 05/03/2020   CREATININE 1.10 (H) 12/06/2019   CREATININE 1.14 (H) 11/10/2019   She was previously on HCTZ in the past but we changed to Lasix during investigation for hyperparathyroidism.  History of vitamin D deficiency:  Reviewed her vitamin D levels: Lab Results  Component Value Date   VD25OH 44.40 07/23/2021   VD25OH 37.8 05/03/2020   VD25OH 44.6 10/31/2019   VD25OH 37.23 03/01/2019   VD25OH 39.38 12/23/2017   VD25OH 38.66 08/14/2017   VD25OH 26.51 (L) 05/15/2017   VD25OH 31.30 03/02/2017   VD25OH 30 12/29/2016   VD25OH 10 (L) 10/23/2016   She takes 2000 units of vitamin D daily.  She was previously on ergocalciferol.  Pt does not have a FH of osteoporosis.  She also has HTN.  Postsurgical hypothyroidism  -Developed after her right hemithyroidectomy in 2019  In 02/2019 I advised her to start levothyroxine 25 mcg daily.  She develops hot flashes and stopped.  In 10/2019, TSH was still high so I advised her to restart the lower dose of levothyroxine. She started, but she again developed hot flashes and itching right after taking the levothyroxine tablet.  We switched to Tirosint (liquid levothyroxine) and I advised her to increase the dose to 50 mcg daily.  However, at last visit, she was only taking 12.5 mcg.  I  advised her to increase the dose to 50 mcg daily.  Since then, we had to stop Tirosint due to insurance coverage and switch to Synthroid d.a.w. . However, this was $$$ >> we switched to generic in 12/2020.  She takes the levothyroxine (not on Tirosint due to $$): - daily - in am - fasting - no coffee - at least 30 min from b'fast - no calcium - no iron - no multivitamins - no PPIs - not on Biotin - 30 min after Fosamax  Reviewed her TFTs: Lab Results  Component Value Date   TSH 1.490 05/12/2022   TSH 0.54 07/23/2021   TSH 0.47 01/03/2021   TSH 11.06 (H) 02/29/2020   TSH 10.100 (  H) 10/31/2019   TSH 11.65 (H) 03/01/2019   TSH 4.42 08/31/2018   TSH 5.00 (H) 05/31/2018   TSH 5.630 (H) 05/03/2018   TSH 4.48 12/23/2017   FREET4 1.42 05/12/2022   FREET4 1.23 07/23/2021   FREET4 1.16 01/03/2021   FREET4 0.90 02/29/2020   FREET4 1.07 10/31/2019   FREET4 0.73 03/01/2019   FREET4 0.89 08/31/2018   FREET4 0.81 05/31/2018   FREET4 1.18 05/03/2018   FREET4 0.79 12/23/2017   T3FREE 2.6 03/01/2019   T3FREE 2.6 08/31/2018   T3FREE 2.5 05/31/2018   T3FREE 3.2 12/23/2017   ROS: + see HPI  I reviewed pt's medications, allergies, PMH, social hx, family hx, and changes were documented in the history of present illness. Otherwise, unchanged from my initial visit note.  Past Medical History:  Diagnosis Date   Aortic atherosclerosis ([place]) 06/28/2019   HTN (hypertension)    Hyperlipidemia    Hypertension 09/25/2016   Osteoporosis    Past Surgical History:  Procedure Laterality Date   ABDOMINAL HYSTERECTOMY     PARATHYROIDECTOMY N/A 09/03/2017   Procedure: NECK EXPLORATION WITH LEFT INFERIOR PARATHYROIDECTOMY AND RIGHT SUPERIOR PARATHYROIDECTOMY;  Surgeon: Armandina Gemma, MD;  Location: [facility];  Service: General;  Laterality: N/A;   THYROID LOBECTOMY Right 09/03/2017   Procedure: RIGHT THYROID LOBECTOMY;  Surgeon: Armandina Gemma, MD;  Location: [facility];  Service: General;  Laterality: Right;    Social History   Socioeconomic History   Marital status: Married    Spouse name: Not on file   Number of children: Not on file   Years of education: Not on file   Highest education level: Not on file  Occupational History   Not on file  Tobacco Use   Smoking status: Never   Smokeless tobacco: Never  Vaping Use   Vaping Use: Never used  Substance and Sexual Activity   Alcohol use: No   Drug use: No   Sexual activity: Never  Other Topics Concern   Not on file  Social History Narrative   Not on file   Social Determinants of Health   Financial Resource Strain: Not on file  Food Insecurity: Not on file  Transportation Needs: Not on file  Physical Activity: Not on file  Stress: Not on file  Social Connections: Not on file  Intimate Partner Violence: Not on file   Current Outpatient Medications on File Prior to Visit  Medication Sig Dispense Refill   alendronate (FOSAMAX) 70 MG tablet Take 1 tablet (70 mg total) by mouth once a week. Take with a full glass of water on an empty stomach. 12 tablet 3   atorvastatin (LIPITOR) 40 MG tablet Take 1 tablet (40 mg total) by mouth daily. 90 tablet 1   fluticasone (FLONASE) 50 MCG/ACT nasal spray Place 2 sprays into both nostrils daily. 16 g 1   furosemide (LASIX) 20 MG tablet Take 0.5 tablets (10 mg total) by mouth daily. 90 tablet 0   levothyroxine (SYNTHROID) 50 MCG tablet Take 1 tablet by mouth once daily 90 tablet 1   lisinopril (ZESTRIL) 40 MG tablet Take 1 tablet (40 mg total) by mouth daily. 90 tablet 2   polyethylene glycol powder (GLYCOLAX/MIRALAX) powder Take 17 g by mouth 2 (two) times daily as needed. (Patient taking differently: Take 17 g by mouth 2 (two) times daily as needed for mild constipation.) 3350 g 1   Vitamin D, Cholecalciferol, 1000 units CAPS Take 2,000 Units by mouth daily.      No  current facility-administered medications on file prior to visit.   No Known Allergies Family History  Problem Relation Age  of Onset   Diabetes Sister    Hypertension Sister    Hypertension Brother    Heart disease Brother    PE: There were no vitals taken for this visit. Wt Readings from Last 3 Encounters:  06/30/22 189 lb 3.2 oz (85.8 kg)  05/12/22 185 lb 12.8 oz (84.3 kg)  11/07/21 181 lb (82.1 kg)   Constitutional: overweight, in NAD Eyes:  EOMI, no exophthalmos ENT: no neck masses, no cervical lymphadenopathy Cardiovascular: RRR, No MRG Respiratory: CTA B Musculoskeletal: no deformities Skin:no rashes Neurological: no tremor with outstretched hands  Assessment: 1. Hypercalcemia/Primary hyperparathyroidism  2.  Osteopenia  3. Vit D def  4.  Thyroid nodule  5.  Postsurgical hypothyroidism  Plan: 1. Patient with history of primary hyperparathyroidism with a previous calcium level being as high as 11.1 and a PTH as high as 142.  The hyperparathyroidism persisted even after normalization of her vitamin D.  Of note, she does have CKD which can raise the PTH, but not severe enough to cause this degree of hyperparathyroidism.  Investigation pointed towards a left inferior and right superior parathyroid adenomas.  Thyroid ultrasound showed a left inferior nodule, most consistent with a parathyroid adenoma and she had a thyroid nodule that was found incidentally at the time of her surgery.  Thyroid ultrasound showed a left inferior nodule, most consistent with a parathyroid adenoma and she had a thyroid nodule that was found incidentally at the time of her surgery -this was benign. -She had 2 gland parathyroidectomy 2019 and pathology showed that only the left inferior gland was adenomatous -Her PTH and calcium levels normalized after surgery.  Most recent calcium level was normal on 06/30/2022.  The PTH was normal 05/12/2022.  2.  Osteopenia -No falls or fractures since last visit -Reviewed her DXA scan reports from 10/2017 and 04/2019: At the level of the spine, her T score is approximately the same  (increased from -2.4 to -2.3), however, at the level of the femoral necks, bone density increased by 10.8%* -Since her T-scores were in the osteopenic range, we discussed about following her without medications and repeat the bone density scan, after which we can see if she needed antiresorptive medication. -We also reviewed together the results of her latest DXA scan from 10/16/2021.  She has borderline osteoporosis.  At that time, I suggested Fosamax. -She is currently on Fosamax 70 mg weekly, without side effects.  She takes this correctly. -She was approved for Prolia in the past, in case we need to start this -We will repeat her bone density scan in 2 years from the previous  3. Vit D def -She continues on 2000 units vitamin D daily -Vitamin D level was normal at last check, a year ago -We will recheck her vitamin D level today  4. Thyroid nodule -Per the latest thyroid ultrasound report from 04/2017, she had a right-sided thyroid nodule, with the largest dimension being 1.6 cm.  Biopsy of this nodule was inconclusive due to scant epithelial cells and we discussed about repeating the biopsy but she refused to have another biopsy due to discomfort at the time of the first biopsy. -She had a right lobectomy at the time of her parathyroid surgery and the final pathology was benign -No neck compression symptoms  5.  Postsurgical hypothyroidism -Previously uncontrolled -She previously had hot flashes from levothyroxine so we switched  to Tirosint due to itching with the levothyroxine tablet.  However, this was not covered so we switched to Synthroid d.a.w.  Afterwards, since this was also expensive, we retried generic levothyroxine -we have her on 50 mcg daily dose, which is without coloring agents. - latest thyroid labs reviewed with pt. >> normal 43-monthago: Lab Results  Component Value Date   TSH 1.490 05/12/2022  - she continues on LT4 50 mcg daily - pt feels good on this dose. - we  discussed about taking the thyroid hormone every day, with water, >30 minutes before breakfast, separated by >4 hours from acid reflux medications, calcium, iron, multivitamins. Pt. is taking it correctly.  CPhilemon Kingdom MD PhD [facility]

## 2022-08-12 ENCOUNTER — Other Ambulatory Visit: Payer: Self-pay | Admitting: Internal Medicine

## 2022-09-02 ENCOUNTER — Other Ambulatory Visit: Payer: Self-pay | Admitting: Internal Medicine

## 2022-10-06 ENCOUNTER — Ambulatory Visit (INDEPENDENT_AMBULATORY_CARE_PROVIDER_SITE_OTHER): Payer: Medicare HMO | Admitting: Family Medicine

## 2022-10-06 ENCOUNTER — Encounter: Payer: Self-pay | Admitting: Family Medicine

## 2022-10-06 VITALS — BP 130/86 | HR 80 | Temp 98.7°F | Ht 69.0 in | Wt 188.4 lb

## 2022-10-06 DIAGNOSIS — M25551 Pain in right hip: Secondary | ICD-10-CM | POA: Diagnosis not present

## 2022-10-06 DIAGNOSIS — R109 Unspecified abdominal pain: Secondary | ICD-10-CM

## 2022-10-06 LAB — POCT URINALYSIS DIP (PROADVANTAGE DEVICE)
Bilirubin, UA: NEGATIVE
Blood, UA: NEGATIVE
Glucose, UA: NEGATIVE mg/dL
Ketones, POC UA: NEGATIVE mg/dL
Nitrite, UA: NEGATIVE
Protein Ur, POC: NEGATIVE mg/dL
Specific Gravity, Urine: 1.01
Urobilinogen, Ur: 0.2
pH, UA: 6 (ref 5.0–8.0)

## 2022-10-06 NOTE — Progress Notes (Signed)
Chief Complaint  Patient presents with   Flank Pain    Right sided flank pain since Friday. Worse when she stands or bends over. Can't stand up straight. Took some Tylenol, not really helping.    Pain started on 2/9 at the right lateral hip (low back, lateral hip to upper lateral thigh). She noticed this late in the day. Denies any change in activity.   She wonders if she could have twisted wrong--she had lifted a heavy bag of water bottles (24#), but didn't feel any discomfort at that time. Thinks she may have lifted it awkwardly.  At first she thought it was getting better, but today she can hardly stand up straight. She had some back pain at first, but that resolved (tylenol helped with that) Tylenol hasn't helped much with the pain on the side. Hasn't tried any other measures (no heat/ice, stretches or any topical medications).  Denies shooting pain into the legs, no numbness, tingling, weakness. No bowel or bladder issues. Pain 5/10 when seated.  Pain shoots a little when she stands up (short-lived).  Since yesterday, she has noted some pain with walking, hard to stand up straight.    PMH, PSH, SH reviewed   Outpatient Encounter Medications as of 10/06/2022  Medication Sig Note   acetaminophen (TYLENOL) 500 MG tablet Take 1,000 mg by mouth every 6 (six) hours as needed. 10/06/2022: Last dose last night before bed   alendronate (FOSAMAX) 70 MG tablet TAKE 1 TABLET BY MOUTH ONCE A WEEK ON AN EMPTY STOMACH WITH  FULL  GLASS  OF  WATER    atorvastatin (LIPITOR) 40 MG tablet Take 1 tablet (40 mg total) by mouth daily.    furosemide (LASIX) 20 MG tablet Take 0.5 tablets (10 mg total) by mouth daily.    levothyroxine (SYNTHROID) 50 MCG tablet Take 1 tablet by mouth once daily    lisinopril (ZESTRIL) 40 MG tablet Take 1 tablet (40 mg total) by mouth daily.    Vitamin D, Cholecalciferol, 1000 units CAPS Take 2,000 Units by mouth daily.     fluticasone (FLONASE) 50 MCG/ACT nasal spray Place 2  sprays into both nostrils daily. (Patient not taking: Reported on 10/06/2022) 10/06/2022: As needed    polyethylene glycol powder (GLYCOLAX/MIRALAX) powder Take 17 g by mouth 2 (two) times daily as needed. (Patient not taking: Reported on 10/06/2022) 10/06/2022: As needed, rarely   No facility-administered encounter medications on file as of 10/06/2022.   No Known Allergies  ROS: f/c, nv/d, abdominal pain, urinary complaints, bowel complaints. No numbness, tingling or weakness of the legs. Denies any rash, bleeding or bruising. No vaginal discharge. See HPI   PHYSICAL EXAM:  BP 130/86   Pulse 80   Temp 98.7 F (37.1 C) (Tympanic)   Ht 5' 9"$  (1.753 m)   Wt 188 lb 6.4 oz (85.5 kg)   BMI 27.82 kg/m   Wt Readings from Last 3 Encounters:  10/06/22 188 lb 6.4 oz (85.5 kg)  07/23/22 188 lb 6.4 oz (85.5 kg)  06/30/22 189 lb 3.2 oz (85.8 kg)   Well-appearing pleasant female. Not standing straight, moving somewhat slowly at first.   After exam and some hip/back stretches on exam table, she felt better, could stand up straight. HEENT: conjunctiva and sclera are clear, EOMI. Wearing mask Neck: No lymphadenopathy or mass Heart: regular rate and rhythm Lungs: clear bilaterally Spine: no spinal or CVA tenderness, no muscle spasm, no SI tenderness.  Nontender at sciatic notch, no pain with  pyriformis stretch (had R side discomfort when stretching the OPPOSITE leg). Had some pain at right lateral hip with forward flexion of back. Extremities: no edema.  Nontender at trochanteric bursa, ASIS, IT band. Neuro: DTR's diminished at knees bilaterally, symmetric.  Normal gait.  Normal strength in LE's. Extremities: no edema  Urine: SG 1.010, trace leuks, otherwise normal  ASSESSMENT/PLAN:  Right hip pain - lateral, not reproducible on exam, no ITB or bursitis. no muscle spasm. Unclear etiology. Check XR (on bisphosphonate). Shown stretches, trial heat - Plan: DG HIP UNILAT W OR W/O PELVIS 2-3  VIEWS RIGHT  Flank pain - Plan: POCT Urinalysis DIP (Proadvantage Device)  I spent 30 minutes dedicated to the care of this patient, including pre-visit review of records, face to face time, post-visit ordering of testing and documentation.  Your exam was fairly normal today--no evidence of a back issue or bursitis. You did seem to have some improvement after the seated stretches, so try and do those stretches throughout the day. Given that you take alendronate and there is a potential risk for atypical hip fractures, let's go ahead and do an x-ray to make sure there is no problem with the hip and that you can continue the medication.  You may continue to use tylenol as needed. If this isn't helpful, then try taking Aleve twice daily with food, for up to a week. You may need to follow-up for re-evaluation if pain is getting worse.  You can try heat and/or ice--continue whichever feels best (sometimes alternating the two feels the best). You can also try a topical patch such as SalonPas with lidocaine.

## 2022-10-06 NOTE — Patient Instructions (Addendum)
Your exam was fairly normal today--no evidence of a back issue or bursitis. You did seem to have some improvement after the seated stretches, so try and do those stretches throughout the day. Given that you take alendronate and there is a potential risk for atypical hip fractures, let's go ahead and do an x-ray to make sure there is no problem with the hip and that you can continue the medication.  You may continue to use tylenol as needed. If this isn't helpful, then try taking Aleve twice daily with food, for up to a week. You may need to follow-up for re-evaluation if pain is getting worse.  You can try heat and/or ice--continue whichever feels best (sometimes alternating the two feels the best). You can also try a topical patch such as SalonPas with lidocaine.  Go to [facility] at [employer] to get the x-rays of your hip. It is up to you if you want to go today, vs wait and see if it starts to feel better with the stretches and medications we discussed.  Try and avoid a lot of lifting/bending/twisting. Remember to lift/bend from your knees, not the waist.

## 2022-10-22 ENCOUNTER — Telehealth: Payer: Self-pay | Admitting: Medical

## 2022-10-22 NOTE — Telephone Encounter (Signed)
error 

## 2022-10-22 NOTE — Telephone Encounter (Signed)
Contacted Tracy Rowe to schedule their annual wellness visit. Appointment made for 11/07/21.  Tracy Rowe AWV direct phone # [phone]

## 2022-11-11 ENCOUNTER — Ambulatory Visit (INDEPENDENT_AMBULATORY_CARE_PROVIDER_SITE_OTHER): Payer: Medicare HMO

## 2022-11-11 VITALS — Ht 68.0 in | Wt 180.0 lb

## 2022-11-11 DIAGNOSIS — Z0000 Encounter for general adult medical examination without abnormal findings: Secondary | ICD-10-CM

## 2022-11-11 NOTE — Progress Notes (Signed)
I connected with  Tracy Rowe on 11/11/22 by a audio enabled telemedicine application and verified that I am speaking with the correct person using two identifiers.  Patient Location: Home  Provider Location: Office/Clinic  I discussed the limitations of evaluation and management by telemedicine. The patient expressed understanding and agreed to proceed.  Subjective:   Tracy Rowe is a 77 y.o. female who presents for Medicare Annual (Subsequent) preventive examination.  Review of Systems     Cardiac Risk Factors include: advanced age (>108men, >81 women);dyslipidemia;hypertension     Objective:    Today's Vitals   11/11/22 1011  Weight: 180 lb (81.6 kg)  Height: 5\' 8"  (1.727 m)   Body mass index is 27.37 kg/m.     11/11/2022   10:14 AM 11/07/2021   10:45 AM 10/31/2020    9:55 AM 09/03/2017    3:30 PM 09/03/2017    9:25 AM 08/24/2017   10:19 AM  Advanced Directives  Does Patient Have a Medical Advance Directive? Yes Yes Yes Yes Yes Yes  Type of Advance Directive Out of facility DNR (pink MOST or yellow form)  Out of facility DNR (pink MOST or yellow form) [place];Living will [place];Living will [place];Living will  Does patient want to make changes to medical advance directive?  No - Patient declined No - Patient declined No - Patient declined No - Patient declined No - Patient declined  Copy of [place] in Chart?    No - copy requested No - copy requested No - copy requested    Current Medications (verified) Outpatient Encounter Medications as of 11/11/2022  Medication Sig   acetaminophen (TYLENOL) 500 MG tablet Take 1,000 mg by mouth every 6 (six) hours as needed.   alendronate (FOSAMAX) 70 MG tablet TAKE 1 TABLET BY MOUTH ONCE A WEEK ON AN EMPTY STOMACH WITH  FULL  GLASS  OF  WATER   atorvastatin (LIPITOR) 40 MG tablet Take 1 tablet (40 mg total) by mouth daily.   fluticasone  (FLONASE) 50 MCG/ACT nasal spray Place 2 sprays into both nostrils daily.   furosemide (LASIX) 20 MG tablet Take 0.5 tablets (10 mg total) by mouth daily.   levothyroxine (SYNTHROID) 50 MCG tablet Take 1 tablet by mouth once daily   lisinopril (ZESTRIL) 40 MG tablet Take 1 tablet (40 mg total) by mouth daily.   polyethylene glycol powder (GLYCOLAX/MIRALAX) powder Take 17 g by mouth 2 (two) times daily as needed.   Vitamin D, Cholecalciferol, 1000 units CAPS Take 2,000 Units by mouth daily.    No facility-administered encounter medications on file as of 11/11/2022.    Allergies (verified) Patient has no known allergies.   History: Past Medical History:  Diagnosis Date   Aortic atherosclerosis ([place]) 06/28/2019   HTN (hypertension)    Hyperlipidemia    Hypertension 09/25/2016   Osteoporosis    Past Surgical History:  Procedure Laterality Date   ABDOMINAL HYSTERECTOMY     PARATHYROIDECTOMY N/A 09/03/2017   Procedure: NECK EXPLORATION WITH LEFT INFERIOR PARATHYROIDECTOMY AND RIGHT SUPERIOR PARATHYROIDECTOMY;  Surgeon: Armandina Gemma, MD;  Location: [facility];  Service: General;  Laterality: N/A;   THYROID LOBECTOMY Right 09/03/2017   Procedure: RIGHT THYROID LOBECTOMY;  Surgeon: Armandina Gemma, MD;  Location: [facility];  Service: General;  Laterality: Right;   Family History  Problem Relation Age of Onset   Diabetes Sister    Hypertension Sister    Hypertension Brother  Heart disease Brother    Social History   Socioeconomic History   Marital status: Married    Spouse name: Not on file   Number of children: Not on file   Years of education: Not on file   Highest education level: Not on file  Occupational History   Not on file  Tobacco Use   Smoking status: Never   Smokeless tobacco: Never  Vaping Use   Vaping Use: Never used  Substance and Sexual Activity   Alcohol use: No   Drug use: No   Sexual activity: Never  Other Topics Concern   Not on file  Social History Narrative    Not on file   Social Determinants of Health   Financial Resource Strain: Low Risk  (11/11/2022)   Overall Financial Resource Strain (CARDIA)    Difficulty of Paying Living Expenses: Not hard at all  Food Insecurity: No Food Insecurity (11/11/2022)   Hunger Vital Sign    Worried About Running Out of Food in the Last Year: Never true    Ran Out of Food in the Last Year: Never true  Transportation Needs: No Transportation Needs (11/11/2022)   PRAPARE - Hydrologist (Medical): No    Lack of Transportation (Non-Medical): No  Physical Activity: Inactive (11/11/2022)   Exercise Vital Sign    Days of Exercise per Week: 0 days    Minutes of Exercise per Session: 0 min  Stress: No Stress Concern Present (11/11/2022)   [place]    Feeling of Stress : Not at all  Social Connections: Not on file    Tobacco Counseling Counseling given: Not Answered   Clinical Intake:  Pre-visit preparation completed: Yes  Pain : No/denies pain     Nutritional Status: BMI 25 -29 Overweight Nutritional Risks: None Diabetes: No  How often do you need to have someone help you when you read instructions, pamphlets, or other written materials from your doctor or pharmacy?: 1 - Never  Diabetic? no  Interpreter Needed?: No  Information entered by :: NAllen LPN   Activities of Daily Living    11/11/2022   10:15 AM  In your present state of health, do you have any difficulty performing the following activities:  Hearing? 0  Vision? 0  Difficulty concentrating or making decisions? 0  Walking or climbing stairs? 0  Dressing or bathing? 0  Doing errands, shopping? 0  Preparing Food and eating ? N  Using the Toilet? N  In the past six months, have you accidently leaked urine? N  Do you have problems with loss of bowel control? N  Managing your Medications? N  Managing your Finances? N  Housekeeping or  managing your Housekeeping? N    Patient Care Team: Tysinger, Camelia Eng, PA-C as PCP - General (Family Medicine) [name], MD as Consulting Physician (Internal Medicine)  Indicate any recent Medical Services you may have received from other than [facility] providers in the past year (date may be approximate).     Assessment:   This is a routine wellness examination for [facility].  Hearing/Vision screen Vision Screening - Comments:: No regular eye exams,   Dietary issues and exercise activities discussed: Current Exercise Habits: The patient does not participate in regular exercise at present   Goals Addressed             This Visit's Progress    Patient Stated  11/11/2022, wants to stay healthy       Depression Screen    11/11/2022   10:15 AM 05/12/2022    9:45 AM 11/07/2021   10:31 AM 01/02/2021    9:31 AM 10/31/2020    9:56 AM 05/03/2020    9:58 AM 10/31/2019    9:18 AM  PHQ 2/9 Scores  PHQ - 2 Score 0 0 0 0 0 0 0  PHQ- 9 Score 0          Fall Risk    11/11/2022   10:15 AM 10/06/2022    1:50 PM 05/12/2022    9:45 AM 11/07/2021   10:31 AM 01/02/2021    9:31 AM  [place] in the past year? 0 0 0 0 0  Number falls in past yr: 0 0 0 0 0  Injury with Fall? 0 0 0 0 0  Risk for fall due to : Medication side effect No Fall Risks No Fall Risks No Fall Risks No Fall Risks  Follow up Falls prevention discussed;Education provided;Falls evaluation completed Falls evaluation completed Falls evaluation completed Falls evaluation completed Falls evaluation completed    FALL RISK PREVENTION PERTAINING TO THE HOME:  Any stairs in or around the home? Yes  If so, are there any without handrails? No  Home free of loose throw rugs in walkways, pet beds, electrical cords, etc? Yes  Adequate lighting in your home to reduce risk of falls? Yes   ASSISTIVE DEVICES UTILIZED TO PREVENT FALLS:  Life alert? No  Use of a cane, [place] or w/c? No  Grab bars in the bathroom? Yes   Shower chair or bench in shower? Yes  Elevated toilet seat or a handicapped toilet? Yes   TIMED UP AND GO:  Was the test performed? No .      Cognitive Function:        11/11/2022   10:16 AM  6CIT Screen  What Year? 0 points  What month? 0 points  What time? 0 points  Count back from 20 0 points  Months in reverse 2 points  Repeat phrase 0 points  Total Score 2 points    Immunizations Immunization History  Administered Date(s) Administered   COVID-19, mRNA, vaccine(Comirnaty)12 years and older 07/15/2022   Fluad Quad(high Dose 65+) 05/03/2020, 06/26/2021, 05/12/2022   Influenza, High Dose Seasonal PF 10/23/2016, 05/22/2017, 06/14/2018, 05/18/2019   Influenza-Unspecified 05/18/2019   PFIZER(Purple Top)SARS-COV-2 Vaccination 10/22/2019, 11/19/2019, 07/02/2020   PNEUMOCOCCAL CONJUGATE-20 11/23/2021   Pfizer Covid-19 Vaccine Bivalent Booster 73yrs & up 07/31/2021   Pneumococcal Conjugate-13 10/23/2016   Pneumococcal Polysaccharide-23 10/29/2018   Tdap 10/30/2016   Zoster Recombinat (Shingrix) 02/18/2022, 04/29/2022    TDAP status: Up to date  Flu Vaccine status: Up to date  Pneumococcal vaccine status: Up to date  Covid-19 vaccine status: Completed vaccines  Qualifies for Shingles Vaccine? Yes   Zostavax completed Yes   Shingrix Completed?: Yes  Screening Tests Health Maintenance  Topic Date Due   COVID-19 Vaccine (6 - 2023-24 season) 09/09/2022   Medicare Annual Wellness (AWV)  11/11/2023   DTaP/Tdap/Td (2 - Td or Tdap) 10/31/2026   Pneumonia Vaccine 69+ Years old  Completed   INFLUENZA VACCINE  Completed   DEXA SCAN  Completed   Hepatitis C Screening  Completed   Zoster Vaccines- Shingrix  Completed   HPV VACCINES  Aged Out   Fecal DNA (Cologuard)  Discontinued    Health Maintenance  Health Maintenance Due  Topic Date Due  COVID-19 Vaccine (6 - 2023-24 season) 09/09/2022    Colorectal cancer screening: No longer required.   Mammogram  status: Completed 01/28/2022. Repeat every year  Bone Density status: Completed 10/16/2021.   Lung Cancer Screening: (Low Dose CT Chest recommended if Age 38-80 years, 30 pack-year currently smoking OR have quit w/in 15years.) does not qualify.   Lung Cancer Screening Referral: no  Additional Screening:  Hepatitis C Screening: does qualify; Completed 10/31/2019  Vision Screening: Recommended annual ophthalmology exams for early detection of glaucoma and other disorders of the eye. Is the patient up to date with their annual eye exam?  No  Who is the provider or what is the name of the office in which the patient attends annual eye exams? none If pt is not established with a provider, would they like to be referred to a provider to establish care? No .   Dental Screening: Recommended annual dental exams for proper oral hygiene  Community Resource Referral / Chronic Care Management: CRR required this visit?  No   CCM required this visit?  No      Plan:     I have personally reviewed and noted the following in the patient's chart:   Medical and social history Use of alcohol, tobacco or illicit drugs  Current medications and supplements including opioid prescriptions. Patient is not currently taking opioid prescriptions. Functional ability and status Nutritional status Physical activity Advanced directives List of other physicians Hospitalizations, surgeries, and ER visits in previous 12 months Vitals Screenings to include cognitive, depression, and falls Referrals and appointments  In addition, I have reviewed and discussed with patient certain preventive protocols, quality metrics, and best practice recommendations. A written personalized care plan for preventive services as well as general preventive health recommendations were provided to patient.     Kellie Simmering, LPN   [id number]   Nurse Notes: none  Due to this being a virtual visit, the after visit summary with  patients personalized plan was offered to patient via mail or my-chart. to pick up at office at next visit

## 2022-11-11 NOTE — Patient Instructions (Signed)
[name] , Thank you for taking time to come for your Medicare Wellness Visit. I appreciate your ongoing commitment to your health goals. Please review the following plan we discussed and let me know if I can assist you in the future.   These are the goals we discussed:  Goals      Patient Stated     11/11/2022, wants to stay healthy        This is a list of the screening recommended for you and due dates:  Health Maintenance  Topic Date Due   COVID-19 Vaccine (6 - 2023-24 season) 09/09/2022   Medicare Annual Wellness Visit  11/11/2023   DTaP/Tdap/Td vaccine (2 - Td or Tdap) 10/31/2026   Pneumonia Vaccine  Completed   Flu Shot  Completed   DEXA scan (bone density measurement)  Completed   Hepatitis C Screening: USPSTF Recommendation to screen - Ages 46-79 yo.  Completed   Zoster (Shingles) Vaccine  Completed   HPV Vaccine  Aged Out   Cologuard (Stool DNA test)  Discontinued    Advanced directives: copy in chart  Conditions/risks identified: none  Next appointment: Follow up in one year for your annual wellness visit    Preventive Care 65 Years and Older, Female Preventive care refers to lifestyle choices and visits with your health care provider that can promote health and wellness. What does preventive care include? A yearly physical exam. This is also called an annual well check. Dental exams once or twice a year. Routine eye exams. Ask your health care provider how often you should have your eyes checked. Personal lifestyle choices, including: Daily care of your teeth and gums. Regular physical activity. Eating a healthy diet. Avoiding tobacco and drug use. Limiting alcohol use. Practicing safe sex. Taking low-dose aspirin every day. Taking vitamin and mineral supplements as recommended by your health care provider. What happens during an annual well check? The services and screenings done by your health care provider during your annual well check will depend on  your age, overall health, lifestyle risk factors, and family history of disease. Counseling  Your health care provider may ask you questions about your: Alcohol use. Tobacco use. Drug use. Emotional well-being. Home and relationship well-being. Sexual activity. Eating habits. History of falls. Memory and ability to understand (cognition). Work and work Statistician. Reproductive health. Screening  You may have the following tests or measurements: Height, weight, and BMI. Blood pressure. Lipid and cholesterol levels. These may be checked every 5 years, or more frequently if you are over 65 years old. Skin check. Lung cancer screening. You may have this screening every year starting at age 22 if you have a 30-pack-year history of smoking and currently smoke or have quit within the past 15 years. Fecal occult blood test (FOBT) of the stool. You may have this test every year starting at age 51. Flexible sigmoidoscopy or colonoscopy. You may have a sigmoidoscopy every 5 years or a colonoscopy every 10 years starting at age 53. Hepatitis C blood test. Hepatitis B blood test. Sexually transmitted disease (STD) testing. Diabetes screening. This is done by checking your blood sugar (glucose) after you have not eaten for a while (fasting). You may have this done every 1-3 years. Bone density scan. This is done to screen for osteoporosis. You may have this done starting at age 77. Mammogram. This may be done every 1-2 years. Talk to your health care provider about how often you should have regular mammograms. Talk with your  health care provider about your test results, treatment options, and if necessary, the need for more tests. Vaccines  Your health care provider may recommend certain vaccines, such as: Influenza vaccine. This is recommended every year. Tetanus, diphtheria, and acellular pertussis (Tdap, Td) vaccine. You may need a Td booster every 10 years. Zoster vaccine. You may need this  after age 43. Pneumococcal 13-valent conjugate (PCV13) vaccine. One dose is recommended after age 32. Pneumococcal polysaccharide (PPSV23) vaccine. One dose is recommended after age 13. Talk to your health care provider about which screenings and vaccines you need and how often you need them. This information is not intended to replace advice given to you by your health care provider. Make sure you discuss any questions you have with your health care provider. Document Released: 09/07/2015 Document Revised: 04/30/2016 Document Reviewed: 06/12/2015 Elsevier Interactive Patient Education  2017 [place] Prevention in the Home Falls can cause injuries. They can happen to people of all ages. There are many things you can do to make your home safe and to help prevent falls. What can I do on the outside of my home? Regularly fix the edges of walkways and driveways and fix any cracks. Remove anything that might make you trip as you walk through a door, such as a raised step or threshold. Trim any bushes or trees on the path to your home. Use bright outdoor lighting. Clear any walking paths of anything that might make someone trip, such as rocks or tools. Regularly check to see if handrails are loose or broken. Make sure that both sides of any steps have handrails. Any raised decks and porches should have guardrails on the edges. Have any leaves, snow, or ice cleared regularly. Use sand or salt on walking paths during winter. Clean up any spills in your garage right away. This includes oil or grease spills. What can I do in the bathroom? Use night lights. Install grab bars by the toilet and in the tub and shower. Do not use towel bars as grab bars. Use non-skid mats or decals in the tub or shower. If you need to sit down in the shower, use a plastic, non-slip stool. Keep the floor dry. Clean up any water that spills on the floor as soon as it happens. Remove soap buildup in the tub or  shower regularly. Attach bath mats securely with double-sided non-slip rug tape. Do not have throw rugs and other things on the floor that can make you trip. What can I do in the bedroom? Use night lights. Make sure that you have a light by your bed that is easy to reach. Do not use any sheets or blankets that are too big for your bed. They should not hang down onto the floor. Have a firm chair that has side arms. You can use this for support while you get dressed. Do not have throw rugs and other things on the floor that can make you trip. What can I do in the kitchen? Clean up any spills right away. Avoid walking on wet floors. Keep items that you use a lot in easy-to-reach places. If you need to reach something above you, use a strong step stool that has a grab bar. Keep electrical cords out of the way. Do not use floor polish or wax that makes floors slippery. If you must use wax, use non-skid floor wax. Do not have throw rugs and other things on the floor that can make you trip. What  can I do with my stairs? Do not leave any items on the stairs. Make sure that there are handrails on both sides of the stairs and use them. Fix handrails that are broken or loose. Make sure that handrails are as long as the stairways. Check any carpeting to make sure that it is firmly attached to the stairs. Fix any carpet that is loose or worn. Avoid having throw rugs at the top or bottom of the stairs. If you do have throw rugs, attach them to the floor with carpet tape. Make sure that you have a light switch at the top of the stairs and the bottom of the stairs. If you do not have them, ask someone to add them for you. What else can I do to help prevent falls? Wear shoes that: Do not have high heels. Have rubber bottoms. Are comfortable and fit you well. Are closed at the toe. Do not wear sandals. If you use a stepladder: Make sure that it is fully opened. Do not climb a closed stepladder. Make  sure that both sides of the stepladder are locked into place. Ask someone to hold it for you, if possible. Clearly mark and make sure that you can see: Any grab bars or handrails. First and last steps. Where the edge of each step is. Use tools that help you move around (mobility aids) if they are needed. These include: Canes. Walkers. Scooters. Crutches. Turn on the lights when you go into a dark area. Replace any light bulbs as soon as they burn out. Set up your furniture so you have a clear path. Avoid moving your furniture around. If any of your floors are uneven, fix them. If there are any pets around you, be aware of where they are. Review your medicines with your doctor. Some medicines can make you feel dizzy. This can increase your chance of falling. Ask your doctor what other things that you can do to help prevent falls. This information is not intended to replace advice given to you by your health care provider. Make sure you discuss any questions you have with your health care provider. Document Released: 06/07/2009 Document Revised: 01/17/2016 Document Reviewed: 09/15/2014 Elsevier Interactive Patient Education  2017 [employer].

## 2022-11-12 ENCOUNTER — Ambulatory Visit (INDEPENDENT_AMBULATORY_CARE_PROVIDER_SITE_OTHER): Payer: Medicare HMO | Admitting: Medical

## 2022-11-12 VITALS — BP 124/80 | HR 64 | Ht 68.5 in | Wt 189.2 lb

## 2022-11-12 DIAGNOSIS — Z0000 Encounter for general adult medical examination without abnormal findings: Secondary | ICD-10-CM | POA: Diagnosis not present

## 2022-11-12 DIAGNOSIS — E782 Mixed hyperlipidemia: Secondary | ICD-10-CM | POA: Diagnosis not present

## 2022-11-12 DIAGNOSIS — I10 Essential (primary) hypertension: Secondary | ICD-10-CM

## 2022-11-12 DIAGNOSIS — I700 Atherosclerosis of aorta: Secondary | ICD-10-CM

## 2022-11-12 DIAGNOSIS — E210 Primary hyperparathyroidism: Secondary | ICD-10-CM

## 2022-11-12 DIAGNOSIS — M810 Age-related osteoporosis without current pathological fracture: Secondary | ICD-10-CM

## 2022-11-12 DIAGNOSIS — Z136 Encounter for screening for cardiovascular disorders: Secondary | ICD-10-CM | POA: Diagnosis not present

## 2022-11-12 DIAGNOSIS — E559 Vitamin D deficiency, unspecified: Secondary | ICD-10-CM | POA: Diagnosis not present

## 2022-11-12 DIAGNOSIS — N1831 Chronic kidney disease, stage 3a: Secondary | ICD-10-CM

## 2022-11-12 DIAGNOSIS — R0989 Other specified symptoms and signs involving the circulatory and respiratory systems: Secondary | ICD-10-CM

## 2022-11-12 LAB — LIPID PANEL

## 2022-11-12 NOTE — Progress Notes (Signed)
Subjective:   HPI  Tracy Rowe is a 77 y.o. female who presents for Chief Complaint  Patient presents with   fasting cpe    Fasting cpe , awv done yesterday-  no concerns    Patient Care Team: Krystle Polcyn, Camelia Eng, PA-C as PCP - General (Family Medicine) [name], MD as Consulting Physician (Internal Medicine) Dr. Landis Martins, podiatry    Concerns: Here for well visit  Exercising some.   Does a lot of activity around the house with housework  Compliant with medications  Reviewed their medical, surgical, family, social, medication, and allergy history and updated chart as appropriate.  No Known Allergies  Past Medical History:  Diagnosis Date   Aortic atherosclerosis ([place]) 06/28/2019   HTN (hypertension)    Hyperlipidemia    Hypertension 09/25/2016   Osteoporosis     Current Outpatient Medications on File Prior to Visit  Medication Sig Dispense Refill   alendronate (FOSAMAX) 70 MG tablet TAKE 1 TABLET BY MOUTH ONCE A WEEK ON AN EMPTY STOMACH WITH  FULL  GLASS  OF  WATER 12 tablet 0   atorvastatin (LIPITOR) 40 MG tablet Take 1 tablet (40 mg total) by mouth daily. 90 tablet 1   fluticasone (FLONASE) 50 MCG/ACT nasal spray Place 2 sprays into both nostrils daily. 16 g 1   furosemide (LASIX) 20 MG tablet Take 0.5 tablets (10 mg total) by mouth daily. 90 tablet 0   levothyroxine (SYNTHROID) 50 MCG tablet Take 1 tablet by mouth once daily 90 tablet 3   lisinopril (ZESTRIL) 40 MG tablet Take 1 tablet (40 mg total) by mouth daily. 90 tablet 2   polyethylene glycol powder (GLYCOLAX/MIRALAX) powder Take 17 g by mouth 2 (two) times daily as needed. 3350 g 1   Vitamin D, Cholecalciferol, 1000 units CAPS Take 2,000 Units by mouth daily.      acetaminophen (TYLENOL) 500 MG tablet Take 1,000 mg by mouth every 6 (six) hours as needed.     No current facility-administered medications on file prior to visit.      Current Outpatient Medications:    alendronate (FOSAMAX) 70  MG tablet, TAKE 1 TABLET BY MOUTH ONCE A WEEK ON AN EMPTY STOMACH WITH  FULL  GLASS  OF  WATER, Disp: 12 tablet, Rfl: 0   atorvastatin (LIPITOR) 40 MG tablet, Take 1 tablet (40 mg total) by mouth daily., Disp: 90 tablet, Rfl: 1   fluticasone (FLONASE) 50 MCG/ACT nasal spray, Place 2 sprays into both nostrils daily., Disp: 16 g, Rfl: 1   furosemide (LASIX) 20 MG tablet, Take 0.5 tablets (10 mg total) by mouth daily., Disp: 90 tablet, Rfl: 0   levothyroxine (SYNTHROID) 50 MCG tablet, Take 1 tablet by mouth once daily, Disp: 90 tablet, Rfl: 3   lisinopril (ZESTRIL) 40 MG tablet, Take 1 tablet (40 mg total) by mouth daily., Disp: 90 tablet, Rfl: 2   polyethylene glycol powder (GLYCOLAX/MIRALAX) powder, Take 17 g by mouth 2 (two) times daily as needed., Disp: 3350 g, Rfl: 1   Vitamin D, Cholecalciferol, 1000 units CAPS, Take 2,000 Units by mouth daily. , Disp: , Rfl:    acetaminophen (TYLENOL) 500 MG tablet, Take 1,000 mg by mouth every 6 (six) hours as needed., Disp: , Rfl:   Family History  Problem Relation Age of Onset   Diabetes Sister    Hypertension Sister    Hypertension Brother    Heart disease Brother     Past Surgical History:  Procedure Laterality Date  ABDOMINAL HYSTERECTOMY     PARATHYROIDECTOMY N/A 09/03/2017   Procedure: NECK EXPLORATION WITH LEFT INFERIOR PARATHYROIDECTOMY AND RIGHT SUPERIOR PARATHYROIDECTOMY;  Surgeon: Armandina Gemma, MD;  Location: [facility];  Service: General;  Laterality: N/A;   THYROID LOBECTOMY Right 09/03/2017   Procedure: RIGHT THYROID LOBECTOMY;  Surgeon: Armandina Gemma, MD;  Location: [facility];  Service: General;  Laterality: Right;   Review of Systems  Constitutional:  Negative for chills, fever, malaise/fatigue and weight loss.  HENT:  Negative for congestion, ear pain, hearing loss, sore throat and tinnitus.   Eyes:  Negative for blurred vision, pain and redness.  Respiratory:  Negative for cough, hemoptysis and shortness of breath.   Cardiovascular:   Negative for chest pain, palpitations, orthopnea, claudication and leg swelling.  Gastrointestinal:  Negative for abdominal pain, blood in stool, constipation, diarrhea, nausea and vomiting.  Genitourinary:  Negative for dysuria, flank pain, frequency, hematuria and urgency.  Musculoskeletal:  Negative for falls, joint pain and myalgias.  Skin:  Negative for itching and rash.  Neurological:  Negative for dizziness, tingling, speech change, weakness and headaches.  Endo/Heme/Allergies:  Negative for polydipsia. Does not bruise/bleed easily.  Psychiatric/Behavioral:  Negative for depression and memory loss. The patient is not nervous/anxious and does not have insomnia.        Objective:  BP 124/80   Pulse 64   Ht 5' 8.5" (1.74 m)   Wt 189 lb 3.2 oz (85.8 kg)   BMI 28.35 kg/m   General appearance: alert, no distress, WD/WN, African American female Skin: unremarkable HEENT: normocephalic, conjunctiva/corneas normal, sclerae anicteric, PERRLA, EOMi, nares patent, no discharge or erythema, pharynx normal Oral cavity: MMM, tongue normal, teeth in good repair Neck: supple, no lymphadenopathy, no thyromegaly, no masses, normal ROM, no bruits Chest: non tender, normal shape and expansion Heart: RRR, normal S1, S2, no murmurs Lungs: CTA bilaterally, no wheezes, rhonchi, or rales Abdomen: +bs, soft, non tender, non distended, no masses, no hepatomegaly, no splenomegaly, no bruits Back: non tender, normal ROM, no scoliosis Musculoskeletal: upper extremities non tender, no obvious deformity, normal ROM throughout, lower extremities non tender, no obvious deformity, normal ROM throughout Extremities: no edema, no cyanosis, no clubbing Pulses: 2+ symmetric, upper and lower extremities, normal cap refill Neurological: alert, oriented x 3, CN2-12 intact, strength normal upper extremities and lower extremities, sensation normal throughout, DTRs 2+ throughout, no cerebellar signs, gait  normal Psychiatric: normal affect, behavior normal, pleasant  Breast/gyn/rectal - deferred     Assessment and Plan :   Encounter Diagnoses  Name Primary?   Primary hypertension Yes   Aortic atherosclerosis ([place])    Primary hyperparathyroidism ([facility])    Osteoporosis, unspecified osteoporosis type, unspecified pathological fracture presence    Stage 3a chronic kidney disease ([place])    Mixed hyperlipidemia    Vitamin D deficiency    Encounter for health maintenance examination in adult    Encounter for screening for vascular disease    Decreased pedal pulses    Screening for heart disease      This visit was a preventative care visit, also known as wellness visit or routine physical.   Topics typically include healthy lifestyle, diet, exercise, preventative care, vaccinations, sick and well care, proper use of emergency dept and after hours care, as well as other concerns.    Separate significant issues discussed: Hyperlipidemia-compliant with Lipitor 40 mg daily  Hypertension-compliant with lisinopril 40 mg daily, lasix 20mg  daily  Thyroid disease-compliant with Synthroid levothyroxine 50 mcg daily  Osteoporosis-compliant with  Fosamax without complaint, doing fine on this medication    General Recommendations: Continue to return yearly for your annual wellness and preventative care visits.  This gives [place] a chance to discuss healthy lifestyle, exercise, vaccinations, review your chart record, and perform screenings where appropriate.  I recommend you see your eye doctor yearly for routine vision care.  I recommend you see your dentist yearly for routine dental care including hygiene visits twice yearly.   Vaccination recommendations were reviewed Immunization History  Administered Date(s) Administered   COVID-19, mRNA, vaccine(Comirnaty)12 years and older 07/15/2022   Fluad Quad(high Dose 65+) 05/03/2020, 06/26/2021, 05/12/2022   Influenza, High Dose Seasonal PF  10/23/2016, 05/22/2017, 06/14/2018, 05/18/2019   Influenza-Unspecified 05/18/2019   PFIZER(Purple Top)SARS-COV-2 Vaccination 10/22/2019, 11/19/2019, 07/02/2020   PNEUMOCOCCAL CONJUGATE-20 11/23/2021   Pfizer Covid-19 Vaccine Bivalent Booster 25yrs & up 07/31/2021   Pneumococcal Conjugate-13 10/23/2016   Pneumococcal Polysaccharide-23 10/29/2018   Tdap 10/30/2016   Zoster Recombinat (Shingrix) 02/18/2022, 04/29/2022    Screening for cancer: Colon cancer screening: Prior or last colon cancer screen: 2021 cologuard negative   Skin cancer screening: Check your skin regularly for new changes, growing lesions, or other lesions of concern Come in for evaluation if you have skin lesions of concern.  Lung cancer screening: If you have a greater than 20 pack year history of tobacco use, then you may qualify for lung cancer screening with a chest CT scan.   Please call your insurance company to inquire about coverage for this test.  Pancreatic cancer: no current screening test is available routinely recommended.  (Risk factors: Smoking, overweight or obese, diabetes, chronic pancreatitis, work Aeronautical engineer, Actuary, 42 year old or greater, female greater than female, African-American, family history of pancreatic cancer, hereditary breast, ovarian, melanoma, Lynch, Peutz-jeghers).  We currently don't have screenings for other cancers besides breast, cervical, colon, and lung cancers.  If you have a strong family history of cancer or have other cancer screening concerns, please let me know.    Bone health: Get at least 150 minutes of aerobic exercise weekly Get weight bearing exercise at least once weekly Bone density test:  A bone density test is an imaging test that uses a type of X-ray to measure the amount of calcium and other minerals in your bones. The test may be used to diagnose or screen you for a condition that causes weak or thin bones (osteoporosis), predict your risk for  a broken bone (fracture), or determine how well your osteoporosis treatment is working. The bone density test is recommended for females 102 and older, or females or males [id number] if certain risk factors such as thyroid disease, long term use of steroids such as for asthma or rheumatological issues, vitamin D deficiency, estrogen deficiency, family history of osteoporosis, self or family history of fragility fracture in first degree relative.  I reviewed 2023 bone density test   Heart health: Get at least 150 minutes of aerobic exercise weekly Limit alcohol It is important to maintain a healthy blood pressure and healthy cholesterol numbers  Heart disease screening: Screening for heart disease includes screening for blood pressure, fasting lipids, glucose/diabetes screening, BMI height to weight ratio, reviewed of smoking status, physical activity, and diet.    Goals include blood pressure 120/80 or less, maintaining a healthy lipid/cholesterol profile, preventing diabetes or keeping diabetes numbers under good control, not smoking or using tobacco products, exercising most days per week or at least 150 minutes per week of exercise, and eating healthy variety of fruits and  vegetables, healthy oils, and avoiding unhealthy food choices like fried food, fast food, high sugar and high cholesterol foods.    Other tests may possibly include EKG test, CT coronary calcium score, echocardiogram, exercise treadmill stress test.   She will consider other testing but declines today   Vascular disease screening: For high risk individuals including smokers, diabetes, patients with known heart disease or high blood pressure, kidney disease, and others, screening for vascular disease or atherosclerosis of the arteries is available.  Examples may include carotid ultrasound, abdominal aortic ultrasound, ABI blood flow screening in the legs, thoracic aorta screening.  She is agreeable to updated ABI   Medical  care options: I recommend you continue to seek care here first for routine care.  We try really hard to have available appointments Monday through Friday daytime hours for sick visits, acute visits, and physicals.  Urgent care should be used for after hours and weekends for significant issues that cannot wait till the next day.  The emergency department should be used for significant potentially life-threatening emergencies.  The emergency department is expensive, can often have long wait times for less significant concerns, so try to utilize primary care, urgent care, or telemedicine when possible to avoid unnecessary trips to the emergency department.  Virtual visits and telemedicine have been introduced since the pandemic started in 2020, and can be convenient ways to receive medical care.  We offer virtual appointments as well to assist you in a variety of options to seek medical care.   Legal Take the time to do a Last Will and Testament, advanced directives including Healthcare Power of Attorney and Living Will documents.  Do not leave your family with burdens that can be handled ahead of time.   Advanced Directives: I recommend you consider completing a [place] and Living Will.   These documents respect your wishes and help alleviate burdens on your loved ones if you were to become terminally ill or be in a position to need those documents enforced.    You can complete Advanced Directives yourself, have them notarized, then have copies made for our office, for you and for anybody you feel should have them in safe keeping.  Or, you can have an attorney prepare these documents.   If you haven't updated your Last Will and Testament in a while, it may be worthwhile having an attorney prepare these documents together and save on some costs.       Spiritual and Emotional Health Keeping a healthy spiritual life can help you better manage your physical health. Your spiritual  life can help you to cope with any issues that may arise with your physical health.  Balance can keep [place] healthy and help [place] to recover.  If you are struggling with your spiritual health there are questions that you may want to ask yourself:  What makes me feel most complete? When do I feel most connected to the rest of the world? Where do I find the most inner strength? What am I doing when I feel whole?  Helpful tips: Being in nature. Some people feel very connected and at peace when they are walking outdoors or are outside. Helping others. Some feel the largest sense of wellbeing when they are of service to others. Being of service can take on many forms. It can be doing volunteer work, being kind to strangers, or offering a hand to a friend in need. Gratitude. Some people find they feel the  most connected when they remain grateful. They may make lists of all the things they are grateful for or say a thank you out loud for all they have.    Emotional Health Are you in tune with your emotional health?  Check out this link: http://www.bray.com/   Financial Health Make sure you use a budget for your personal finances Make sure you are insured against risks (health insurance, life insurance, auto insurance, etc) Save more, spend less Set financial goals If you need help in this area, good resources include counseling through [employer] or other community resources, have a meeting with a Emergency planning/management officer, and a good resource is [employer] was seen today for fasting cpe.  Diagnoses and all orders for this visit:  Primary hypertension  Aortic atherosclerosis ([place])  Primary hyperparathyroidism ([place]) -     TSH  Osteoporosis, unspecified osteoporosis type, unspecified pathological fracture presence  Stage 3a chronic kidney disease ([place]) -     CBC with Differential/Platelet -     Renal Function Panel -     Hepatic function  panel  Mixed hyperlipidemia -     Lipid panel -     VAS [place] ABI WITH/WO TBI; Future  Vitamin D deficiency  Encounter for health maintenance examination in adult  Encounter for screening for vascular disease -     CBC with Differential/Platelet -     Renal Function Panel -     Hepatic function panel -     Lipid panel -     TSH -     VAS [place] ABI WITH/WO TBI; Future  Decreased pedal pulses -     VAS [place] ABI WITH/WO TBI; Future  Screening for heart disease   Follow-up pending labs, yearly for physical

## 2022-11-13 ENCOUNTER — Other Ambulatory Visit: Payer: Self-pay | Admitting: Medical

## 2022-11-13 DIAGNOSIS — J0140 Acute pansinusitis, unspecified: Secondary | ICD-10-CM

## 2022-11-13 DIAGNOSIS — R0981 Nasal congestion: Secondary | ICD-10-CM

## 2022-11-13 LAB — RENAL FUNCTION PANEL
Albumin: 4.3 g/dL (ref 3.8–4.8)
BUN/Creatinine Ratio: 8 — ABNORMAL LOW (ref 12–28)
BUN: 9 mg/dL (ref 8–27)
CO2: 24 mmol/L (ref 20–29)
Calcium: 9.1 mg/dL (ref 8.7–10.3)
Chloride: 106 mmol/L (ref 96–106)
Creatinine, Ser: 1.1 mg/dL — ABNORMAL HIGH (ref 0.57–1.00)
Glucose: 92 mg/dL (ref 70–99)
Phosphorus: 2.8 mg/dL — ABNORMAL LOW (ref 3.0–4.3)
Potassium: 3.6 mmol/L (ref 3.5–5.2)
Sodium: 144 mmol/L (ref 134–144)
eGFR: 51 mL/min/{1.73_m2} — ABNORMAL LOW (ref >59)

## 2022-11-13 LAB — LIPID PANEL
Chol/HDL Ratio: 3.1 ratio (ref 0.0–4.4)
Cholesterol, Total: 138 mg/dL (ref 100–199)
HDL: 45 mg/dL (ref >39)
LDL Chol Calc (NIH): 76 mg/dL (ref 0–99)
Triglycerides: 86 mg/dL (ref 0–149)
VLDL Cholesterol Cal: 17 mg/dL (ref 5–40)

## 2022-11-13 LAB — CBC WITH DIFFERENTIAL/PLATELET
Basophils Absolute: 0.1 10*3/uL (ref 0.0–0.2)
Basos: 1 %
EOS (ABSOLUTE): 0.1 10*3/uL (ref 0.0–0.4)
Eos: 1 %
Hematocrit: 40.7 % (ref 34.0–46.6)
Hemoglobin: 13.3 g/dL (ref 11.1–15.9)
Immature Grans (Abs): 0 10*3/uL (ref 0.0–0.1)
Immature Granulocytes: 0 %
Lymphocytes Absolute: 1.1 10*3/uL (ref 0.7–3.1)
Lymphs: 17 %
MCH: 27.3 pg (ref 26.6–33.0)
MCHC: 32.7 g/dL (ref 31.5–35.7)
MCV: 83 fL (ref 79–97)
Monocytes Absolute: 0.3 10*3/uL (ref 0.1–0.9)
Monocytes: 5 %
Neutrophils Absolute: 4.6 10*3/uL (ref 1.4–7.0)
Neutrophils: 76 %
Platelets: 177 10*3/uL (ref 150–450)
RBC: 4.88 x10E6/uL (ref 3.77–5.28)
RDW: 13.3 % (ref 11.7–15.4)
WBC: 6.1 10*3/uL (ref 3.4–10.8)

## 2022-11-13 LAB — HEPATIC FUNCTION PANEL
ALT: 6 IU/L (ref 0–32)
AST: 13 IU/L (ref 0–40)
Alkaline Phosphatase: 57 IU/L (ref 44–121)
Bilirubin Total: 1.5 mg/dL — ABNORMAL HIGH (ref 0.0–1.2)
Bilirubin, Direct: 0.35 mg/dL (ref 0.00–0.40)
Total Protein: 6.7 g/dL (ref 6.0–8.5)

## 2022-11-13 LAB — TSH: TSH: 1.73 u[IU]/mL (ref 0.450–4.500)

## 2022-11-13 MED ORDER — VITAMIN D (CHOLECALCIFEROL) 25 MCG (1000 UT) PO CAPS: 2000.0000 [IU] | ORAL_CAPSULE | Freq: Every day | ORAL | 3 refills | Status: AC

## 2022-11-13 MED ORDER — ATORVASTATIN CALCIUM 40 MG PO TABS: 40.0000 mg | ORAL_TABLET | Freq: Every day | ORAL | 3 refills | Status: DC

## 2022-11-13 MED ORDER — FUROSEMIDE 20 MG PO TABS: 10.0000 mg | ORAL_TABLET | Freq: Every day | ORAL | 2 refills | Status: DC

## 2022-11-13 NOTE — Progress Notes (Signed)
Thyroid labs normal, cholesterol ok, blood counts normal, liver test okay, kidney marker stable, phosphorus slightly low  I recommend you eat 1/2- 1 cup blueberries daily to help increase your phosphorous  Continue current medications  Follow up in 6 months for med check

## 2022-11-20 ENCOUNTER — Other Ambulatory Visit: Payer: Self-pay | Admitting: Internal Medicine

## 2022-12-17 ENCOUNTER — Ambulatory Visit (HOSPITAL_COMMUNITY)
Admission: RE | Admit: 2022-12-17 | Discharge: 2022-12-17 | Disposition: A | Discharge status: Home / Self Care | Payer: Medicare HMO | Source: Ambulatory Visit | Attending: Medical | Admitting: Medical

## 2022-12-17 DIAGNOSIS — E782 Mixed hyperlipidemia: Secondary | ICD-10-CM | POA: Insufficient documentation

## 2022-12-17 DIAGNOSIS — Z136 Encounter for screening for cardiovascular disorders: Secondary | ICD-10-CM | POA: Insufficient documentation

## 2022-12-17 DIAGNOSIS — R0989 Other specified symptoms and signs involving the circulatory and respiratory systems: Secondary | ICD-10-CM | POA: Diagnosis not present

## 2022-12-17 LAB — VAS US ABI WITH/WO TBI
Left ABI: 1.04
Right ABI: 1.07

## 2022-12-17 NOTE — Progress Notes (Signed)
Your ABI blood flow screening in your legs is normal thankfully

## 2022-12-18 NOTE — Progress Notes (Signed)
Thankfully your blood flow study screening is normal in your legs

## 2023-01-07 ENCOUNTER — Other Ambulatory Visit: Payer: Self-pay | Admitting: Medical

## 2023-01-07 DIAGNOSIS — Z0000 Encounter for general adult medical examination without abnormal findings: Secondary | ICD-10-CM

## 2023-01-30 ENCOUNTER — Ambulatory Visit
Admission: RE | Admit: 2023-01-30 | Discharge: 2023-01-30 | Disposition: A | Discharge status: Home / Self Care | Payer: Medicare HMO | Source: Ambulatory Visit | Attending: Medical | Admitting: Medical

## 2023-01-30 DIAGNOSIS — Z1231 Encounter for screening mammogram for malignant neoplasm of breast: Secondary | ICD-10-CM | POA: Diagnosis not present

## 2023-01-30 DIAGNOSIS — Z0000 Encounter for general adult medical examination without abnormal findings: Secondary | ICD-10-CM

## 2023-02-02 NOTE — Progress Notes (Signed)
I am happy to report that her mammogram was normal, no worrisome findings.

## 2023-02-10 ENCOUNTER — Other Ambulatory Visit: Payer: Self-pay | Admitting: Internal Medicine

## 2023-04-07 ENCOUNTER — Other Ambulatory Visit: Payer: Self-pay | Admitting: Medical

## 2023-04-07 DIAGNOSIS — I10 Essential (primary) hypertension: Secondary | ICD-10-CM

## 2023-05-07 ENCOUNTER — Other Ambulatory Visit: Payer: Self-pay | Admitting: Internal Medicine

## 2023-05-18 ENCOUNTER — Encounter: Payer: Self-pay | Admitting: Medical

## 2023-05-18 ENCOUNTER — Ambulatory Visit (INDEPENDENT_AMBULATORY_CARE_PROVIDER_SITE_OTHER): Payer: Medicare HMO | Admitting: Medical

## 2023-05-18 VITALS — BP 122/80 | HR 60 | Wt 186.4 lb

## 2023-05-18 DIAGNOSIS — E559 Vitamin D deficiency, unspecified: Secondary | ICD-10-CM | POA: Diagnosis not present

## 2023-05-18 DIAGNOSIS — I10 Essential (primary) hypertension: Secondary | ICD-10-CM

## 2023-05-18 DIAGNOSIS — Z23 Encounter for immunization: Secondary | ICD-10-CM

## 2023-05-18 DIAGNOSIS — N1831 Chronic kidney disease, stage 3a: Secondary | ICD-10-CM

## 2023-05-18 DIAGNOSIS — E039 Hypothyroidism, unspecified: Secondary | ICD-10-CM

## 2023-05-18 DIAGNOSIS — M810 Age-related osteoporosis without current pathological fracture: Secondary | ICD-10-CM | POA: Diagnosis not present

## 2023-05-18 DIAGNOSIS — E782 Mixed hyperlipidemia: Secondary | ICD-10-CM | POA: Diagnosis not present

## 2023-05-18 DIAGNOSIS — I700 Atherosclerosis of aorta: Secondary | ICD-10-CM

## 2023-05-18 NOTE — Progress Notes (Signed)
Subjective:  Tracy Rowe is a 77 y.o. female who presents for Chief Complaint  Patient presents with   Medical Management of Chronic Issues    6 month follow-up. Would like flu and covid, no concerns     Here for 19-month med check  Hypertension-continuing lisinopril 40 mg daily, Lasix 20 mg, half tablet daily  Hyperlipidemia-compliant with Lipitor 40 mg daily  Hypothyroidism-continue levothyroxine 50 mcg  Osteoporosis-currently on Fosamax weekly.  She thinks she has taken this longer than 2 years.  Vitamin D deficiency-compliant with vitamin D 2000 u daily  No other aggravating or relieving factors.    No other c/o.  Past Medical History:  Diagnosis Date   Aortic atherosclerosis ([facility]) 06/28/2019   HTN (hypertension)    Hyperlipidemia    Hypertension 09/25/2016   Osteoporosis    Current Outpatient Medications on File Prior to Visit  Medication Sig Dispense Refill   acetaminophen (TYLENOL) 500 MG tablet Take 1,000 mg by mouth every 6 (six) hours as needed.     alendronate (FOSAMAX) 70 MG tablet TAKE 1 TABLET BY MOUTH ONCE A WEEK ON AN EMPTY STOMACH WITH FULL GLASS OF WATER 12 tablet 0   atorvastatin (LIPITOR) 40 MG tablet Take 1 tablet (40 mg total) by mouth daily. 90 tablet 3   fluticasone (FLONASE) 50 MCG/ACT nasal spray Place 2 sprays into both nostrils daily. 16 g 1   furosemide (LASIX) 20 MG tablet Take 0.5 tablets (10 mg total) by mouth daily. 90 tablet 2   levothyroxine (SYNTHROID) 50 MCG tablet Take 1 tablet by mouth once daily 90 tablet 3   lisinopril (ZESTRIL) 40 MG tablet Take 1 tablet by mouth once daily 90 tablet 1   polyethylene glycol powder (GLYCOLAX/MIRALAX) powder Take 17 g by mouth 2 (two) times daily as needed. 3350 g 1   Vitamin D, Cholecalciferol, 25 MCG (1000 UT) CAPS Take 2,000 Units by mouth daily. 90 capsule 3   No current facility-administered medications on file prior to visit.     The following portions of the patient's history were reviewed  and updated as appropriate: allergies, current medications, past family history, past medical history, past social history, past surgical history and problem list.  ROS Otherwise as in subjective above  Objective: BP 122/80   Pulse 60   Wt 186 lb 6.4 oz (84.6 kg)   BMI 27.93 kg/m   General appearance: alert, no distress, well developed, well nourished Neck: supple, no lymphadenopathy, no thyromegaly, no masses Heart: RRR, normal S1, S2, no murmurs Lungs: CTA bilaterally, no wheezes, rhonchi, or rales Pulses: 2+ radial pulses, 2+ pedal pulses, normal cap refill Ext: no edema     Assessment: Encounter Diagnoses  Name Primary?   Primary hypertension Yes   Needs flu shot    COVID-19 vaccine administered    Stage 3a chronic kidney disease (HCC)    Vitamin D deficiency    Osteoporosis, unspecified osteoporosis type, unspecified pathological fracture presence    Mixed hyperlipidemia    Aortic atherosclerosis ([facility])    Hypothyroidism, unspecified type      Plan: Hypertension-continue lisinopril 40 mg daily, Lasix 20 mg half tablet daily.  Hypothyroidism-continue 50 mcg daily, labs today  Hyperlipidemia-continue statin, atorvastatin 40 mg daily  Osteoporosis-we discussed this in greater detail today.  Counseled on weightbearing and aerobic exercise.  Continue vitamin D supplement.  Continue Fosamax.  Plan for repeat bone density test February 2024.  She is not exactly sure how long she has been on  Fosamax but we notes at least a year and a half.  She thinks she may have been on this even longer.  She will work on weightbearing exercise and plans repeat bone density test early next year.  CKD 3A-stable.  Counseled on avoiding NSAIDs, avoid dehydration.  Aortic atherosclerosis-continue statin.   Vaccines: Counseled on the influenza virus vaccine.  Vaccine information sheet given.  Influenza vaccine given after consent obtained.  Counseled on the Covid virus vaccine.  Vaccine  information sheet given.  Covid vaccine given after consent obtained.   Jadee was seen today for medical management of chronic issues.  Diagnoses and all orders for this visit:  Primary hypertension  Needs flu shot -     Cancel: Flu vaccine trivalent PF, 6mos and older(Flulaval,Afluria,Fluarix,Fluzone) -     Flu Vaccine Trivalent High Dose (Fluad)  COVID-19 vaccine administered -     Pfizer Comirnaty Covid -19 Vaccine 12yrs and older  Stage 3a chronic kidney disease (HCC)  Vitamin D deficiency  Osteoporosis, unspecified osteoporosis type, unspecified pathological fracture presence  Mixed hyperlipidemia  Aortic atherosclerosis (H[facility])  Hypothyroidism, unspecified type -     TSH + free T4    Follow up: pending labs

## 2023-05-19 ENCOUNTER — Other Ambulatory Visit: Payer: Self-pay | Admitting: Medical

## 2023-05-19 LAB — TSH+FREE T4
Free T4: 1.41 ng/dL (ref 0.82–1.77)
TSH: 2.32 u[IU]/mL (ref 0.450–4.500)

## 2023-05-19 MED ORDER — FUROSEMIDE 20 MG PO TABS: 10.0000 mg | ORAL_TABLET | Freq: Every day | ORAL | 2 refills | Status: DC

## 2023-05-19 NOTE — Progress Notes (Signed)
Thyroid stable, continue medications as usual.  Follow-up for yearly physical in 6 months

## 2023-06-15 ENCOUNTER — Other Ambulatory Visit: Payer: Self-pay | Admitting: Medical

## 2023-06-15 DIAGNOSIS — M810 Age-related osteoporosis without current pathological fracture: Secondary | ICD-10-CM

## 2023-07-22 ENCOUNTER — Other Ambulatory Visit: Payer: Self-pay | Admitting: Internal Medicine

## 2023-07-27 ENCOUNTER — Ambulatory Visit: Payer: Medicare HMO | Admitting: Internal Medicine

## 2023-07-27 VITALS — BP 122/80 | HR 72 | Ht 68.5 in | Wt 187.0 lb

## 2023-07-27 DIAGNOSIS — M8580 Other specified disorders of bone density and structure, unspecified site: Secondary | ICD-10-CM | POA: Diagnosis not present

## 2023-07-27 DIAGNOSIS — M899 Disorder of bone, unspecified: Secondary | ICD-10-CM

## 2023-07-27 DIAGNOSIS — E210 Primary hyperparathyroidism: Secondary | ICD-10-CM

## 2023-07-27 DIAGNOSIS — E559 Vitamin D deficiency, unspecified: Secondary | ICD-10-CM

## 2023-07-27 DIAGNOSIS — M949 Disorder of cartilage, unspecified: Secondary | ICD-10-CM

## 2023-07-27 DIAGNOSIS — E041 Nontoxic single thyroid nodule: Secondary | ICD-10-CM

## 2023-07-27 DIAGNOSIS — E890 Postprocedural hypothyroidism: Secondary | ICD-10-CM

## 2023-07-27 MED ORDER — ALENDRONATE SODIUM 70 MG PO TABS: 70.0000 mg | ORAL_TABLET | ORAL | 3 refills | Status: DC

## 2023-07-27 MED ORDER — LEVOTHYROXINE SODIUM 50 MCG PO TABS: 50.0000 ug | ORAL_TABLET | Freq: Every day | ORAL | 3 refills | Status: DC

## 2023-07-27 NOTE — Patient Instructions (Signed)
Please continue Levothyroxine 50 mcg daily.  Take the thyroid hormone every day, with water, at least 30 minutes before breakfast, separated by at least 4 hours from: - acid reflux medications - calcium - iron - multivitamins  Please stop at the lab.  Continue vitamin D 2000 units daily.  Please come back for a follow-up appointment in 1 year.

## 2023-07-27 NOTE — Progress Notes (Unsigned)
Patient ID: LEE-ANNE LEIGHT, female   DOB: [birth year], 77 y.o.   MRN: [id number]   HPI  MARLO BIRCHENOUGH is a 77 y.o. female, returning for follow-up for h/o primary hyperparathyroidism, vitamin D deficiency, osteopenia, thyroid nodule, and now postsurgical hypothyroidism diagnosed after last visit. Last visit 1 year ago.  Interim history: No falls or fractures since last visit. No consistent exercise.  She is occasionally walking. She had R sciatica at last OV >> resolved. She continues to have night sweats and sometimes hot flushes during the day. She otherwise has no complaints today.  Primary hyperparathyroidism: She was diagnosed with hypercalcemia in 09/2016 but she had a technetium sestamibi scan that was positive for parathyroid adenoma in 2002.  Reviewed pertinent labs: Lab Results  Component Value Date   PTH 44 05/12/2022   PTH Comment 05/12/2022   PTH 52 12/23/2017   PTH Comment 12/23/2017   PTH 95 (H) 08/14/2017   PTH Comment 08/14/2017   PTH 55 03/02/2017   PTH 142 (H) 11/26/2016   CALCIUM 9.1 11/12/2022   CALCIUM 9.8 06/30/2022   CALCIUM 9.6 05/27/2022   CALCIUM 9.3 05/12/2022   CALCIUM 9.4 11/07/2021   CALCIUM 9.5 05/06/2021   CALCIUM 9.3 11/08/2020   CALCIUM 9.5 10/31/2020   CALCIUM 9.4 05/03/2020   CALCIUM 9.5 12/06/2019   She had an indeterminate Tc sestamibi scan (2002): NO DEFINITE PARATHYROID ADENOPATHY LOCALIZED IN THE NECK OR CHEST ALTHOUGH THERE IS A FOCUS OF MILDLY PROMINENT ACTIVITY AT THE INFERIOR POLE OF THE LEFT LOBE OF THE THYROID WHICH WOULD BE THE MOST LIKELY SITE ON THIS SCAN.  Other labs reviewed: Component     Latest Ref Rng & Units 03/02/2017  Vitamin D 1, 25 (OH) Total     18 - 72 pg/mL 68  Vitamin D3 1, 25 (OH)     pg/mL 44  Vitamin D2 1, 25 (OH)     pg/mL 24  Phosphorus     2.3 - 4.6 mg/dL 2.4  Magnesium     1.5 - 2.5 mg/dL 1.8   Component     Latest Ref Rng & Units 03/04/2017  Creatinine, Urine     20 - 320 mg/dL 63   Creatinine, 76H Ur     0.63 - 2.50 g/24 h 0.50 (L)  Calcium, Ur     Not estab mg/dL 3  Calcium, 24 hour urine     35 - 250 mg/24 h 24 (L)  Urinary calcium was low, but the creatinine was also low.  I did not ask her to repeat a collection, since even a higher creatinine level will most likely not push her over the limit for hypercalciuria.    She had the following investigations:  Technetium sestamibi parathyroid scan (05/05/2017): Left inferior parathyroid adenoma and a possible right superior parathyroid adenoma versus thyroid nodule.   Thyroid ultrasound (05/20/2017): Several nodules, of which one right superior measuring 1.6 x 1.3 x 1.3, solid, hypoechoic; another left inferior 1.9 x 1.3 x 1.3 cm, mixed, hypoechoic, presumed to be an enlarged parathyroid gland  FNA of the 1.6 cm nodule (07/08/2017): Scant epithelium (Bethesda category 1)  She had parathyroidectomy + right lobectomy on 09/03/2017.  Left inferior parathyroid adenoma was resected.  The right superior parathyroid did not contain an adenoma.  R Thyroid nodule was benign.  Diagnosis 1. Parathyroid gland, left inferior - HYPERCELLULAR PARATHYROID TISSUE CONSISTENT WITH ADENOMA 2. Parathyroid gland, right superior - PARATHYROID TISSUE 3. Thyroid, lobectomy, right lobe - BENIGN  FOLLICULAR ADENOMA (1.2 CM) The right thyroid lobe has an adenomatous lesion which has a very thin fibrous capsule consistent with follicular adenoma. There is no evidence of capsular or vascular invasion. The surrounding thyroid parenchyma is unremarkable.  Calcium normalized after surgery.  Osteopenia: Reviewed previous DXA scan reports ([facility]): Date L1-L4 T score FN T score  10/16/2021 L2, L3: -2.2 L1-L3 (L4): -1.7 (+2.5%) RFN: -2.3 LFN: -2.1 Overall: -2.4%  05/25/2019 L2, L3: -2.3 (+1.3%) L1-L3 (L4): -1.9 RFN: -2.1 LFN: -1.9 Overall: +10.8%*  11/19/2016 L2, L3:-2.4 RFN: -2.3 LFN: -2.3  08/09/2007 L1, L3, L4: -2.0 RFN: n/a LFN:  -1.8  Per review of the chart, she was approved for Prolia.  We started Fosamax 70 mg weekly in 07/2021.  She takes this consistently.  No side effects.  No history of kidney stones.  + Mild CKD.  Reviewed BUN/Cr: Lab Results  Component Value Date   BUN 9 11/12/2022   BUN 18 06/30/2022   BUN 11 05/27/2022   BUN 15 05/12/2022   BUN 15 11/07/2021   BUN 13 05/06/2021   BUN 12 11/08/2020   BUN 12 10/31/2020   BUN 15 05/03/2020   BUN 11 12/06/2019   Lab Results  Component Value Date   CREATININE 1.10 (H) 11/12/2022   CREATININE 1.23 (H) 06/30/2022   CREATININE 1.19 (H) 05/27/2022   CREATININE 1.20 (H) 05/12/2022   CREATININE 1.03 (H) 11/07/2021   CREATININE 1.18 (H) 05/06/2021   CREATININE 1.20 (H) 11/08/2020   CREATININE 1.14 (H) 10/31/2020   CREATININE 1.14 (H) 05/03/2020   CREATININE 1.10 (H) 12/06/2019   She was previously on HCTZ in the past but we changed to Lasix during investigation for hyperparathyroidism.  History of vitamin D deficiency:  Reviewed her vitamin D levels: Lab Results  Component Value Date   VD25OH 53.74 07/23/2022   VD25OH 44.40 07/23/2021   VD25OH 37.8 05/03/2020   VD25OH 44.6 10/31/2019   VD25OH 37.23 03/01/2019   VD25OH 39.38 12/23/2017   VD25OH 38.66 08/14/2017   VD25OH 26.51 (L) 05/15/2017   VD25OH 31.30 03/02/2017   VD25OH 30 12/29/2016   She takes 2000 units of vitamin D daily.  She was previously on ergocalciferol.  Pt does not have a FH of osteoporosis.  She also has HTN.  Postsurgical hypothyroidism  -Developed after her right hemithyroidectomy in 2019  In 02/2019 I advised her to start levothyroxine 25 mcg daily.  She develops hot flashes and stopped.  In 10/2019, TSH was still high so I advised her to restart the lower dose of levothyroxine. She started, but she again developed hot flashes and itching right after taking the levothyroxine tablet.  We switched to Tirosint (liquid levothyroxine) and I advised her to increase the  dose to 50 mcg daily.  However, at last visit, she was only taking 12.5 mcg.  I advised her to increase the dose to 50 mcg daily.  Since then, we had to stop Tirosint due to insurance coverage and switch to Synthroid d.a.w. . However, this was $$$ >> we switched to generic in 12/2020.  She takes the levothyroxine (not on Tirosint due to $$): - daily - in am - fasting - no coffee - at least 30 min from b'fast - no calcium - no iron - no multivitamins - no PPIs - not on Biotin - 30 min after Fosamax  Reviewed her TFTs: Lab Results  Component Value Date   TSH 2.320 05/18/2023   TSH 1.730 11/12/2022   TSH  1.490 05/12/2022   TSH 0.54 07/23/2021   TSH 0.47 01/03/2021   TSH 11.06 (H) 02/29/2020   TSH 10.100 (H) 10/31/2019   TSH 11.65 (H) 03/01/2019   TSH 4.42 08/31/2018   TSH 5.00 (H) 05/31/2018   FREET4 1.41 05/18/2023   FREET4 1.42 05/12/2022   FREET4 1.23 07/23/2021   FREET4 1.16 01/03/2021   FREET4 0.90 02/29/2020   FREET4 1.07 10/31/2019   FREET4 0.73 03/01/2019   FREET4 0.89 08/31/2018   FREET4 0.81 05/31/2018   FREET4 1.18 05/03/2018   T3FREE 2.6 03/01/2019   T3FREE 2.6 08/31/2018   T3FREE 2.5 05/31/2018   T3FREE 3.2 12/23/2017   ROS: + see HPI  I reviewed pt's medications, allergies, PMH, social hx, family hx, and changes were documented in the history of present illness. Otherwise, unchanged from my initial visit note.  Past Medical History:  Diagnosis Date   Aortic atherosclerosis (HCC) 06/28/2019   HTN (hypertension)    Hyperlipidemia    Hypertension 09/25/2016   Osteoporosis    Past Surgical History:  Procedure Laterality Date   ABDOMINAL HYSTERECTOMY     PARATHYROIDECTOMY N/A 09/03/2017   Procedure: NECK EXPLORATION WITH LEFT INFERIOR PARATHYROIDECTOMY AND RIGHT SUPERIOR PARATHYROIDECTOMY;  Surgeon: Darnell Level, MD;  Location: [facility];  Service: General;  Laterality: N/A;   THYROID LOBECTOMY Right 09/03/2017   Procedure: RIGHT THYROID LOBECTOMY;   Surgeon: Darnell Level, MD;  Location: [facility];  Service: General;  Laterality: Right;   Social History   Socioeconomic History   Marital status: Married    Spouse name: Not on file   Number of children: Not on file   Years of education: Not on file   Highest education level: Not on file  Occupational History   Not on file  Tobacco Use   Smoking status: Never   Smokeless tobacco: Never  Vaping Use   Vaping status: Never Used  Substance and Sexual Activity   Alcohol use: No   Drug use: No   Sexual activity: Never  Other Topics Concern   Not on file  Social History Narrative   Not on file   Social Determinants of Health   Financial Resource Strain: Low Risk  (11/11/2022)   Overall Financial Resource Strain (CARDIA)    Difficulty of Paying Living Expenses: Not hard at all  Food Insecurity: No Food Insecurity (11/11/2022)   Hunger Vital Sign    Worried About Running Out of Food in the Last Year: Never true    Ran Out of Food in the Last Year: Never true  Transportation Needs: No Transportation Needs (11/11/2022)   PRAPARE - Administrator, Civil Service (Medical): No    Lack of Transportation (Non-Medical): No  Physical Activity: Inactive (11/11/2022)   Exercise Vital Sign    Days of Exercise per Week: 0 days    Minutes of Exercise per Session: 0 min  Stress: No Stress Concern Present (11/11/2022)   [employer] of Occupational Health - Occupational Stress Questionnaire    Feeling of Stress : Not at all  Social Connections: Not on file  Intimate Partner Violence: Not on file   Current Outpatient Medications on File Prior to Visit  Medication Sig Dispense Refill   acetaminophen (TYLENOL) 500 MG tablet Take 1,000 mg by mouth every 6 (six) hours as needed.     alendronate (FOSAMAX) 70 MG tablet TAKE 1 TABLET BY MOUTH ONCE A WEEK ON AN EMPTY STOMACH WITH FULL GLASS OF WATER 12 tablet 0  atorvastatin (LIPITOR) 40 MG tablet Take 1 tablet (40 mg total) by mouth  daily. 90 tablet 3   fluticasone (FLONASE) 50 MCG/ACT nasal spray Place 2 sprays into both nostrils daily. 16 g 1   furosemide (LASIX) 20 MG tablet Take 0.5 tablets (10 mg total) by mouth daily. 90 tablet 2   levothyroxine (SYNTHROID) 50 MCG tablet Take 1 tablet by mouth once daily 90 tablet 0   lisinopril (ZESTRIL) 40 MG tablet Take 1 tablet by mouth once daily 90 tablet 1   polyethylene glycol powder (GLYCOLAX/MIRALAX) powder Take 17 g by mouth 2 (two) times daily as needed. 3350 g 1   Vitamin D, Cholecalciferol, 25 MCG (1000 UT) CAPS Take 2,000 Units by mouth daily. 90 capsule 3   No current facility-administered medications on file prior to visit.   No Known Allergies Family History  Problem Relation Age of Onset   Diabetes Sister    Hypertension Sister    Hypertension Brother    Heart disease Brother    PE: BP 122/80   Pulse 72   Ht 5' 8.5" (1.74 m)   Wt 187 lb (84.8 kg)   SpO2 99%   BMI 28.02 kg/m  Wt Readings from Last 3 Encounters:  07/27/23 187 lb (84.8 kg)  05/18/23 186 lb 6.4 oz (84.6 kg)  11/12/22 189 lb 3.2 oz (85.8 kg)   Constitutional: overweight, in NAD Eyes:  EOMI, no exophthalmos ENT: no neck masses, no cervical lymphadenopathy Cardiovascular: RRR, No MRG Respiratory: CTA B Musculoskeletal: no deformities Skin:no rashes Neurological: no tremor with outstretched hands  Assessment: 1. Hypercalcemia/Primary hyperparathyroidism  2.  Osteopenia  3. Vit D def  4.  Thyroid nodule  5.  Postsurgical hypothyroidism  Plan: 1. Patient with history of primary hyperparathyroidism with a previous calcium level being as high as 11.1 and a PTH as high as 142.  The hyperparathyroidism persisted even after normalization of her vitamin D.  Of note, she does have CKD which can raise the PTH, but not severe enough to cause this degree of hyperparathyroidism.  Investigation pointed towards a left inferior and right superior parathyroid adenomas.  Thyroid ultrasound  showed a left inferior nodule, most consistent with a parathyroid adenoma and she had a thyroid nodule that was found incidentally at the time of her surgery.  Thyroid ultrasound showed a left inferior nodule, most consistent with a parathyroid adenoma and she had a thyroid nodule that was incidentally found at the time of her surgery-this was benign -She had 2 gland parathyroidectomy in 2019 and pathology showed that only the left inferior gland was adenomatous -Her PTH and calcium levels normalized after surgery.  Calcium level was normal on 06/30/2022.  The PTH was normal 05/12/2022.  Most recent calcium level was also normal in 10/2022: 9.1. -We will repeat her vitamin D level today  2.  Osteopenia -No falls or fractures since last visit -Reviewed her DXA scan reports from 10/2017 and 04/2019: At the level of the spine, her T score is approximately the same (increased from -2.4 to -2.3), however, at the level of the femoral necks, bone density increased by 10.8%* -Since her T-scores were in the osteopenic range, we discussed about following her without medications and repeat the bone density scan, after which we can see if she needed antiresorptive medication.  However, DXA scan results from 09/2021 show borderline osteoporosis.  I suggested Fosamax. -She continues on Fosamax 70 mg weekly, without side effects.  She takes this correctly.  No  jaw/thigh/hip pain. -She was approved for Prolia in the past, in case we need to start this -We will repeat her bone density scan in 12/2023 (already ordered by PCP)  3. Vit D def -She continues on 2000 units vitamin D daily -Vitamin D level was normal at last check 06/2022 -We will recheck her vitamin D level today  4. Thyroid nodule -Per the latest thyroid ultrasound report from 04/2017, she had a right-sided thyroid nodule, with the largest dimension being 1.6 cm.  Biopsy of this nodule was inconclusive due to scant epithelial cells and we discussed about  repeating the biopsy but she refused to have another biopsy due to discomfort at the time of the first biopsy. -She had a right lobectomy at the time of her parathyroid surgery and the final pathology was benign -No neck compression symptoms  5.  Postsurgical hypothyroidism -Previously uncontrolled -She previously had hot flashes from levothyroxine so we switched to Tirosint due to itching with the levothyroxine tablet.  However, this was not covered so we switched to Synthroid d.a.w.  Afterwards, since this was also expensive, we retried generic levothyroxine -currently on 50 mcg daily dose (white tablets, without dyes) -Latest thyroid test reviewed with patient: Normal 2 months ago: Lab Results  Component Value Date   TSH 2.320 05/18/2023  - pt feels good on this dose. - we discussed about taking the thyroid hormone every day, with water, >30 minutes before breakfast, separated by >4 hours from acid reflux medications, calcium, iron, multivitamins. Pt. is taking it correctly.  Carlus Pavlov, MD PhD [facility]

## 2023-07-28 ENCOUNTER — Encounter: Payer: Self-pay | Admitting: Internal Medicine

## 2023-07-28 LAB — VITAMIN D 25 HYDROXY (VIT D DEFICIENCY, FRACTURES): Vit D, 25-Hydroxy: 46 ng/mL (ref 30–100)

## 2023-09-08 ENCOUNTER — Other Ambulatory Visit: Payer: Self-pay | Admitting: Medical

## 2023-09-08 DIAGNOSIS — Z1211 Encounter for screening for malignant neoplasm of colon: Secondary | ICD-10-CM

## 2023-09-15 DIAGNOSIS — Z1211 Encounter for screening for malignant neoplasm of colon: Secondary | ICD-10-CM | POA: Diagnosis not present

## 2023-09-21 LAB — COLOGUARD: COLOGUARD: NEGATIVE

## 2023-09-21 NOTE — Progress Notes (Signed)
 Your Cologuard screening for colon cancer was negative.  This indicates a lower likelihood that colorectal cancer is present.   Lets plan to repeat this in 3 years.  However, if you develop bowel changes, blood in stool, unexpected weight loss, or other new bowel changes, then recheck.

## 2023-09-27 ENCOUNTER — Other Ambulatory Visit: Payer: Self-pay | Admitting: Medical

## 2023-09-27 DIAGNOSIS — I10 Essential (primary) hypertension: Secondary | ICD-10-CM

## 2023-11-16 ENCOUNTER — Encounter: Payer: Self-pay | Admitting: Medical

## 2023-11-16 ENCOUNTER — Ambulatory Visit: Payer: Medicare HMO | Admitting: Medical

## 2023-11-16 VITALS — BP 120/80 | HR 84 | Ht 68.0 in | Wt 189.6 lb

## 2023-11-16 DIAGNOSIS — E559 Vitamin D deficiency, unspecified: Secondary | ICD-10-CM

## 2023-11-16 DIAGNOSIS — N1831 Chronic kidney disease, stage 3a: Secondary | ICD-10-CM

## 2023-11-16 DIAGNOSIS — I700 Atherosclerosis of aorta: Secondary | ICD-10-CM | POA: Diagnosis not present

## 2023-11-16 DIAGNOSIS — E039 Hypothyroidism, unspecified: Secondary | ICD-10-CM | POA: Diagnosis not present

## 2023-11-16 DIAGNOSIS — E782 Mixed hyperlipidemia: Secondary | ICD-10-CM | POA: Diagnosis not present

## 2023-11-16 DIAGNOSIS — I10 Essential (primary) hypertension: Secondary | ICD-10-CM | POA: Diagnosis not present

## 2023-11-16 DIAGNOSIS — Z9189 Other specified personal risk factors, not elsewhere classified: Secondary | ICD-10-CM | POA: Insufficient documentation

## 2023-11-16 DIAGNOSIS — Z0000 Encounter for general adult medical examination without abnormal findings: Secondary | ICD-10-CM | POA: Diagnosis not present

## 2023-11-16 DIAGNOSIS — K5909 Other constipation: Secondary | ICD-10-CM

## 2023-11-16 DIAGNOSIS — M810 Age-related osteoporosis without current pathological fracture: Secondary | ICD-10-CM | POA: Diagnosis not present

## 2023-11-16 LAB — POCT URINALYSIS DIP (PROADVANTAGE DEVICE)
Bilirubin, UA: NEGATIVE
Blood, UA: NEGATIVE
Glucose, UA: NEGATIVE mg/dL
Ketones, POC UA: NEGATIVE mg/dL
Nitrite, UA: NEGATIVE
Protein Ur, POC: NEGATIVE mg/dL
Specific Gravity, Urine: 1.015
Urobilinogen, Ur: NEGATIVE
pH, UA: 6 (ref 5.0–8.0)

## 2023-11-16 NOTE — Progress Notes (Signed)
 Subjective:   HPI  Tracy Rowe is a 78 y.o. female who presents for Chief Complaint  Patient presents with   Annual Exam    Fasting cpe has AWV tomorrow by phone, no concerns    Patient Care Team: Collins Kerby, Kermit Balo, PA-C as PCP - General (Family Medicine) Carlus Pavlov, MD as Consulting Physician (Internal Medicine) Dr. Aldean Baker, orthopedics Dr. Asencion Islam, podiatry Has dentures, not seeing dentist Eye doctor    Concerns: Here for well visit.  Exercise - some  Does some gardening with flowers   Reviewed their medical, surgical, family, social, medication, and allergy history and updated chart as appropriate.  No Known Allergies  Past Medical History:  Diagnosis Date   Aortic atherosclerosis (HCC) 06/28/2019   HTN (hypertension)    Hyperlipidemia    Hypertension 09/25/2016   Osteoporosis     Current Outpatient Medications on File Prior to Visit  Medication Sig Dispense Refill   acetaminophen (TYLENOL) 500 MG tablet Take 1,000 mg by mouth every 6 (six) hours as needed.     alendronate (FOSAMAX) 70 MG tablet Take 1 tablet (70 mg total) by mouth once a week. Take with a full glass of water on an empty stomach. 12 tablet 3   atorvastatin (LIPITOR) 40 MG tablet Take 1 tablet (40 mg total) by mouth daily. 90 tablet 3   fluticasone (FLONASE) 50 MCG/ACT nasal spray Place 2 sprays into both nostrils daily. 16 g 1   furosemide (LASIX) 20 MG tablet Take 0.5 tablets (10 mg total) by mouth daily. 90 tablet 2   levothyroxine (SYNTHROID) 50 MCG tablet Take 1 tablet (50 mcg total) by mouth daily. 90 tablet 3   lisinopril (ZESTRIL) 40 MG tablet Take 1 tablet by mouth once daily 90 tablet 2   polyethylene glycol powder (GLYCOLAX/MIRALAX) powder Take 17 g by mouth 2 (two) times daily as needed. 3350 g 1   Vitamin D, Cholecalciferol, 25 MCG (1000 UT) CAPS Take 2,000 Units by mouth daily. 90 capsule 3   No current facility-administered medications on file prior to visit.       Current Outpatient Medications:    acetaminophen (TYLENOL) 500 MG tablet, Take 1,000 mg by mouth every 6 (six) hours as needed., Disp: , Rfl:    alendronate (FOSAMAX) 70 MG tablet, Take 1 tablet (70 mg total) by mouth once a week. Take with a full glass of water on an empty stomach., Disp: 12 tablet, Rfl: 3   atorvastatin (LIPITOR) 40 MG tablet, Take 1 tablet (40 mg total) by mouth daily., Disp: 90 tablet, Rfl: 3   fluticasone (FLONASE) 50 MCG/ACT nasal spray, Place 2 sprays into both nostrils daily., Disp: 16 g, Rfl: 1   furosemide (LASIX) 20 MG tablet, Take 0.5 tablets (10 mg total) by mouth daily., Disp: 90 tablet, Rfl: 2   levothyroxine (SYNTHROID) 50 MCG tablet, Take 1 tablet (50 mcg total) by mouth daily., Disp: 90 tablet, Rfl: 3   lisinopril (ZESTRIL) 40 MG tablet, Take 1 tablet by mouth once daily, Disp: 90 tablet, Rfl: 2   polyethylene glycol powder (GLYCOLAX/MIRALAX) powder, Take 17 g by mouth 2 (two) times daily as needed., Disp: 3350 g, Rfl: 1   Vitamin D, Cholecalciferol, 25 MCG (1000 UT) CAPS, Take 2,000 Units by mouth daily., Disp: 90 capsule, Rfl: 3  Family History  Problem Relation Age of Onset   Diabetes Sister    Hypertension Sister    Hypertension Brother    Heart disease Brother  Past Surgical History:  Procedure Laterality Date   ABDOMINAL HYSTERECTOMY     PARATHYROIDECTOMY N/A 09/03/2017   Procedure: NECK EXPLORATION WITH LEFT INFERIOR PARATHYROIDECTOMY AND RIGHT SUPERIOR PARATHYROIDECTOMY;  Surgeon: Darnell Level, MD;  Location: [facility];  Service: General;  Laterality: N/A;   THYROID LOBECTOMY Right 09/03/2017   Procedure: RIGHT THYROID LOBECTOMY;  Surgeon: Darnell Level, MD;  Location: [facility];  Service: General;  Laterality: Right;      Review of Systems  Constitutional:  Negative for chills, fever, malaise/fatigue and weight loss.  HENT:  Negative for congestion, ear pain, hearing loss, sore throat and tinnitus.   Eyes:  Negative for blurred vision,  pain and redness.  Respiratory:  Negative for cough, hemoptysis and shortness of breath.   Cardiovascular:  Negative for chest pain, palpitations, orthopnea, claudication and leg swelling.  Gastrointestinal:  Negative for abdominal pain, blood in stool, constipation, diarrhea, nausea and vomiting.  Genitourinary:  Negative for dysuria, flank pain, frequency, hematuria and urgency.  Musculoskeletal:  Negative for falls, joint pain and myalgias.  Skin:  Negative for itching and rash.  Neurological:  Negative for dizziness, tingling, speech change, weakness and headaches.  Endo/Heme/Allergies:  Negative for polydipsia. Does not bruise/bleed easily.  Psychiatric/Behavioral:  Negative for depression and memory loss. The patient is not nervous/anxious and does not have insomnia.         Objective:  BP 120/80   Pulse 84   Ht 5\' 8"  (1.727 m)   Wt 189 lb 9.6 oz (86 kg)   BMI 28.83 kg/m   BP Readings from Last 3 Encounters:  11/16/23 120/80  07/27/23 122/80  05/18/23 122/80   Wt Readings from Last 3 Encounters:  11/16/23 189 lb 9.6 oz (86 kg)  07/27/23 187 lb (84.8 kg)  05/18/23 186 lb 6.4 oz (84.6 kg)   General appearance: alert, no distress, WD/WN, African American female Skin: unremarkable HEENT: normocephalic, conjunctiva/corneas normal, sclerae anicteric, PERRLA, EOMi, nares patent, no discharge or erythema, pharynx normal Oral cavity: MMM, tongue normal, teeth normal Neck: mild right JVP, otherwise supple, no lymphadenopathy, no thyromegaly, no masses, normal ROM, no bruits Chest: non tender, normal shape and expansion Heart: RRR, normal S1, S2, no murmurs Lungs: CTA bilaterally, no wheezes, rhonchi, or rales Abdomen: +bs, soft, non tender, non distended, no masses, no hepatomegaly, no splenomegaly, no bruits Back: non tender, normal ROM, no scoliosis Musculoskeletal: upper extremities non tender, no obvious deformity, normal ROM throughout, lower extremities non tender, no  obvious deformity, normal ROM throughout Extremities: no edema, no cyanosis, no clubbing Pulses: 2+ symmetric, upper and lower extremities, normal cap refill Neurological: alert, oriented x 3, CN2-12 intact, strength normal upper extremities and lower extremities, sensation normal throughout, DTRs 2+ throughout, no cerebellar signs, gait normal Psychiatric: normal affect, behavior normal, pleasant  Breast/gyn/rectal - deferred to gynecology   EKG reviewed    Assessment and Plan :   Encounter Diagnoses  Name Primary?   Encounter for health maintenance examination in adult Yes   Aortic atherosclerosis (HCC)    Vitamin D deficiency    Stage 3a chronic kidney disease (HCC)    Primary hypertension    Osteoporosis, unspecified osteoporosis type, unspecified pathological fracture presence    Mixed hyperlipidemia    Hypothyroidism, unspecified type    Chronic constipation    At risk for sexually transmitted disease due to partner with HIV      This visit was a preventative care visit, also known as wellness visit or routine physical.  Topics typically include healthy lifestyle, diet, exercise, preventative care, vaccinations, sick and well care, proper use of emergency dept and after hours care, as well as other concerns.    Separate significant issues discussed: Hyperlipidemia-routine labs today, continue atorvastatin 40 mg daily  Hypertension-continue lisinopril 40 mg daily, Lasix 20 mg, 1/2 tablet daily  Hypothyroidism-continue levothyroxine 50 mcg daily  Continue vitamin D supplement.  I reviewed recent labs in the chart record  Osteoporosis-I recommend aerobic and weightbearing exercise regularly.  Continue Fosamax.  Continue plan for bone density testing in May to determine if the Fosamax is working or not  We discussed heart disease screening.  She has not had a lot of testing in this regard.  She has no symptoms of concern.  Blood pressure stable.  EKG reviewed today.  Her  husband is managed for HIV disease and his viral load is nondetectable.  Labs today for routine surveillance   General Recommendations: Continue to return yearly for your annual wellness and preventative care visits.  This gives [place] a chance to discuss healthy lifestyle, exercise, vaccinations, review your chart record, and perform screenings where appropriate.  I recommend you see your eye doctor yearly for routine vision care.  I recommend you see your dentist yearly for routine dental care including hygiene visits twice yearly.   Vaccination recommendations were reviewed Immunization History  Administered Date(s) Administered   Fluad Quad(high Dose 65+) 05/03/2020, 06/26/2021, 05/12/2022   Fluad Trivalent(High Dose 65+) 05/18/2023   Influenza, High Dose Seasonal PF 10/23/2016, 05/22/2017, 06/14/2018, 05/18/2019   Influenza-Unspecified 05/18/2019   PFIZER(Purple Top)SARS-COV-2 Vaccination 10/22/2019, 11/19/2019, 07/02/2020   PNEUMOCOCCAL CONJUGATE-20 11/23/2021   Pfizer Covid-19 Vaccine Bivalent Booster 41yrs & up 07/31/2021   Pfizer(Comirnaty)Fall Seasonal Vaccine 12 years and older 07/15/2022, 05/18/2023   Pneumococcal Conjugate-13 10/23/2016   Pneumococcal Polysaccharide-23 10/29/2018   Tdap 10/30/2016   Zoster Recombinant(Shingrix) 02/18/2022, 04/29/2022    Screening for cancer: Colon cancer screening: Cologuard 09/15/23 was negative, reviewed.   Breast cancer screening: You should perform a self breast exam monthly.   We reviewed recommendations for regular mammograms and breast cancer screening. Last mammogram: 01/2023 negative   Skin cancer screening: Check your skin regularly for new changes, growing lesions, or other lesions of concern Come in for evaluation if you have skin lesions of concern.  Lung cancer screening: If you have a greater than 20 pack year history of tobacco use, then you may qualify for lung cancer screening with a chest CT scan.   Please call  your insurance company to inquire about coverage for this test.  Pancreatic cancer: no current screening test is available routinely recommended.  (Risk factors: Smoking, overweight or obese, diabetes, chronic pancreatitis, work Nurse, mental health, Solicitor, 23 year old or greater, female greater than female, African-American, family history of pancreatic cancer, hereditary breast, ovarian, melanoma, Lynch, Peutz-jeghers).  We currently don't have screenings for other cancers besides breast, cervical, colon, and lung cancers.  If you have a strong family history of cancer or have other cancer screening concerns, please let me know.    Bone health: Get at least 150 minutes of aerobic exercise weekly Get weight bearing exercise at least once weekly Bone density test:  A bone density test is an imaging test that uses a type of X-ray to measure the amount of calcium and other minerals in your bones. The test may be used to diagnose or screen you for a condition that causes weak or thin bones (osteoporosis), predict your risk for a broken bone (fracture),  or determine how well your osteoporosis treatment is working. The bone density test is recommended for females 65 and older, or females or males <65 if certain risk factors such as thyroid disease, long term use of steroids such as for asthma or rheumatological issues, vitamin D deficiency, estrogen deficiency, family history of osteoporosis, self or family history of fragility fracture in first degree relative.  Bone density 2/23 shows osteoporosis Started fosamax around early 2023.  She has bone density scheduled for 12/2023.   Heart health: Get at least 150 minutes of aerobic exercise weekly Limit alcohol It is important to maintain a healthy blood pressure and healthy cholesterol numbers  Heart disease screening: Screening for heart disease includes screening for blood pressure, fasting lipids, glucose/diabetes screening, BMI height to  weight ratio, reviewed of smoking status, physical activity, and diet.    Goals include blood pressure 120/80 or less, maintaining a healthy lipid/cholesterol profile, preventing diabetes or keeping diabetes numbers under good control, not smoking or using tobacco products, exercising most days per week or at least 150 minutes per week of exercise, and eating healthy variety of fruits and vegetables, healthy oils, and avoiding unhealthy food choices like fried food, fast food, high sugar and high cholesterol foods.    Other tests may possibly include EKG test, CT coronary calcium score, echocardiogram, exercise treadmill stress test.   EKG today reviewed, discussed other testing options.   Vascular disease screening: For high risk individuals including smokers, diabetes, patients with known heart disease or high blood pressure, kidney disease, and others, screening for vascular disease or atherosclerosis of the arteries is available.  Examples may include carotid ultrasound, abdominal aortic ultrasound, ABI blood flow screening in the legs, thoracic aorta screening.  ABI normal 12/17/22    Medical care options: I recommend you continue to seek care here first for routine care.  We try really hard to have available appointments Monday through Friday daytime hours for sick visits, acute visits, and physicals.  Urgent care should be used for after hours and weekends for significant issues that cannot wait till the next day.  The emergency department should be used for significant potentially life-threatening emergencies.  The emergency department is expensive, can often have long wait times for less significant concerns, so try to utilize primary care, urgent care, or telemedicine when possible to avoid unnecessary trips to the emergency department.  Virtual visits and telemedicine have been introduced since the pandemic started in 2020, and can be convenient ways to receive medical care.  We offer  virtual appointments as well to assist you in a variety of options to seek medical care.   Legal Take the time to do a Last Will and Testament, advanced directives including Healthcare Power of Attorney and Living Will documents.  Do not leave your family with burdens that can be handled ahead of time.   Advanced Directives: On file    Spiritual and Emotional Health Keeping a healthy spiritual life can help you better manage your physical health. Your spiritual life can help you to cope with any issues that may arise with your physical health.  Balance can keep [place] healthy and help [place] to recover.  If you are struggling with your spiritual health there are questions that you may want to ask yourself:  What makes me feel most complete? When do I feel most connected to the rest of the world? Where do I find the most inner strength? What am I doing when I feel whole?  Helpful tips: Being in nature. Some people feel very connected and at peace when they are walking outdoors or are outside. Helping others. Some feel the largest sense of wellbeing when they are of service to others. Being of service can take on many forms. It can be doing volunteer work, being kind to strangers, or offering a hand to a friend in need. Gratitude. Some people find they feel the most connected when they remain grateful. They may make lists of all the things they are grateful for or say a thank you out loud for all they have.    Emotional Health Are you in tune with your emotional health?  Check out this link: [email or web address]   Financial Health Make sure you use a budget for your personal finances Make sure you are insured against risks (health insurance, life insurance, auto insurance, etc) Save more, spend less Set financial goals If you need help in this area, good resources include counseling through [employer] or other community resources, have a meeting with a Designer, multimedia, and a good resource is [employer] was seen today for annual exam.  Diagnoses and all orders for this visit:  Encounter for health maintenance examination in adult -     CBC -     Lipid panel -     TSH + free T4 -     Renal Function Panel -     Hepatic function panel -     HIV Antibody (routine testing w rflx) -     Hepatitis B surface antigen -     Hepatitis C antibody -     RPR -     POCT Urinalysis DIP (Proadvantage Device) -     EKG 12-Lead  Aortic atherosclerosis (HCC)  Vitamin D deficiency  Stage 3a chronic kidney disease (HCC) -     Renal Function Panel -     Hepatic function panel  Primary hypertension -     Renal Function Panel -     Hepatic function panel -     POCT Urinalysis DIP (Proadvantage Device) -     EKG 12-Lead  Osteoporosis, unspecified osteoporosis type, unspecified pathological fracture presence  Mixed hyperlipidemia -     Lipid panel -     Hepatic function panel  Hypothyroidism, unspecified type -     TSH + free T4  Chronic constipation  At risk for sexually transmitted disease due to partner with HIV -     HIV Antibody (routine testing w rflx) -     Hepatitis B surface antigen -     Hepatitis C antibody -     RPR     Follow-up pending labs, yearly for physical

## 2023-11-16 NOTE — Patient Instructions (Signed)
 This visit was a preventative care visit, also known as wellness visit or routine physical.   Topics typically include healthy lifestyle, diet, exercise, preventative care, vaccinations, sick and well care, proper use of emergency dept and after hours care, as well as other concerns.    Separate significant issues discussed: Hyperlipidemia-Avva labs today, continue atorvastatin 40 mg daily  Hypertension-continue lisinopril 40 mg daily, Lasix 20 mg, 1/2 tablet daily  Hypothyroidism-continue levothyroxine 50 mcg daily  Continue vitamin D supplement.  I reviewed recent labs in the chart record  Osteoporosis-I recommend aerobic and weightbearing exercise regularly.  Continue Fosamax.  Continue plan for bone density testing in May to determine if the Fosamax is working or not  We discussed heart disease screening.  She has not had a lot of testing in this regard.  She has no symptoms of concern.  Blood pressure stable.  EKG reviewed today.  Her husband is managed for HIV disease and his viral load is nondetectable.  Labs today for routine surveillance   General Recommendations: Continue to return yearly for your annual wellness and preventative care visits.  This gives [place] a chance to discuss healthy lifestyle, exercise, vaccinations, review your chart record, and perform screenings where appropriate.  I recommend you see your eye doctor yearly for routine vision care.  I recommend you see your dentist yearly for routine dental care including hygiene visits twice yearly.   Vaccination recommendations were reviewed Immunization History  Administered Date(s) Administered   Fluad Quad(high Dose 65+) 05/03/2020, 06/26/2021, 05/12/2022   Fluad Trivalent(High Dose 65+) 05/18/2023   Influenza, High Dose Seasonal PF 10/23/2016, 05/22/2017, 06/14/2018, 05/18/2019   Influenza-Unspecified 05/18/2019   PFIZER(Purple Top)SARS-COV-2 Vaccination 10/22/2019, 11/19/2019, 07/02/2020   PNEUMOCOCCAL  CONJUGATE-20 11/23/2021   Pfizer Covid-19 Vaccine Bivalent Booster 60yrs & up 07/31/2021   Pfizer(Comirnaty)Fall Seasonal Vaccine 12 years and older 07/15/2022, 05/18/2023   Pneumococcal Conjugate-13 10/23/2016   Pneumococcal Polysaccharide-23 10/29/2018   Tdap 10/30/2016   Zoster Recombinant(Shingrix) 02/18/2022, 04/29/2022    Screening for cancer: Colon cancer screening: Cologuard 09/15/23 was negative, reviewed.   Breast cancer screening: You should perform a self breast exam monthly.   We reviewed recommendations for regular mammograms and breast cancer screening. Last mammogram: 01/2023 negative   Skin cancer screening: Check your skin regularly for new changes, growing lesions, or other lesions of concern Come in for evaluation if you have skin lesions of concern.  Lung cancer screening: If you have a greater than 20 pack year history of tobacco use, then you may qualify for lung cancer screening with a chest CT scan.   Please call your insurance company to inquire about coverage for this test.  Pancreatic cancer: no current screening test is available routinely recommended.  (Risk factors: Smoking, overweight or obese, diabetes, chronic pancreatitis, work Nurse, mental health, Solicitor, 29 year old or greater, female greater than female, African-American, family history of pancreatic cancer, hereditary breast, ovarian, melanoma, Lynch, Peutz-jeghers).  We currently don't have screenings for other cancers besides breast, cervical, colon, and lung cancers.  If you have a strong family history of cancer or have other cancer screening concerns, please let me know.    Bone health: Get at least 150 minutes of aerobic exercise weekly Get weight bearing exercise at least once weekly Bone density test:  A bone density test is an imaging test that uses a type of X-ray to measure the amount of calcium and other minerals in your bones. The test may be used to diagnose or screen you  for  a condition that causes weak or thin bones (osteoporosis), predict your risk for a broken bone (fracture), or determine how well your osteoporosis treatment is working. The bone density test is recommended for females 65 and older, or females or males <65 if certain risk factors such as thyroid disease, long term use of steroids such as for asthma or rheumatological issues, vitamin D deficiency, estrogen deficiency, family history of osteoporosis, self or family history of fragility fracture in first degree relative.  Bone density 2/23 shows osteoporosis Started fosamax around early 2023.  She has bone density scheduled for 12/2023.   Heart health: Get at least 150 minutes of aerobic exercise weekly Limit alcohol It is important to maintain a healthy blood pressure and healthy cholesterol numbers  Heart disease screening: Screening for heart disease includes screening for blood pressure, fasting lipids, glucose/diabetes screening, BMI height to weight ratio, reviewed of smoking status, physical activity, and diet.    Goals include blood pressure 120/80 or less, maintaining a healthy lipid/cholesterol profile, preventing diabetes or keeping diabetes numbers under good control, not smoking or using tobacco products, exercising most days per week or at least 150 minutes per week of exercise, and eating healthy variety of fruits and vegetables, healthy oils, and avoiding unhealthy food choices like fried food, fast food, high sugar and high cholesterol foods.    Other tests may possibly include EKG test, CT coronary calcium score, echocardiogram, exercise treadmill stress test.   EKG today reviewed, discussed other testing options.   Vascular disease screening: For high risk individuals including smokers, diabetes, patients with known heart disease or high blood pressure, kidney disease, and others, screening for vascular disease or atherosclerosis of the arteries is available.  Examples may  include carotid ultrasound, abdominal aortic ultrasound, ABI blood flow screening in the legs, thoracic aorta screening.  ABI normal 12/17/22    Medical care options: I recommend you continue to seek care here first for routine care.  We try really hard to have available appointments Monday through Friday daytime hours for sick visits, acute visits, and physicals.  Urgent care should be used for after hours and weekends for significant issues that cannot wait till the next day.  The emergency department should be used for significant potentially life-threatening emergencies.  The emergency department is expensive, can often have long wait times for less significant concerns, so try to utilize primary care, urgent care, or telemedicine when possible to avoid unnecessary trips to the emergency department.  Virtual visits and telemedicine have been introduced since the pandemic started in 2020, and can be convenient ways to receive medical care.  We offer virtual appointments as well to assist you in a variety of options to seek medical care.   Legal Take the time to do a Last Will and Testament, advanced directives including Healthcare Power of Attorney and Living Will documents.  Do not leave your family with burdens that can be handled ahead of time.   Advanced Directives: On file    Spiritual and Emotional Health Keeping a healthy spiritual life can help you better manage your physical health. Your spiritual life can help you to cope with any issues that may arise with your physical health.  Balance can keep [place] healthy and help [place] to recover.  If you are struggling with your spiritual health there are questions that you may want to ask yourself:  What makes me feel most complete? When do I feel most connected to the rest of the world? Where  do I find the most inner strength? What am I doing when I feel whole?  Helpful tips: Being in nature. Some people feel very connected and at peace  when they are walking outdoors or are outside. Helping others. Some feel the largest sense of wellbeing when they are of service to others. Being of service can take on many forms. It can be doing volunteer work, being kind to strangers, or offering a hand to a friend in need. Gratitude. Some people find they feel the most connected when they remain grateful. They may make lists of all the things they are grateful for or say a thank you out loud for all they have.    Emotional Health Are you in tune with your emotional health?  Check out this link: http://www.marquez-love.com/   Financial Health Make sure you use a budget for your personal finances Make sure you are insured against risks (health insurance, life insurance, auto insurance, etc) Save more, spend less Set financial goals If you need help in this area, good resources include counseling through [employer] or other community resources, have a meeting with a Social research officer, government, and a good resource is [employer]

## 2023-11-17 ENCOUNTER — Ambulatory Visit: Payer: Medicare HMO

## 2023-11-17 DIAGNOSIS — Z0000 Encounter for general adult medical examination without abnormal findings: Secondary | ICD-10-CM | POA: Diagnosis not present

## 2023-11-17 LAB — CBC
Hematocrit: 42.2 % (ref 34.0–46.6)
Hemoglobin: 13.5 g/dL (ref 11.1–15.9)
MCH: 27.1 pg (ref 26.6–33.0)
MCHC: 32 g/dL (ref 31.5–35.7)
MCV: 85 fL (ref 79–97)
Platelets: 190 10*3/uL (ref 150–450)
RBC: 4.99 x10E6/uL (ref 3.77–5.28)
RDW: 13.3 % (ref 11.7–15.4)
WBC: 5 10*3/uL (ref 3.4–10.8)

## 2023-11-17 LAB — RENAL FUNCTION PANEL
Albumin: 4.3 g/dL (ref 3.8–4.8)
BUN/Creatinine Ratio: 13 (ref 12–28)
BUN: 15 mg/dL (ref 8–27)
CO2: 24 mmol/L (ref 20–29)
Calcium: 9.4 mg/dL (ref 8.7–10.3)
Chloride: 107 mmol/L — ABNORMAL HIGH (ref 96–106)
Creatinine, Ser: 1.19 mg/dL — ABNORMAL HIGH (ref 0.57–1.00)
Glucose: 91 mg/dL (ref 70–99)
Phosphorus: 3.2 mg/dL (ref 3.0–4.3)
Potassium: 4 mmol/L (ref 3.5–5.2)
Sodium: 146 mmol/L — ABNORMAL HIGH (ref 134–144)
eGFR: 47 mL/min/{1.73_m2} — ABNORMAL LOW (ref >59)

## 2023-11-17 LAB — HIV ANTIBODY (ROUTINE TESTING W REFLEX): HIV Screen 4th Generation wRfx: NONREACTIVE

## 2023-11-17 LAB — HEPATIC FUNCTION PANEL
ALT: 11 IU/L (ref 0–32)
AST: 18 IU/L (ref 0–40)
Alkaline Phosphatase: 63 IU/L (ref 44–121)
Bilirubin Total: 0.9 mg/dL (ref 0.0–1.2)
Bilirubin, Direct: 0.26 mg/dL (ref 0.00–0.40)
Total Protein: 6.9 g/dL (ref 6.0–8.5)

## 2023-11-17 LAB — LIPID PANEL
Chol/HDL Ratio: 3.2 ratio (ref 0.0–4.4)
Cholesterol, Total: 160 mg/dL (ref 100–199)
HDL: 50 mg/dL (ref >39)
LDL Chol Calc (NIH): 95 mg/dL (ref 0–99)
Triglycerides: 77 mg/dL (ref 0–149)
VLDL Cholesterol Cal: 15 mg/dL (ref 5–40)

## 2023-11-17 LAB — TSH+FREE T4
Free T4: 1.42 ng/dL (ref 0.82–1.77)
TSH: 1.72 u[IU]/mL (ref 0.450–4.500)

## 2023-11-17 LAB — HEPATITIS B SURFACE ANTIGEN: Hepatitis B Surface Ag: NEGATIVE

## 2023-11-17 LAB — RPR: RPR Ser Ql: NONREACTIVE

## 2023-11-17 LAB — HEPATITIS C ANTIBODY: Hep C Virus Ab: NONREACTIVE

## 2023-11-17 NOTE — Progress Notes (Signed)
 Subjective:   Tracy Rowe is a 78 y.o. who presents for a Medicare Wellness preventive visit.  Visit Complete: Virtual I connected with  Odette Fraction on 11/17/23 by a audio enabled telemedicine application and verified that I am speaking with the correct person using two identifiers.  Patient Location: Home  Provider Location: Office/Clinic  I discussed the limitations of evaluation and management by telemedicine. The patient expressed understanding and agreed to proceed.  Vital Signs: Because this visit was a virtual/telehealth visit, some criteria may be missing or patient reported. Any vitals not documented were not able to be obtained and vitals that have been documented are patient reported.  VideoError- Librarian, academic were attempted between this provider and patient, however failed, due to patient having technical difficulties OR patient did not have access to video capability.  We continued and completed visit with audio only.   Persons Participating in Visit: Patient.  AWV Questionnaire: No: Patient Medicare AWV questionnaire was not completed prior to this visit.  Cardiac Risk Factors include: advanced age (>39men, >39 women);dyslipidemia;hypertension     Objective:    Today's Vitals   There is no height or weight on file to calculate BMI.     11/17/2023    9:30 AM 11/11/2022   10:14 AM 11/07/2021   10:45 AM 10/31/2020    9:55 AM 09/03/2017    3:30 PM 09/03/2017    9:25 AM 08/24/2017   10:19 AM  Advanced Directives  Does Patient Have a Medical Advance Directive? Yes Yes Yes Yes Yes Yes Yes  Type of Advance Directive Out of facility DNR (pink MOST or yellow form) Out of facility DNR (pink MOST or yellow form)  Out of facility DNR (pink MOST or yellow form) Healthcare Power of [place];Living will Healthcare Power of [place];Living will Healthcare Power of [place];Living will  Does patient want to make changes to medical advance  directive?   No - Patient declined No - Patient declined No - Patient declined No - Patient declined No - Patient declined  Copy of Healthcare Power of Attorney in Chart?     No - copy requested No - copy requested No - copy requested    Current Medications (verified) Outpatient Encounter Medications as of 11/17/2023  Medication Sig   acetaminophen (TYLENOL) 500 MG tablet Take 1,000 mg by mouth every 6 (six) hours as needed.   alendronate (FOSAMAX) 70 MG tablet Take 1 tablet (70 mg total) by mouth once a week. Take with a full glass of water on an empty stomach.   atorvastatin (LIPITOR) 40 MG tablet Take 1 tablet (40 mg total) by mouth daily.   fluticasone (FLONASE) 50 MCG/ACT nasal spray Place 2 sprays into both nostrils daily.   furosemide (LASIX) 20 MG tablet Take 0.5 tablets (10 mg total) by mouth daily.   levothyroxine (SYNTHROID) 50 MCG tablet Take 1 tablet (50 mcg total) by mouth daily.   lisinopril (ZESTRIL) 40 MG tablet Take 1 tablet by mouth once daily   polyethylene glycol powder (GLYCOLAX/MIRALAX) powder Take 17 g by mouth 2 (two) times daily as needed.   Vitamin D, Cholecalciferol, 25 MCG (1000 UT) CAPS Take 2,000 Units by mouth daily.   No facility-administered encounter medications on file as of 11/17/2023.    Allergies (verified) Patient has no known allergies.   History: Past Medical History:  Diagnosis Date   Aortic atherosclerosis (HCC) 06/28/2019   HTN (hypertension)    Hyperlipidemia    Hypertension 09/25/2016  Osteoporosis    Past Surgical History:  Procedure Laterality Date   ABDOMINAL HYSTERECTOMY     PARATHYROIDECTOMY N/A 09/03/2017   Procedure: NECK EXPLORATION WITH LEFT INFERIOR PARATHYROIDECTOMY AND RIGHT SUPERIOR PARATHYROIDECTOMY;  Surgeon: Darnell Level, MD;  Location: [facility];  Service: General;  Laterality: N/A;   THYROID LOBECTOMY Right 09/03/2017   Procedure: RIGHT THYROID LOBECTOMY;  Surgeon: Darnell Level, MD;  Location: [facility];  Service: General;   Laterality: Right;   Family History  Problem Relation Age of Onset   Diabetes Sister    Hypertension Sister    Hypertension Brother    Heart disease Brother    Social History   Socioeconomic History   Marital status: Married    Spouse name: Not on file   Number of children: Not on file   Years of education: Not on file   Highest education level: Not on file  Occupational History   Not on file  Tobacco Use   Smoking status: Never   Smokeless tobacco: Never  Vaping Use   Vaping status: Never Used  Substance and Sexual Activity   Alcohol use: No   Drug use: No   Sexual activity: Never  Other Topics Concern   Not on file  Social History Narrative   Not on file   Social Drivers of Health   Financial Resource Strain: Low Risk  (11/17/2023)   Overall Financial Resource Strain (CARDIA)    Difficulty of Paying Living Expenses: Not hard at all  Food Insecurity: No Food Insecurity (11/17/2023)   Hunger Vital Sign    Worried About Running Out of Food in the Last Year: Never true    Ran Out of Food in the Last Year: Never true  Transportation Needs: No Transportation Needs (11/17/2023)   PRAPARE - Administrator, Civil Service (Medical): No    Lack of Transportation (Non-Medical): No  Physical Activity: Inactive (11/17/2023)   Exercise Vital Sign    Days of Exercise per Week: 0 days    Minutes of Exercise per Session: 0 min  Stress: No Stress Concern Present (11/17/2023)   [employer] of Occupational Health - Occupational Stress Questionnaire    Feeling of Stress : Not at all  Social Connections: Moderately Integrated (11/17/2023)   Social Connection and Isolation Panel [NHANES]    Frequency of Communication with Friends and Family: Twice a week    Frequency of Social Gatherings with Friends and Family: Once a week    Attends Religious Services: More than 4 times per year    Active Member of [employer] or Organizations: No    Attends Engineer, structural:  Never    Marital Status: Married    Tobacco Counseling Counseling given: Not Answered    Clinical Intake:  Pre-visit preparation completed: Yes  Pain : No/denies pain     Nutritional Risks: None Diabetes: No  No results found for: "HGBA1C"   How often do you need to have someone help you when you read instructions, pamphlets, or other written materials from your doctor or pharmacy?: 1 - Never  Interpreter Needed?: No  Information entered by :: NAllen LPN   Activities of Daily Living     11/17/2023    9:26 AM  In your present state of health, do you have any difficulty performing the following activities:  Hearing? 1  Comment no hearing aids  Vision? 0  Difficulty concentrating or making decisions? 0  Walking or climbing stairs? 0  Dressing or bathing?  0  Doing errands, shopping? 0  Preparing Food and eating ? N  Using the Toilet? N  In the past six months, have you accidently leaked urine? N  Do you have problems with loss of bowel control? N  Managing your Medications? N  Managing your Finances? N  Housekeeping or managing your Housekeeping? N    Patient Care Team: Tysinger, Kermit Balo, PA-C as PCP - General (Family Medicine) Carlus Pavlov, MD as Consulting Physician (Internal Medicine)  Indicate any recent Medical Services you may have received from other than [facility] providers in the past year (date may be approximate).     Assessment:   This is a routine wellness examination for [facility].  Hearing/Vision screen Hearing Screening - Comments:: Trouble hearing, but no hearing aids Vision Screening - Comments:: No regular eye exams   Goals Addressed             This Visit's Progress    Patient Stated       11/17/2023, wants to exercise more       Depression Screen     11/17/2023    9:31 AM 11/12/2022   10:19 AM 11/11/2022   10:15 AM 05/12/2022    9:45 AM 11/07/2021   10:31 AM 01/02/2021    9:31 AM 10/31/2020    9:56 AM  PHQ 2/9 Scores  PHQ - 2  Score 0 0 0 0 0 0 0  PHQ- 9 Score 0  0        Fall Risk     11/17/2023    9:30 AM 11/12/2022   10:19 AM 11/11/2022   10:15 AM 10/06/2022    1:50 PM 05/12/2022    9:45 AM  Fall Risk   Falls in the past year? 0 0 0 0 0  Number falls in past yr: 0 0 0 0 0  Injury with Fall? 0 0 0 0 0  Risk for fall due to : Medication side effect No Fall Risks Medication side effect No Fall Risks No Fall Risks  Follow up Falls prevention discussed;Falls evaluation completed Falls evaluation completed Falls prevention discussed;Education provided;Falls evaluation completed Falls evaluation completed Falls evaluation completed    MEDICARE RISK AT HOME:  Medicare Risk at Home Any stairs in or around the home?: No If so, are there any without handrails?: No Home free of loose throw rugs in walkways, pet beds, electrical cords, etc?: Yes Adequate lighting in your home to reduce risk of falls?: Yes Life alert?: No Use of a cane, [place] or w/c?: No Grab bars in the bathroom?: Yes Shower chair or bench in shower?: Yes Elevated toilet seat or a handicapped toilet?: No  TIMED UP AND GO:  Was the test performed?  No  Cognitive Function: 6CIT completed        11/17/2023    9:32 AM 11/11/2022   10:16 AM  6CIT Screen  What Year? 0 points 0 points  What month? 0 points 0 points  What time? 0 points 0 points  Count back from 20 0 points 0 points  Months in reverse 0 points 2 points  Repeat phrase 0 points 0 points  Total Score 0 points 2 points    Immunizations Immunization History  Administered Date(s) Administered   Fluad Quad(high Dose 65+) 05/03/2020, 06/26/2021, 05/12/2022   Fluad Trivalent(High Dose 65+) 05/18/2023   Influenza, High Dose Seasonal PF 10/23/2016, 05/22/2017, 06/14/2018, 05/18/2019   Influenza-Unspecified 05/18/2019   PFIZER(Purple Top)SARS-COV-2 Vaccination 10/22/2019, 11/19/2019, 07/02/2020   PNEUMOCOCCAL CONJUGATE-20  11/23/2021   Pfizer Covid-19 Firefighter Booster 65yrs  & up 07/31/2021   Pfizer(Comirnaty)Fall Seasonal Vaccine 12 years and older 07/15/2022, 05/18/2023   Pneumococcal Conjugate-13 10/23/2016   Pneumococcal Polysaccharide-23 10/29/2018   Tdap 10/30/2016   Zoster Recombinant(Shingrix) 02/18/2022, 04/29/2022    Screening Tests Health Maintenance  Topic Date Due   COVID-19 Vaccine (7 - 2024-25 season) 12/02/2023 (Originally 11/15/2023)   Medicare Annual Wellness (AWV)  11/16/2024   DTaP/Tdap/Td (2 - Td or Tdap) 10/31/2026   Pneumonia Vaccine 47+ Years old  Completed   INFLUENZA VACCINE  Completed   DEXA SCAN  Completed   Hepatitis C Screening  Completed   Zoster Vaccines- Shingrix  Completed   HPV VACCINES  Aged Out   Fecal DNA (Cologuard)  Discontinued    Health Maintenance  There are no preventive care reminders to display for this patient.  Health Maintenance Items Addressed: Up to date  Additional Screening:  Vision Screening: Recommended annual ophthalmology exams for early detection of glaucoma and other disorders of the eye.  Dental Screening: Recommended annual dental exams for proper oral hygiene  Community Resource Referral / Chronic Care Management: CRR required this visit?  No   CCM required this visit?  No     Plan:     I have personally reviewed and noted the following in the patient's chart:   Medical and social history Use of alcohol, tobacco or illicit drugs  Current medications and supplements including opioid prescriptions. Patient is not currently taking opioid prescriptions. Functional ability and status Nutritional status Physical activity Advanced directives List of other physicians Hospitalizations, surgeries, and ER visits in previous 12 months Vitals Screenings to include cognitive, depression, and falls Referrals and appointments  In addition, I have reviewed and discussed with patient certain preventive protocols, quality metrics, and best practice recommendations. A written  personalized care plan for preventive services as well as general preventive health recommendations were provided to patient.     Barb Merino, LPN   1/61/0960   After Visit Summary: (Pick Up) Due to this being a telephonic visit, with patients personalized plan was offered to patient and patient has requested to Pick up at office.  Notes: Nothing significant to report at this time.

## 2023-11-17 NOTE — Patient Instructions (Signed)
 [name] , Thank you for taking time to come for your Medicare Wellness Visit. I appreciate your ongoing commitment to your health goals. Please review the following plan we discussed and let me know if I can assist you in the future.   Referrals/Orders/Follow-Ups/Clinician Recommendations: none  This is a list of the screening recommended for you and due dates:  Health Maintenance  Topic Date Due   COVID-19 Vaccine (7 - 2024-25 season) 12/02/2023*   Medicare Annual Wellness Visit  11/16/2024   DTaP/Tdap/Td vaccine (2 - Td or Tdap) 10/31/2026   Pneumonia Vaccine  Completed   Flu Shot  Completed   DEXA scan (bone density measurement)  Completed   Hepatitis C Screening  Completed   Zoster (Shingles) Vaccine  Completed   HPV Vaccine  Aged Out   Cologuard (Stool DNA test)  Discontinued  *Topic was postponed. The date shown is not the original due date.    Advanced directives: (In Chart) A copy of your advanced directives are scanned into your chart should your provider ever need it.  Next Medicare Annual Wellness Visit scheduled for next year: Yes  insert Preventive Care attachment Insert FALL PREVENTION attachment if needed

## 2023-11-17 NOTE — Progress Notes (Signed)
 Labs show chronic kidney disease but stable, blood counts okay, cholesterol looks good, thyroid okay, liver test okay.  Negative for syphilis, hepatitis B&C, negative for HIV.  Continue current medications

## 2024-01-20 ENCOUNTER — Ambulatory Visit: Payer: Self-pay | Admitting: Medical

## 2024-01-20 ENCOUNTER — Ambulatory Visit
Admission: RE | Admit: 2024-01-20 | Discharge: 2024-01-20 | Disposition: A | Discharge status: Home / Self Care | Payer: Medicare HMO | Source: Ambulatory Visit | Attending: Medical | Admitting: Medical

## 2024-01-20 DIAGNOSIS — M810 Age-related osteoporosis without current pathological fracture: Secondary | ICD-10-CM

## 2024-01-20 DIAGNOSIS — M8588 Other specified disorders of bone density and structure, other site: Secondary | ICD-10-CM | POA: Diagnosis not present

## 2024-01-20 DIAGNOSIS — N958 Other specified menopausal and perimenopausal disorders: Secondary | ICD-10-CM | POA: Diagnosis not present

## 2024-01-20 NOTE — Progress Notes (Signed)
 Bone density test shows osteoporosis.  Based on comparison, not a lot of improvement tor change since last scan.  If agreeable, I recommend referral to sports medicine clinic to discuss other osteoporosis treatment options. (Dr. Jimmye Moulds [place]'s office)

## 2024-01-21 ENCOUNTER — Other Ambulatory Visit: Payer: Self-pay | Admitting: Internal Medicine

## 2024-01-21 DIAGNOSIS — M810 Age-related osteoporosis without current pathological fracture: Secondary | ICD-10-CM

## 2024-01-25 ENCOUNTER — Ambulatory Visit: Admitting: Family Medicine

## 2024-01-25 ENCOUNTER — Telehealth: Payer: Self-pay | Admitting: *Deleted

## 2024-01-25 ENCOUNTER — Encounter: Payer: Self-pay | Admitting: Family Medicine

## 2024-01-25 VITALS — BP 178/73 | Ht 67.0 in | Wt 189.0 lb

## 2024-01-25 DIAGNOSIS — M810 Age-related osteoporosis without current pathological fracture: Secondary | ICD-10-CM

## 2024-01-25 NOTE — Progress Notes (Signed)
 PCP: Claudene Crystal, PA-C  Subjective:   HPI: Patient is a 78 y.o. female here for osteoporosis.  Patient has history of osteoporosis and has been on alendronate  since 09/2021.  She has been tolerating this medication well. On recent Dexa her bone density was largely unchanged so referred to us  to discuss options. Prior treatment: alendronate  History of Hip, Spine, or Wrist Fracture: no Heart disease or stroke: no Cancer: no Kidney Disease: no Gastric/Peptic Ulcer: no Gastric bypass surgery: no Severe GERD: no History of seizures: no Age at Menopause: unsure Calcium  intake: none Vitamin D  intake: 2000 international units daily Hormone replacement therapy: no Smoking history: never Alcohol: none Exercise: walking Major dental work in past year: no Parents with hip/spine fracture: not sure  Past Medical History:  Diagnosis Date   Aortic atherosclerosis (HCC) 06/28/2019   HTN (hypertension)    Hyperlipidemia    Hypertension 09/25/2016   Osteoporosis     Current Outpatient Medications on File Prior to Visit  Medication Sig Dispense Refill   acetaminophen  (TYLENOL ) 500 MG tablet Take 1,000 mg by mouth every 6 (six) hours as needed.     alendronate  (FOSAMAX ) 70 MG tablet Take 1 tablet (70 mg total) by mouth once a week. Take with a full glass of water on an empty stomach. 12 tablet 3   atorvastatin  (LIPITOR) 40 MG tablet Take 1 tablet (40 mg total) by mouth daily. 90 tablet 3   fluticasone  (FLONASE ) 50 MCG/ACT nasal spray Place 2 sprays into both nostrils daily. 16 g 1   furosemide  (LASIX ) 20 MG tablet Take 0.5 tablets (10 mg total) by mouth daily. 90 tablet 2   levothyroxine  (SYNTHROID ) 50 MCG tablet Take 1 tablet (50 mcg total) by mouth daily. 90 tablet 3   lisinopril  (ZESTRIL ) 40 MG tablet Take 1 tablet by mouth once daily 90 tablet 2   polyethylene glycol powder (GLYCOLAX /MIRALAX ) powder Take 17 g by mouth 2 (two) times daily as needed. 3350 g 1   Vitamin D ,  Cholecalciferol , 25 MCG (1000 UT) CAPS Take 2,000 Units by mouth daily. 90 capsule 3   No current facility-administered medications on file prior to visit.    Past Surgical History:  Procedure Laterality Date   ABDOMINAL HYSTERECTOMY     PARATHYROIDECTOMY N/A 09/03/2017   Procedure: NECK EXPLORATION WITH LEFT INFERIOR PARATHYROIDECTOMY AND RIGHT SUPERIOR PARATHYROIDECTOMY;  Surgeon: Oralee Billow, MD;  Location: [facility];  Service: General;  Laterality: N/A;   THYROID  LOBECTOMY Right 09/03/2017   Procedure: RIGHT THYROID  LOBECTOMY;  Surgeon: Oralee Billow, MD;  Location: [facility];  Service: General;  Laterality: Right;    No Known Allergies  BP (!) 178/73 (BP Location: Left Arm, Patient Position: Sitting, Cuff Size: Normal)   Ht 5\' 7"  (1.702 m)   Wt 189 lb (85.7 kg)   BMI 29.60 kg/m       No data to display              No data to display              Objective:  Physical Exam:  Gen: NAD, comfortable in exam room  Dexa 01/20/24 T scores:  L spine -2.0 (down from -1.7) L total hip -2.5 (from -2.6) R total hip -2.3  11/16/23 labs: CBC normal TSH 1.72 Renal function panel: GFR 4.7 with Cr 1.19.  Ca 9.4   Assessment & Plan:  1. Osteoporosis without history of fracture - no risk factors/findings for severe osteoporosis.  Unlikely she would  qualify due to this for Evenity or Tymlos. No statistical change with alendronate  on her bone density so advised we look into Prolia instead.  Discussed pros/cons.  Will check her Vitamin D  level.  Encouraged calcium  and vitamin D  intake, regular exercise.  She is going to think about prolia will we investigate cost and let us  know if she would like to proceed.  Total visit time 30 minutes including documentation.

## 2024-01-25 NOTE — Patient Instructions (Signed)
 You have osteoporosis. Get your vitamin D  level checked. We will look into Prolia for you while you think about this. This would be an injectable you get here in the office every 6 months but you'd have to continue this indefinitely (or switch to a different medication at some point). Take calcium  1200mg  daily Vitamin D  800 international units daily. Continue walking for exercise. In the meantime continue your weekly alendronate .

## 2024-01-25 NOTE — Telephone Encounter (Signed)
 Medical [facility] and Raenette Bumps  Patient is ready for scheduling on or after: 01/29/24  Out-of-pocket cost due at time of visit: $373.71  Primary: [employer] Prolia co-insurance: 20% (approximately $333.71) Admin fee co-insurance: 20% (approximately $40)  Deductible: does not apply  Prior Auth: Approved Auth #: [id number] Valid: 01/29/24 to 08/24/24    ** This summary of benefits is an estimation of the patient's out-of-pocket cost. Exact cost may vary based on individual plan coverage.

## 2024-01-26 ENCOUNTER — Ambulatory Visit: Payer: Self-pay | Admitting: Family Medicine

## 2024-01-26 LAB — VITAMIN D 25 HYDROXY (VIT D DEFICIENCY, FRACTURES): Vit D, 25-Hydroxy: 50.5 ng/mL (ref 30.0–100.0)

## 2024-02-01 ENCOUNTER — Other Ambulatory Visit: Payer: Self-pay | Admitting: Pharmacy Technician

## 2024-02-01 ENCOUNTER — Other Ambulatory Visit: Payer: Self-pay | Admitting: *Deleted

## 2024-02-01 ENCOUNTER — Other Ambulatory Visit: Payer: Self-pay

## 2024-02-01 MED ORDER — DENOSUMAB 60 MG/ML ~~LOC~~ SOSY
60.0000 mg | PREFILLED_SYRINGE | Freq: Once | SUBCUTANEOUS | 0 refills | Status: AC
  Filled 2024-02-01: qty 1, 1d supply, fill #0

## 2024-02-02 ENCOUNTER — Other Ambulatory Visit: Payer: Self-pay

## 2024-02-02 NOTE — Progress Notes (Signed)
 Pharmacy Patient Advocate Encounter  Insurance verification completed.   The patient is insured through [employer]   Ran test claim for Prolia. Co-pay is $64.  This test claim was processed through [facility]- copay amounts may vary at other pharmacies due to pharmacy/plan contracts, or as the patient moves through the different stages of their insurance plan.

## 2024-02-03 ENCOUNTER — Other Ambulatory Visit: Payer: Self-pay

## 2024-02-04 ENCOUNTER — Telehealth: Payer: Self-pay | Admitting: *Deleted

## 2024-02-04 NOTE — Telephone Encounter (Signed)
 Talked to patient about prolia and the benefits ran by our pharmacist Jyl Or).  Informed pt prolia would only be $64 if she got it from the pharmacy compared to $373.71 if we did buy & bill.  Pt states she would rather continue with the fosamax  she is currently taking. Told her to message us  if she needs refills on the fosamax  in the future.

## 2024-02-04 NOTE — Telephone Encounter (Signed)
 See 02/04/24 tele encounter. Patient archived in [employer] portal.

## 2024-02-05 ENCOUNTER — Telehealth: Payer: Self-pay | Admitting: Internal Medicine

## 2024-02-05 ENCOUNTER — Other Ambulatory Visit: Payer: Self-pay

## 2024-02-05 ENCOUNTER — Telehealth: Payer: Self-pay

## 2024-02-05 DIAGNOSIS — E782 Mixed hyperlipidemia: Secondary | ICD-10-CM

## 2024-02-05 MED ORDER — ATORVASTATIN CALCIUM 40 MG PO TABS: 40.0000 mg | ORAL_TABLET | Freq: Every day | ORAL | 1 refills | Status: DC

## 2024-02-05 NOTE — Telephone Encounter (Signed)
-----   Message from Carnell Christian sent at 02/05/2024 10:50 AM EDT ----- Hello,  Patient is requesting refill on the following medication:  Atorvastatin  40mg  1 tablet by mouth daily  Pharmacy Info: [id number] - [place] ([place]), [place] - [address] P: [phone] F: [phone]  Thank you, Carnell Christian, PharmD Clinical Pharmacist [phone]

## 2024-02-05 NOTE — Progress Notes (Signed)
   02/05/2024  Patient ID: Tracy Rowe, female   DOB: 09/23/1945, 78 y.o.   MRN: [id number]  Pharmacy Quality Measure Review  This patient is appearing on a report for being at risk of failing the adherence measure for cholesterol (statin) medications this calendar year.   Medication: Atorvastatin  Last fill date: 09/11/23 for 90 day supply  Will collaborate with provider to facilitate refill needs.  Carnell Christian, PharmD Clinical Pharmacist [phone]

## 2024-02-08 ENCOUNTER — Other Ambulatory Visit: Payer: Self-pay

## 2024-02-08 NOTE — Progress Notes (Signed)
 Copay is too high for patient. Office will look into alternative options for her. Dis-enrolling

## 2024-03-09 ENCOUNTER — Other Ambulatory Visit: Payer: Self-pay | Admitting: Medical

## 2024-03-09 DIAGNOSIS — Z1231 Encounter for screening mammogram for malignant neoplasm of breast: Secondary | ICD-10-CM

## 2024-03-24 ENCOUNTER — Ambulatory Visit
Admission: RE | Admit: 2024-03-24 | Discharge: 2024-03-24 | Disposition: A | Discharge status: Home / Self Care | Source: Ambulatory Visit | Attending: Medical | Admitting: Medical

## 2024-03-24 DIAGNOSIS — Z1231 Encounter for screening mammogram for malignant neoplasm of breast: Secondary | ICD-10-CM | POA: Diagnosis not present

## 2024-03-29 ENCOUNTER — Ambulatory Visit: Payer: Self-pay | Admitting: Medical

## 2024-06-07 ENCOUNTER — Ambulatory Visit (INDEPENDENT_AMBULATORY_CARE_PROVIDER_SITE_OTHER)

## 2024-06-07 DIAGNOSIS — Z23 Encounter for immunization: Secondary | ICD-10-CM

## 2024-06-14 ENCOUNTER — Other Ambulatory Visit: Payer: Self-pay | Admitting: Internal Medicine

## 2024-06-17 ENCOUNTER — Other Ambulatory Visit: Payer: Self-pay | Admitting: Medical

## 2024-06-17 DIAGNOSIS — I10 Essential (primary) hypertension: Secondary | ICD-10-CM

## 2024-07-26 ENCOUNTER — Encounter: Payer: Self-pay | Admitting: Internal Medicine

## 2024-07-26 ENCOUNTER — Ambulatory Visit (INDEPENDENT_AMBULATORY_CARE_PROVIDER_SITE_OTHER): Payer: Medicare HMO | Admitting: Internal Medicine

## 2024-07-26 ENCOUNTER — Other Ambulatory Visit: Payer: Self-pay | Admitting: Medical

## 2024-07-26 VITALS — BP 130/82 | HR 69 | Ht 67.0 in | Wt 188.4 lb

## 2024-07-26 DIAGNOSIS — M8580 Other specified disorders of bone density and structure, unspecified site: Secondary | ICD-10-CM | POA: Diagnosis not present

## 2024-07-26 DIAGNOSIS — E559 Vitamin D deficiency, unspecified: Secondary | ICD-10-CM | POA: Diagnosis not present

## 2024-07-26 DIAGNOSIS — E210 Primary hyperparathyroidism: Secondary | ICD-10-CM

## 2024-07-26 DIAGNOSIS — E782 Mixed hyperlipidemia: Secondary | ICD-10-CM

## 2024-07-26 DIAGNOSIS — E041 Nontoxic single thyroid nodule: Secondary | ICD-10-CM | POA: Diagnosis not present

## 2024-07-26 DIAGNOSIS — E890 Postprocedural hypothyroidism: Secondary | ICD-10-CM | POA: Diagnosis not present

## 2024-07-26 MED ORDER — ALENDRONATE SODIUM 70 MG PO TABS: 70.0000 mg | ORAL_TABLET | ORAL | 3 refills | Status: AC

## 2024-07-26 MED ORDER — LEVOTHYROXINE SODIUM 50 MCG PO TABS: 50.0000 ug | ORAL_TABLET | Freq: Every day | ORAL | 3 refills | Status: AC

## 2024-07-26 NOTE — Patient Instructions (Addendum)
 Please continue Levothyroxine  50 mcg daily.  Take the thyroid  hormone every day, with water, at least 30 minutes before breakfast, separated by at least 4 hours from: - acid reflux medications - calcium  - iron - multivitamins  Continue vitamin D  2000 units daily.  You can take a multivitamin for women >50 (e.g. Centrum Silver).  Please come back for a follow-up appointment in 1 year.

## 2024-07-26 NOTE — Progress Notes (Signed)
 Patient ID: Tracy Rowe, female   DOB: February 22, 1946, 78 y.o.   MRN: [id number]   HPI  Tracy Rowe is a 78 y.o. female, returning for follow-up for h/o primary hyperparathyroidism, vitamin D  deficiency, osteopenia, thyroid  nodule, and now postsurgical hypothyroidism diagnosed after last visit. Last visit 1 year ago.  Interim history: No falls or fractures since last visit. She has no complaints today.  Primary hyperparathyroidism: She was diagnosed with hypercalcemia in 09/2016 but she had a technetium sestamibi scan that was positive for parathyroid  adenoma in 2002.  Reviewed pertinent labs: No results found for: CAION Lab Results  Component Value Date   PTH 44 05/12/2022   PTH Comment 05/12/2022   PTH 52 12/23/2017   PTH Comment 12/23/2017   PTH 95 (H) 08/14/2017   PTH Comment 08/14/2017   PTH 55 03/02/2017   PTH 142 (H) 11/26/2016   CALCIUM  9.4 11/16/2023   CALCIUM  9.1 11/12/2022   CALCIUM  9.8 06/30/2022   CALCIUM  9.6 05/27/2022   CALCIUM  9.3 05/12/2022   CALCIUM  9.4 11/07/2021   CALCIUM  9.5 05/06/2021   CALCIUM  9.3 11/08/2020   CALCIUM  9.5 10/31/2020   CALCIUM  9.4 05/03/2020   She had an indeterminate Tc sestamibi scan (2002): NO DEFINITE PARATHYROID  ADENOPATHY LOCALIZED IN THE NECK OR CHEST ALTHOUGH THERE IS A FOCUS OF MILDLY PROMINENT ACTIVITY AT THE INFERIOR POLE OF THE LEFT LOBE OF THE THYROID  WHICH WOULD BE THE MOST LIKELY SITE ON THIS SCAN.  Other labs reviewed: Component     Latest Ref Rng & Units 03/02/2017  Vitamin D  1, 25 (OH) Total     18 - 72 pg/mL 68  Vitamin D3 1, 25 (OH)     pg/mL 44  Vitamin D2 1, 25 (OH)     pg/mL 24  Phosphorus     2.3 - 4.6 mg/dL 2.4  Magnesium     1.5 - 2.5 mg/dL 1.8   Component     Latest Ref Rng & Units 03/04/2017  Creatinine, Urine     20 - 320 mg/dL 63  Creatinine, 75Y Ur     0.63 - 2.50 g/24 h 0.50 (L)  Calcium , Ur     Not estab mg/dL 3  Calcium , 24 hour urine     35 - 250 mg/24 h 24 (L)  Urinary calcium   was low, but the creatinine was also low.  I did not ask her to repeat a collection, since even a higher creatinine level will most likely not push her over the limit for hypercalciuria.    She had the following investigations:  Technetium sestamibi parathyroid  scan (05/05/2017): Left inferior parathyroid  adenoma and a possible right superior parathyroid  adenoma versus thyroid  nodule.   Thyroid  ultrasound (05/20/2017): Several nodules, of which one right superior measuring 1.6 x 1.3 x 1.3, solid, hypoechoic; another left inferior 1.9 x 1.3 x 1.3 cm, mixed, hypoechoic, presumed to be an enlarged parathyroid  gland  FNA of the 1.6 cm nodule (07/08/2017): Scant epithelium (Bethesda category 1)  She had parathyroidectomy + right lobectomy on 09/03/2017.  Left inferior parathyroid  adenoma was resected.  The right superior parathyroid  did not contain an adenoma.  R Thyroid  nodule was benign.  Diagnosis 1. Parathyroid  gland, left inferior - HYPERCELLULAR PARATHYROID  TISSUE CONSISTENT WITH ADENOMA 2. Parathyroid  gland, right superior - PARATHYROID  TISSUE 3. Thyroid , lobectomy, right lobe - BENIGN FOLLICULAR ADENOMA (1.2 CM) The right thyroid  lobe has an adenomatous lesion which has a very thin fibrous capsule consistent with follicular adenoma. There is no  evidence of capsular or vascular invasion. The surrounding thyroid  parenchyma is unremarkable.  Calcium  normalized after surgery.  Osteopenia: Reviewed previous DXA scan reports ([facility]): Date L1-L4 T score FN T score  01/20/2024 L2, L3: -2.0 RFN: -2.2 LFN: -1.8  10/16/2021 L2, L3: -2.2 L1-L3 (L4): -1.7 (+2.5%) RFN: -2.3 LFN: -2.1 Overall: -2.4%  05/25/2019 L2, L3: -2.3 (+1.3%) L1-L3 (L4): -1.9 RFN: -2.1 LFN: -1.9 Overall: +10.8%*  11/19/2016 L2, L3:-2.4 RFN: -2.3 LFN: -2.3  08/09/2007 L1, L3, L4: -2.0 RFN: n/a LFN: -1.8  Per review of the chart, she was approved for Prolia .  We started Fosamax  70 mg weekly in 07/2021.  She  takes this consistently.  No side effects.  No history of kidney stones.  + Mild CKD.  Reviewed BUN/Cr: Lab Results  Component Value Date   BUN 15 11/16/2023   BUN 9 11/12/2022   BUN 18 06/30/2022   BUN 11 05/27/2022   BUN 15 05/12/2022   BUN 15 11/07/2021   BUN 13 05/06/2021   BUN 12 11/08/2020   BUN 12 10/31/2020   BUN 15 05/03/2020   Lab Results  Component Value Date   CREATININE 1.19 (H) 11/16/2023   CREATININE 1.10 (H) 11/12/2022   CREATININE 1.23 (H) 06/30/2022   CREATININE 1.19 (H) 05/27/2022   CREATININE 1.20 (H) 05/12/2022   CREATININE 1.03 (H) 11/07/2021   CREATININE 1.18 (H) 05/06/2021   CREATININE 1.20 (H) 11/08/2020   CREATININE 1.14 (H) 10/31/2020   CREATININE 1.14 (H) 05/03/2020   She was previously on HCTZ in the past but we changed to Lasix  during investigation for hyperparathyroidism.  History of vitamin D  deficiency:  Reviewed her vitamin D  levels: Lab Results  Component Value Date   VD25OH 50.5 01/25/2024   VD25OH 46 07/27/2023   VD25OH 53.74 07/23/2022   VD25OH 44.40 07/23/2021   VD25OH 37.8 05/03/2020   VD25OH 44.6 10/31/2019   VD25OH 37.23 03/01/2019   VD25OH 39.38 12/23/2017   VD25OH 38.66 08/14/2017   VD25OH 26.51 (L) 05/15/2017   She takes 2000 units of vitamin D  daily.  She was previously on ergocalciferol .  Pt does not have a FH of osteoporosis.  She also has HTN.  Postsurgical hypothyroidism  -Developed after her right hemithyroidectomy in 2019  In 02/2019 I advised her to start levothyroxine  25 mcg daily.  She develops hot flashes and stopped.  In 10/2019, TSH was still high so I advised her to restart the lower dose of levothyroxine . She started, but she again developed hot flashes and itching right after taking the levothyroxine  tablet.  We switched to Tirosint  (liquid levothyroxine ) and I advised her to increase the dose to 50 mcg daily.  However, at last visit, she was only taking 12.5 mcg.  I advised her to increase the dose  to 50 mcg daily.  Since then, we had to stop Tirosint  due to insurance coverage and switch to Synthroid  d.a.w. . However, this was $$$ >> we switched to generic in 12/2020.  She takes the levothyroxine  (not on Tirosint  due to $$): - daily - in am - fasting - no coffee - at least 30 min from b'fast - no calcium  - no iron - no multivitamins - no PPIs - not on Biotin - 30 min after Fosamax   Reviewed her TFTs: Lab Results  Component Value Date   TSH 1.720 11/16/2023   TSH 2.320 05/18/2023   TSH 1.730 11/12/2022   TSH 1.490 05/12/2022   TSH 0.54 07/23/2021   TSH 0.47 01/03/2021  TSH 11.06 (H) 02/29/2020   TSH 10.100 (H) 10/31/2019   TSH 11.65 (H) 03/01/2019   TSH 4.42 08/31/2018   FREET4 1.42 11/16/2023   FREET4 1.41 05/18/2023   FREET4 1.42 05/12/2022   FREET4 1.23 07/23/2021   FREET4 1.16 01/03/2021   FREET4 0.90 02/29/2020   FREET4 1.07 10/31/2019   FREET4 0.73 03/01/2019   FREET4 0.89 08/31/2018   FREET4 0.81 05/31/2018   T3FREE 2.6 03/01/2019   T3FREE 2.6 08/31/2018   T3FREE 2.5 05/31/2018   T3FREE 3.2 12/23/2017   ROS: + see HPI  I reviewed pt's medications, allergies, PMH, social hx, family hx, and changes were documented in the history of present illness. Otherwise, unchanged from my initial visit note.  Past Medical History:  Diagnosis Date   Aortic atherosclerosis 06/28/2019   HTN (hypertension)    Hyperlipidemia    Hypertension 09/25/2016   Osteoporosis    Past Surgical History:  Procedure Laterality Date   ABDOMINAL HYSTERECTOMY     PARATHYROIDECTOMY N/A 09/03/2017   Procedure: NECK EXPLORATION WITH LEFT INFERIOR PARATHYROIDECTOMY AND RIGHT SUPERIOR PARATHYROIDECTOMY;  Surgeon: Eletha Boas, MD;  Location: [facility];  Service: General;  Laterality: N/A;   THYROID  LOBECTOMY Right 09/03/2017   Procedure: RIGHT THYROID  LOBECTOMY;  Surgeon: Eletha Boas, MD;  Location: [facility];  Service: General;  Laterality: Right;   Social History   Socioeconomic History    Marital status: Married    Spouse name: Not on file   Number of children: Not on file   Years of education: Not on file   Highest education level: Not on file  Occupational History   Not on file  Tobacco Use   Smoking status: Never   Smokeless tobacco: Never  Vaping Use   Vaping status: Never Used  Substance and Sexual Activity   Alcohol use: No   Drug use: No   Sexual activity: Never  Other Topics Concern   Not on file  Social History Narrative   Not on file   Social Drivers of Health   Financial Resource Strain: Low Risk  (11/17/2023)   Overall Financial Resource Strain (CARDIA)    Difficulty of Paying Living Expenses: Not hard at all  Food Insecurity: No Food Insecurity (11/17/2023)   Hunger Vital Sign    Worried About Running Out of Food in the Last Year: Never true    Ran Out of Food in the Last Year: Never true  Transportation Needs: No Transportation Needs (11/17/2023)   PRAPARE - Administrator, Civil Service (Medical): No    Lack of Transportation (Non-Medical): No  Physical Activity: Inactive (11/17/2023)   Exercise Vital Sign    Days of Exercise per Week: 0 days    Minutes of Exercise per Session: 0 min  Stress: No Stress Concern Present (11/17/2023)   [employer] of Occupational Health - Occupational Stress Questionnaire    Feeling of Stress : Not at all  Social Connections: Moderately Integrated (11/17/2023)   Social Connection and Isolation Panel    Frequency of Communication with Friends and Family: Twice a week    Frequency of Social Gatherings with Friends and Family: Once a week    Attends Religious Services: More than 4 times per year    Active Member of [employer] or Organizations: No    Attends Banker Meetings: Never    Marital Status: Married  Catering Manager Violence: Not At Risk (11/17/2023)   Humiliation, Afraid, Rape, and Kick questionnaire    Fear  of Current or Ex-Partner: No    Emotionally Abused: No    Physically  Abused: No    Sexually Abused: No   Current Outpatient Medications on File Prior to Visit  Medication Sig Dispense Refill   acetaminophen  (TYLENOL ) 500 MG tablet Take 1,000 mg by mouth every 6 (six) hours as needed.     alendronate  (FOSAMAX ) 70 MG tablet TAKE 1 TABLET BY MOUTH ONCE A WEEK ON AN EMPTY STOMACH WITH  A  FULL  GLASS  OF  WATER 12 tablet 0   fluticasone  (FLONASE ) 50 MCG/ACT nasal spray Place 2 sprays into both nostrils daily. 16 g 1   furosemide  (LASIX ) 20 MG tablet Take 1/2 (one-half) tablet by mouth once daily 45 tablet 0   levothyroxine  (SYNTHROID ) 50 MCG tablet Take 1 tablet (50 mcg total) by mouth daily. 90 tablet 3   lisinopril  (ZESTRIL ) 40 MG tablet Take 1 tablet by mouth once daily 90 tablet 1   polyethylene glycol powder (GLYCOLAX /MIRALAX ) powder Take 17 g by mouth 2 (two) times daily as needed. 3350 g 1   Vitamin D , Cholecalciferol , 25 MCG (1000 UT) CAPS Take 2,000 Units by mouth daily. 90 capsule 3   No current facility-administered medications on file prior to visit.   No Known Allergies Family History  Problem Relation Age of Onset   Diabetes Sister    Hypertension Sister    Hypertension Brother    Heart disease Brother    PE: BP 130/82   Pulse 69   Ht 5' 7 (1.702 m)   Wt 188 lb 6.4 oz (85.5 kg)   SpO2 98%   BMI 29.51 kg/m  Wt Readings from Last 3 Encounters:  07/26/24 188 lb 6.4 oz (85.5 kg)  01/25/24 189 lb (85.7 kg)  11/16/23 189 lb 9.6 oz (86 kg)   Constitutional: overweight, in NAD Eyes:  EOMI, no exophthalmos ENT: no neck masses, no cervical lymphadenopathy Cardiovascular: RRR, No MRG Respiratory: CTA B Musculoskeletal: no deformities Skin:no rashes Neurological: no tremor with outstretched hands  Assessment: 1. Hypercalcemia/Primary hyperparathyroidism  2.  Osteopenia  3. Vit D def  4.  Thyroid  nodule  5.  Postsurgical hypothyroidism  Plan: 1. Patient with history of primary hyperparathyroidism with a previous calcium  level  being as high as 11.1 and a PTH as high as 142.  The hyperparathyroidism persisted even after normalization of her vitamin D .  Of note, she does have CKD which can raise the PTH, but not severe enough to cause this degree of hyperparathyroidism.  Investigation pointed towards a left inferior and right superior parathyroid  adenomas.  Thyroid  ultrasound showed a left inferior nodule, most consistent with a parathyroid  adenoma and she had a thyroid  nodule that was found incidentally at the time of her surgery. Thyroid  ultrasound showed a left inferior nodule, most consistent with a parathyroid  adenoma and she had a thyroid  nodule that was incidentally found at the time of her surgery - this was benign. - She had 2 gland parathyroidectomy in 2019-pathology showed that only left inferior gland was adenomatous - PTH a this was benign nd calcium  levels normalized after surgery. Most recent calcium  level was also normal: Lab Results  Component Value Date   CALCIUM  9.4 11/16/2023   PHOS 3.2 11/16/2023  - she will have another calcium  level checked at next visit with PCP in 10/2024  2.  Osteopenia -No falls or fractures since last visit -Reviewed her DXA scan reports from 10/2017 and 04/2019: At the level of the spine,  her T score is approximately the same (increased from -2.4 to -2.3), however, at the level of the femoral necks, bone density increased by 10.8%*. Since her T-scores were in the osteopenic range, we discussed about following her without medications and repeat the bone density scan, after which we can see if she needed antiresorptive medication.  However, DXA scan results from 09/2021 show borderline osteoporosis.  I suggested Fosamax .  She had another bone density scan on 01/20/2024.  This showed osteopenia with essentially stable T-scores. - She continues on Fosamax  70 mg weekly without side effects.  She takes this correctly.  She does not have jaw/thigh/hip pain. - since last visit, she saw  sports medicine-Dr. Ludie Littler who suggested Prolia . She was approved for Prolia  in case she needs to start this, however, she is concerned about not being able to take the medication consistently especially if she has a high co-pay for this.  For now, she opted to continue Fosamax .  I refilled her prescription.  3. Vit D def - Continue 2000 units vitamin D  daily - Her vitamin D  level was normal at 50.5 in 01/2024 - We will recheck her vitamin D  leve at next visit  4. Thyroid  nodule - Per the latest thyroid  ultrasound report from 04/2017, she had a right-sided thyroid  nodule, with the largest dimension being 1.6 cm.  Biopsy of this nodule was inconclusive due to scant epithelial cells and we discussed about repeating the biopsy but she refused to have another biopsy due to discomfort at the time of the first biopsy. - She had right lobectomy at the time of her parathyroid  surgery and the final pathology was benign - No neck compression symptoms - Will continue to follow her clinically  5.  Postsurgical hypothyroidism -Previously uncontrolled -She previously had hot flashes from levothyroxine  so we switched to Tirosint  due to itching with the levothyroxine  tablet.  However, this was not covered so we switched to Synthroid  d.a.w.  Afterwards, since this was also expensive, we retried generic levothyroxine  - latest thyroid  labs reviewed with pt. >> normal: Lab Results  Component Value Date   TSH 1.720 11/16/2023  - she continues on LT4 50 mcg daily - white, without dyes - pt feels good on this dose. - we discussed about taking the thyroid  hormone every day, with water, >30 minutes before breakfast, separated by >4 hours from acid reflux medications, calcium , iron, multivitamins. Pt. is taking it correctly.  She would like to start a multivitamin-discussed which one to start and when to take it. - She prefers to have labs checked by PCP again at her next annual physical exam which is scheduled  for 10/2024. - I refilled her LT4 today  Lela Fendt, MD PhD [facility]

## 2024-09-22 NOTE — Progress Notes (Signed)
 Tracy Rowe                                          MRN: [id number]   09/22/2024   The [facility] Quality Team Specialist reviewed this patient medical record for the purposes of chart review for care gap closure. The following were reviewed: abstraction for care gap closure-controlling blood pressure.    [facility] Quality Team

## 2024-11-22 ENCOUNTER — Ambulatory Visit: Payer: Self-pay

## 2025-07-26 ENCOUNTER — Ambulatory Visit: Admitting: Internal Medicine
